# Patient Record
Sex: Female | Born: 1980 | Race: White | Hispanic: Yes | Marital: Single | Smoking: Former smoker
Health system: Southern US, Community
[De-identification: ages and names within clinical notes are randomized; demographics above are authoritative.]

## PROBLEM LIST (undated history)

## (undated) DIAGNOSIS — E785 Hyperlipidemia, unspecified: Secondary | ICD-10-CM

## (undated) DIAGNOSIS — D649 Anemia, unspecified: Secondary | ICD-10-CM

## (undated) DIAGNOSIS — D1771 Benign lipomatous neoplasm of kidney: Secondary | ICD-10-CM

## (undated) DIAGNOSIS — J302 Other seasonal allergic rhinitis: Secondary | ICD-10-CM

## (undated) HISTORY — PX: INDUCED ABORTION: SHX677

## (undated) HISTORY — DX: Hyperlipidemia, unspecified: E78.5

## (undated) HISTORY — DX: Benign lipomatous neoplasm of kidney: D17.71

## (undated) HISTORY — DX: Other seasonal allergic rhinitis: J30.2

## (undated) HISTORY — PX: DILATION AND CURETTAGE OF UTERUS: SHX78

---

## 2002-03-20 ENCOUNTER — Emergency Department: Admit: 2002-03-20 | Payer: Self-pay | Source: Emergency Department | Admitting: Emergency Medicine

## 2003-03-01 ENCOUNTER — Emergency Department: Admit: 2003-03-01 | Payer: Self-pay | Source: Emergency Department | Admitting: Emergency Medicine

## 2007-06-11 ENCOUNTER — Emergency Department: Admit: 2007-06-11 | Payer: Self-pay | Source: Emergency Department | Admitting: Emergency Medicine

## 2007-11-19 ENCOUNTER — Emergency Department: Admit: 2007-11-19 | Payer: Self-pay | Source: Emergency Department | Admitting: Emergency Medicine

## 2008-04-15 ENCOUNTER — Emergency Department: Admit: 2008-04-15 | Payer: Self-pay | Source: Emergency Department | Admitting: Emergency Medicine

## 2008-04-15 LAB — CBC AND DIFFERENTIAL
Basophils Absolute: 0 /mm3 (ref 0.0–0.2)
Basophils: 0 % (ref 0–2)
Eosinophils Absolute: 0.2 /mm3 (ref 0.0–0.7)
Eosinophils: 2 % (ref 0–5)
Granulocytes Absolute: 4.5 /mm3 (ref 1.8–8.1)
Hematocrit: 40.4 % (ref 37.0–47.0)
Hgb: 14.1 G/DL (ref 12.0–16.0)
Lymphocytes Absolute: 2.2 /mm3 (ref 0.5–4.4)
Lymphocytes: 29 % (ref 15–41)
MCH: 30.6 PG (ref 28.0–32.0)
MCHC: 34.9 G/DL (ref 32.0–36.0)
MCV: 87.6 FL (ref 80.0–100.0)
MPV: 9.2 FL — ABNORMAL LOW (ref 9.4–12.3)
Monocytes Absolute: 0.6 /mm3 (ref 0.0–1.2)
Monocytes: 8 % (ref 0–11)
Neutrophils %: 60 % (ref 52–75)
Platelets: 362 /mm3 (ref 140–400)
RBC: 4.61 /mm3 (ref 4.20–5.40)
RDW: 13 % (ref 11.5–15.0)
WBC: 7.49 /mm3 (ref 3.50–10.80)

## 2008-04-15 LAB — URINALYSIS WITH MICROSCOPIC
Blood, UA: NEGATIVE
Glucose, UA: NEGATIVE
Leukocyte Esterase, UA: NEGATIVE
Nitrite, UA: NEGATIVE
Protein, UR: NEGATIVE
Specific Gravity UA POCT: >=1.03 (ref <=1.030)
Urine pH: 5.5 (ref 5.0–8.0)
Urobilinogen, UA: 0.2

## 2008-04-15 LAB — URINE ICTOTEST: Urine Ictotest: NEGATIVE

## 2008-04-15 LAB — BASIC METABOLIC PANEL
BUN: 8 MG/DL (ref 7–21)
CO2: 27 MEQ/L (ref 22–31)
Calcium: 9.3 MG/DL (ref 8.6–10.2)
Chloride: 103 MEQ/L (ref 98–107)
Creatinine: 0.8 mg/dL (ref 0.6–1.5)
Glucose: 86 MG/DL (ref 65–110)
Potassium: 3.9 MEQ/L (ref 3.6–5.0)
Sodium: 135 MEQ/L — ABNORMAL LOW (ref 136–143)

## 2008-04-15 LAB — URINE HCG QUALITATIVE: Urine HCG Qualitative: POSITIVE

## 2008-04-15 LAB — HCG QUANTITATIVE: hCG, Quant.: 391 m[IU]/mL — ABNORMAL HIGH (ref 0–10)

## 2009-11-18 ENCOUNTER — Emergency Department: Admit: 2009-11-18 | Payer: Self-pay | Source: Emergency Department | Admitting: Emergency Medicine

## 2010-08-06 ENCOUNTER — Emergency Department
Admit: 2010-08-06 | Disposition: A | Discharge status: Home / Self Care | Payer: Self-pay | Source: Emergency Department | Admitting: Emergency Medicine

## 2011-06-08 ENCOUNTER — Emergency Department
Admit: 2011-06-08 | Discharge: 2011-06-08 | Disposition: A | Discharge status: Home / Self Care | Payer: Self-pay | Source: Emergency Department

## 2011-06-08 LAB — CBC AND DIFFERENTIAL
Basophils Absolute Automated: 0.01 10*3/uL (ref 0.00–0.20)
Basophils Automated: 0 % (ref 0–2)
Eosinophils Absolute Automated: 0.03 10*3/uL (ref 0.00–0.70)
Eosinophils Automated: 1 % (ref 0–5)
Hematocrit: 42.3 % (ref 37.0–47.0)
Hgb: 14.3 g/dL (ref 12.0–16.0)
Lymphocytes Absolute Automated: 0.86 10*3/uL (ref 0.50–4.40)
Lymphocytes Automated: 35 % (ref 15–41)
MCH: 29.3 pg (ref 28.0–32.0)
MCHC: 33.8 g/dL (ref 32.0–36.0)
MCV: 86.7 fL (ref 80.0–100.0)
MPV: 9 fL — ABNORMAL LOW (ref 9.4–12.3)
Monocytes Absolute Automated: 0.36 10*3/uL (ref 0.00–1.20)
Monocytes: 15 % — ABNORMAL HIGH (ref 0–11)
Neutrophils Absolute: 1.2 10*3/uL — ABNORMAL LOW (ref 1.80–8.10)
Neutrophils: 49 % — ABNORMAL LOW (ref 52–75)
Platelets: 242 10*3/uL (ref 140–400)
RBC: 4.88 10*6/uL (ref 4.20–5.40)
RDW: 14 % (ref 12–15)
WBC: 2.46 10*3/uL — ABNORMAL LOW (ref 3.50–10.80)

## 2011-06-08 LAB — BASIC METABOLIC PANEL
BUN: 7 mg/dL (ref 7–21)
CO2: 26 mEq/L (ref 22–31)
Calcium: 8.8 mg/dL (ref 8.6–10.2)
Chloride: 102 mEq/L (ref 98–107)
Creatinine: 0.7 mg/dL (ref 0.5–1.4)
Glucose: 101 mg/dL — ABNORMAL HIGH (ref 70–100)
Potassium: 3.9 mEq/L (ref 3.6–5.0)
Sodium: 141 mEq/L (ref 136–143)

## 2011-06-08 LAB — GFR: EGFR: >60

## 2011-06-08 LAB — URINE HCG QUALITATIVE: Urine HCG Qualitative: NEGATIVE

## 2013-05-21 ENCOUNTER — Encounter
Admission: RE | Disposition: A | Discharge status: Home / Self Care | Payer: Self-pay | Source: Ambulatory Visit | Attending: Obstetrics & Gynecology

## 2013-05-21 ENCOUNTER — Encounter: Payer: Self-pay | Admitting: Certified Registered"

## 2013-05-21 ENCOUNTER — Ambulatory Visit: Payer: PRIVATE HEALTH INSURANCE | Admitting: Obstetrics & Gynecology

## 2013-05-21 ENCOUNTER — Ambulatory Visit: Payer: PRIVATE HEALTH INSURANCE | Admitting: Certified Registered"

## 2013-05-21 ENCOUNTER — Ambulatory Visit: Payer: Self-pay

## 2013-05-21 ENCOUNTER — Ambulatory Visit
Admission: RE | Admit: 2013-05-21 | Discharge: 2013-05-21 | Disposition: A | Discharge status: Home / Self Care | Payer: PRIVATE HEALTH INSURANCE | Source: Ambulatory Visit | Attending: Obstetrics & Gynecology | Admitting: Obstetrics & Gynecology

## 2013-05-21 DIAGNOSIS — F17200 Nicotine dependence, unspecified, uncomplicated: Secondary | ICD-10-CM | POA: Insufficient documentation

## 2013-05-21 DIAGNOSIS — Z880 Allergy status to penicillin: Secondary | ICD-10-CM | POA: Insufficient documentation

## 2013-05-21 DIAGNOSIS — N871 Moderate cervical dysplasia: Secondary | ICD-10-CM

## 2013-05-21 DIAGNOSIS — N72 Inflammatory disease of cervix uteri: Secondary | ICD-10-CM

## 2013-05-21 DIAGNOSIS — R87613 High grade squamous intraepithelial lesion on cytologic smear of cervix (HGSIL): Secondary | ICD-10-CM | POA: Diagnosis present

## 2013-05-21 HISTORY — PX: LEEP LLETZ: SHX4695

## 2013-05-21 LAB — POCT PREGNANCY TEST, URINE HCG: POCT Pregnancy HCG Test, UR: NEGATIVE

## 2013-05-21 SURGERY — CONE BIOPSY, CERVIX, LEEP LLETZ
Anesthesia: Anesthesia General | Site: Pelvis | Wound class: Clean Contaminated

## 2013-05-21 MED ORDER — LIDOCAINE HCL (PF) 2 % IJ SOLN
INTRAMUSCULAR | Status: AC
Start: 2013-05-21 — End: ?
  Filled 2013-05-21: qty 5

## 2013-05-21 MED ORDER — LACTATED RINGERS IV SOLN
INTRAVENOUS | Status: DC | PRN
Start: 2013-05-21 — End: 2013-05-21

## 2013-05-21 MED ORDER — MIDAZOLAM HCL 2 MG/2ML IJ SOLN
INTRAMUSCULAR | Status: DC | PRN
Start: 2013-05-21 — End: 2013-05-21
  Administered 2013-05-21: 2 mg via INTRAVENOUS

## 2013-05-21 MED ORDER — KETOROLAC TROMETHAMINE 60 MG/2ML IM SOLN
INTRAMUSCULAR | Status: AC
Start: 2013-05-21 — End: ?
  Filled 2013-05-21: qty 2

## 2013-05-21 MED ORDER — PROPOFOL 10 MG/ML IV EMUL
INTRAVENOUS | Status: AC
Start: 2013-05-21 — End: ?
  Filled 2013-05-21: qty 20

## 2013-05-21 MED ORDER — LIDOCAINE HCL 2 % IJ SOLN
INTRAMUSCULAR | Status: DC | PRN
Start: 2013-05-21 — End: 2013-05-21
  Administered 2013-05-21: 100 mg

## 2013-05-21 MED ORDER — FENTANYL CITRATE 0.05 MG/ML IJ SOLN
INTRAMUSCULAR | Status: DC | PRN
Start: 2013-05-21 — End: 2013-05-21
  Administered 2013-05-21 (×2): 25 ug via INTRAVENOUS
  Administered 2013-05-21: 50 ug via INTRAVENOUS

## 2013-05-21 MED ORDER — PROPOFOL INFUSION 10 MG/ML
INTRAVENOUS | Status: DC | PRN
Start: 2013-05-21 — End: 2013-05-21
  Administered 2013-05-21: 40 mg via INTRAVENOUS
  Administered 2013-05-21: 200 mg via INTRAVENOUS

## 2013-05-21 MED ORDER — DEXAMETHASONE SOD PHOSPHATE PF 10 MG/ML IJ SOLN
INTRAMUSCULAR | Status: AC
Start: 2013-05-21 — End: ?
  Filled 2013-05-21: qty 1

## 2013-05-21 MED ORDER — KETOROLAC TROMETHAMINE 30 MG/ML IJ SOLN
INTRAMUSCULAR | Status: DC | PRN
Start: 2013-05-21 — End: 2013-05-21
  Administered 2013-05-21: 30 mg via INTRAVENOUS

## 2013-05-21 MED ORDER — MIDAZOLAM HCL 2 MG/2ML IJ SOLN
INTRAMUSCULAR | Status: AC
Start: 2013-05-21 — End: ?
  Filled 2013-05-21: qty 2

## 2013-05-21 MED ORDER — ONDANSETRON HCL 4 MG/2ML IJ SOLN
INTRAMUSCULAR | Status: AC
Start: 2013-05-21 — End: ?
  Filled 2013-05-21: qty 2

## 2013-05-21 MED ORDER — IODINE STRONG 5 % PO SOLN
ORAL | Status: DC | PRN
Start: 2013-05-21 — End: 2013-05-21
  Administered 2013-05-21: 5 mL via ORAL

## 2013-05-21 MED ORDER — FENTANYL CITRATE 0.05 MG/ML IJ SOLN
25.0000 ug | INTRAMUSCULAR | Status: DC | PRN
Start: 2013-05-21 — End: 2013-05-21

## 2013-05-21 MED ORDER — LIDOCAINE-EPINEPHRINE 1 %-1:100000 IJ SOLN
INTRAMUSCULAR | Status: AC
Start: 2013-05-21 — End: ?
  Filled 2013-05-21: qty 20

## 2013-05-21 MED ORDER — ACETIC ACID 5% IRRIGATION
Status: DC | PRN
Start: 2013-05-21 — End: 2013-05-21
  Administered 2013-05-21: 5 mL

## 2013-05-21 MED ORDER — ONDANSETRON HCL 4 MG/2ML IJ SOLN
INTRAMUSCULAR | Status: DC | PRN
Start: 2013-05-21 — End: 2013-05-21
  Administered 2013-05-21: 4 mg via INTRAVENOUS

## 2013-05-21 MED ORDER — GLYCOPYRROLATE 0.2 MG/ML IJ SOLN
INTRAMUSCULAR | Status: AC
Start: 2013-05-21 — End: ?
  Filled 2013-05-21: qty 1

## 2013-05-21 MED ORDER — ONDANSETRON HCL 4 MG/2ML IJ SOLN
4.0000 mg | Freq: Once | INTRAMUSCULAR | Status: DC | PRN
Start: 2013-05-21 — End: 2013-05-21

## 2013-05-21 MED ORDER — FENTANYL CITRATE 0.05 MG/ML IJ SOLN
INTRAMUSCULAR | Status: AC
Start: 2013-05-21 — End: ?
  Filled 2013-05-21: qty 2

## 2013-05-21 MED ORDER — OXYCODONE-ACETAMINOPHEN 5-325 MG PO TABS
1.0000 | ORAL_TABLET | Freq: Once | ORAL | Status: DC | PRN
Start: 2013-05-21 — End: 2013-05-21

## 2013-05-21 MED ORDER — IBUPROFEN 800 MG PO TABS
800.0000 mg | ORAL_TABLET | Freq: Four times a day (QID) | ORAL | Status: DC | PRN
Start: 2013-05-21 — End: 2013-09-16

## 2013-05-21 MED ORDER — EPHEDRINE SULFATE 50 MG/ML IJ SOLN
INTRAMUSCULAR | Status: DC | PRN
Start: 2013-05-21 — End: 2013-05-21
  Administered 2013-05-21: 5 mg via INTRAVENOUS
  Administered 2013-05-21: 10 mg via INTRAVENOUS

## 2013-05-21 MED ORDER — FERRIC SUBSULFATE 259 MG/GM EX SOLN
CUTANEOUS | Status: DC | PRN
Start: 2013-05-21 — End: 2013-05-21
  Administered 2013-05-21: 6 mL via TOPICAL

## 2013-05-21 MED ORDER — LACTATED RINGERS IV SOLN
INTRAVENOUS | Status: DC
Start: 2013-05-21 — End: 2013-05-21

## 2013-05-21 MED ORDER — DEXAMETHASONE SODIUM PHOSPHATE 4 MG/ML IJ SOLN (WRAP)
INTRAMUSCULAR | Status: DC | PRN
Start: 2013-05-21 — End: 2013-05-21
  Administered 2013-05-21: 8 mg via INTRAVENOUS

## 2013-05-21 MED ORDER — HYDROMORPHONE HCL PF 1 MG/ML IJ SOLN
0.5000 mg | INTRAMUSCULAR | Status: DC | PRN
Start: 2013-05-21 — End: 2013-05-21

## 2013-05-21 MED ORDER — LIDOCAINE-EPINEPHRINE 1 %-1:100000 IJ SOLN
INTRAMUSCULAR | Status: DC | PRN
Start: 2013-05-21 — End: 2013-05-21
  Administered 2013-05-21: 5 mL

## 2013-05-21 MED ORDER — FERRIC SUBSULFATE 259 MG/GM EX SOLN
CUTANEOUS | Status: AC
Start: 2013-05-21 — End: ?
  Filled 2013-05-21: qty 8000

## 2013-05-21 MED ORDER — PROMETHAZINE HCL 25 MG/ML IJ SOLN
6.2500 mg | Freq: Once | INTRAMUSCULAR | Status: DC | PRN
Start: 2013-05-21 — End: 2013-05-21

## 2013-05-21 SURGICAL SUPPLY — 55 items
CATH URETHRAL RED RUBBER 16F (Catheter Urine) IMPLANT
CONTAINER SPECIMEN 4 OZ STRL (Procedure Accessories) ×2 IMPLANT
DCNTR FLD TRNSF DEV STRL LF DISP (IV Supply) ×1
DECANTER FLD LF STRL TRNSF DEV DISP (IV Supply) ×1
DECANTER FLUID TRANSFER DEVICE DISPOSABLE STERILE LATEX FREE (IV Supply) ×1 IMPLANT
DRESSING TELFA 3X8IN STERILE (Dressing) ×2 IMPLANT
ELECTRODE ELECTROSURGICAL BALL L11 CM (Cautery) ×1
ELECTRODE ELECTROSURGICAL BALL L11 CM OD5 MM ODSEC3/32 IN UTAHBALL (Cautery) ×1 IMPLANT
ELECTRODE ELECTROSURGICAL BLADE PENCIL (Cautery) ×1
ELECTRODE ELECTROSURGICAL BLADE PENCIL L10 FT VALLEYLAB E2515H (Cautery) ×1 IMPLANT
ELECTRODE ELECTROSURGICAL D1 CM (Cautery)
ELECTRODE ELECTROSURGICAL D1 CM UNIVERSAL SQUARE L12 CM X W1 CM OD3/32 (Cautery) IMPLANT
ELECTRODE ELECTROSURGICAL ROUND LOOP L10 (Instrument)
ELECTRODE ELECTROSURGICAL ROUND LOOP L10 MM X W10 MM OD3/32 IN (Instrument) IMPLANT
ELECTRODE ELECTROSURGICAL ROUND LOOP L11 (Cautery) ×1
ELECTRODE ELECTROSURGICAL ROUND LOOP L11 CM OD8 MM UTAHLOOP (Cautery) IMPLANT
ELECTRODE ELECTROSURGICAL ROUND LOOP L12 (Cautery)
ELECTRODE ELECTROSURGICAL ROUND LOOP L12 MM X W15 MM OD3/32 IN (Cautery) IMPLANT
ELECTRODE ELECTROSURGICAL ROUND LOOP L12 MM X W20 MM OD3/32 IN (Cautery) IMPLANT
ELECTRODE ESURG RND LOOP UTAHLOOP SFTGG (Cautery) ×1
ELECTRODE ESURG SS BALL UTAHBALL 5MM (Cautery) ×1
ELECTRODE ESURG SS BLDE PNCL VLAB 10FT (Cautery) ×1
ELECTRODE ESURG SS TUNG PLS RND LOOP (Cautery)
ELECTRODE ESURG SS TUNG PLS RND LOOP (Instrument)
ELECTRODE ESURG SS TUNG RND LOOP (Cautery)
ELECTRODE ESURG TUNG 1CM UNV SQ 3/32IN (Cautery)
GLOVE SURG BIOGEL INDIC SZ 6.5 (Glove) ×2 IMPLANT
GLOVE SURG BIOGEL PF LTX SZ6.0 (Glove) ×2 IMPLANT
GOWN OPTIMA STRL BACK OR (Gown) ×2 IMPLANT
KIT SURG INCL NEEDLE CN (Kits) ×2 IMPLANT
NEEDLE SPINAL BD OD22 GA L3 1/2 IN (Needles) ×1
NEEDLE SPINAL L3 1/2 IN REGULAR WALL QUINCKE TIP OD22 GA BD (Needles) ×1 IMPLANT
NEEDLE SPNL PP RW BD QNCK 22GA 3.5IN LF (Needles) ×1
PACK LITHOTOMY (Pack) ×2 IMPLANT
PAD ELECTROSRG GRND REM W CRD (Procedure Accessories) ×2 IMPLANT
PAD PREP CUFF 24X41IN W 9IN (Prep) ×2 IMPLANT
PAD SANITARY L12.25 IN X W4.25 IN HEAVY ABSORBENT MOISTURE BARRIER (Dressing) ×1 IMPLANT
PAD SNTR SLK FLF CRTY 12.25X4.25IN LF NS (Dressing) ×2
SPONGE SRG VISTEC 8X4IN LF STRL 12 PLY (Sponge) ×1
SPONGE SURGICAL L8 IN X W4 IN 12 PLY (Sponge) ×1
SPONGE SURGICAL L8 IN X W4 IN 12 PLY RADIOPAQUE BAND VISTEC BLUE WHITE (Sponge) ×1 IMPLANT
SWAB CULT PLS RYN 8IN LF STRL CSTM (Applicator) ×2
SWAB CULTURE L8 IN CUSTOM PLASTIC RAYON (Applicator) ×2
SWAB CULTURE L8 IN CUSTOM PROCTOSWAB PLASTIC RAYON PROCTOSCOPE (Applicator) ×2 IMPLANT
SYRINGE 10 ML CONTROL CONCENTRIC TIP (Syringes, Needles) ×1
SYRINGE 10 ML CONTROL CONCENTRIC TIP PYROGEN FREE DEHP FREE LOK (Syringes, Needles) ×1 IMPLANT
SYRINGE MED 10ML LL LF STRL CNTRL CONC (Syringes, Needles) ×1
TOWEL L26 IN X W17 IN COTTON PREWASH (Procedure Accessories) ×2
TOWEL L26 IN X W17 IN COTTON PREWASH DELINT BLUE ACTISORB SURGICAL (Procedure Accessories) ×1 IMPLANT
TOWEL SRG CTTN 26X17IN LF STRL PREWASH (Procedure Accessories) ×2
TRAY SKIN BETANDINE PREP (Tray) ×2 IMPLANT
TUBING LASER NONSTERILE 6FT (Ortho Supply) ×1 IMPLANT
TUBING SCT MDVC MXGR 9/32IN 12FT LF STRL (Suction) ×1
TUBING SUCTION ID9/32 IN L12 FT (Suction) ×1
TUBING SUCTION ID9/32 IN L12 FT NONCONDUCTIVE MALE TO MALE CONNECTOR (Suction) ×1 IMPLANT

## 2013-05-21 NOTE — Op Note (Signed)
Operative Report    Date of Procedure:  05/21/2013    Patient Name:  Judy Aguirre    Pre-Op Dx:    1. ASCUS papsmear with + HR HPV  2. 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 ASCUS papsmear with + HR HPV  2. 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         Procedure:  After informed consent was obtained, the patient was taken to the operating room where anesthesia was obtained without difficulty.  She was draped in the usual fashion in the dorsal lithotomy position in Manele stirrups.  A bivalve speculum was placed in the vagina and Lugol's solution was painted on the cervix with findings as stated above.  1%lidocaine with epinerphrine was injected submucosally into the surface of the cervix (ectocervix) at the 2, 4, 8, and 10 o'clock positions.   Loop electro-surgical excision procedure was then performed, followed by ECC sampling. Hemostasis was obtained with the Ball cautery and Monsel's solution.  All instruments were removed from the vagina.  The patient tolerated the procedure well.  All instrument, sponge, and needle counts were correct times two.  She was taken to the recovery room in stable [place] [id number]

## 2013-05-21 NOTE — H&P (Signed)
PRE-OP HISTORY AND PHYSICAL EXAM    Date Time: 05/21/2013 2:53 PM  Patient Name: Judy Aguirre  Attending Physician: Jeanie Cooks, MD    CC: CIN II colpo    History of Presenting Illness:   Judy Aguirre is a 33 y.o. female who presents to the hospital for scheduled LEEP. Patient     Past Medical History:     Past Medical History   Diagnosis Date   . Abnormal vision      wears glasses       Past Surgical History:     Past Surgical History   Procedure Date   . Vaginal delivery 2000   . Induced abortion      x2       OB History:     LMP: Nov 2014  G:3 P:1021  Sab: 0 Tab:1 BC/HRT: condoms for contraception (notes using condoms consistently)      Medications / Herbals / OTC:     No prescriptions prior to admission       Allergies:     Allergies   Allergen Reactions   . Penicillins Other (See Comments)     Was told by parent she was allergic as child         Psychosocial / Family History:     History     Social History   . Marital Status: Single     Spouse Name: N/A     Number of Children: N/A   . Years of Education: N/A     Social History Main Topics   . Smoking status: Current Some Day Smoker -- 3 years   . Smokeless tobacco: Not on file   . Alcohol Use: Yes      Comment: 2-3 times a week-3-4 drinks   . Drug Use: Yes      Comment: marijuana once a month   . Sexually Active:      Other Topics Concern   . Not on file     Social History Narrative   . No narrative on file       History reviewed. No pertinent family history.      Physical Exam:     Filed Vitals:    05/21/13 1213   BP: 101/57   Pulse: 71   Temp: 97.6 F (36.4 C)   Resp: 16   SpO2: 100%       Gen: NAD  Lungs: CTAB  Cardiac: normal rate, regular rhythm  Abdomen: soft, nontender      Assessment:   32yo [id number] with ASCUS papsmear/+ HPV with CIN II colposcopy.    Plan:   - discussed LEEP procedure including indication, anticipated course, risks and alternatives to the proposed surgery, including the consequences of not having the surgery.   -Good  understanding of these considerations has been demonstrated and she wishes to proceed.    - Consent signed and in chart.      Signed by: Jeanie Cooks, MD

## 2013-05-21 NOTE — Discharge Summary (Signed)
Patient underwent LEEP procedure without any complications.

## 2013-05-21 NOTE — Progress Notes (Signed)
Report given to Danielle to transport pt to phase 2.

## 2013-05-21 NOTE — Anesthesia Preprocedure Evaluation (Signed)
Anesthesia Evaluation    AIRWAY    Mallampati: II    TM distance: >3 FB  Neck ROM: full  Mouth Opening:full   CARDIOVASCULAR    cardiovascular exam normal, regular and normal       DENTAL    No notable dental hx     PULMONARY    pulmonary exam normal and clear to auscultation     OTHER FINDINGS    Patient seen and evaluated.  No significant past medical history.  No previous problems with anesthesia.          PSS Anesthesia Comments:          Anesthesia Plan    ASA 1     general               (Discussed with patient GA either IV or with LMA/ETT as indicated with risk to include but not limited to N/V, H/A, sore throat.  Questions answered. )      intravenous induction   Detailed anesthesia plan: general LMA and general endotracheal  Monitors/Adjuncts: other (BP cuff, pulse oximeter, EKG, ETCO2)    Post Op: other (PACU)  Post op pain management: per surgeon    informed consent obtained    Plan discussed with CRNA.      pertinent labs reviewed

## 2013-05-21 NOTE — Discharge Instructions (Signed)
Discharge Instructions for LEEP  Your doctor performed a loop electrosurgical excision procedure (LEEP). The reason for having this procedure is to remove abnormal cells from the cervix.   Home Care   Take it easy.    Return to your normal activities after 24-48 hours. You may also return to work at that time.   Eat a normal diet.   Take prescribed pain reliever for pain, if needed.   Remember, it's okay to have vaginal discharge (watery or brown) and light bleeding for about 2-3 weeks after the procedure.    Don't lift anything heavier than 10 pounds for 1 week after the procedure.   Don't drive for 24 hours after the procedure.   Don't have sexual intercourse or use tampons or douches until your doctor says it's safe to do so. This usually takes 2 weeks.  Follow-Up   Make a follow-up appointment in 2 weeks.    When to Call Your Doctor  Call your doctor immediately if you have any of the following:   Bleeding that soaks more than one sanitary pad in one hour   Severe abdominal pain   Severe cramps   Fever above 100.44F   Chills   Smelly discharge from your vagina    [address], [address] [place], [place] [address]. All rights reserved. This information is not intended as a substitute for professional medical care. Always follow your healthcare professional's instructions.      Post Anesthesia Discharge Instructions    Although you may be awake and alert in the recovery room, small amounts of anesthetic remain in your system for about 24 hours.  You may feel tired and sleepy during this time.      You are advised to go directly home from the hospital.    Plan to stay at home and rest for the remainder of the day.    It is advisable to have someone with you at home for 24 hours after surgery.    Do not operate a motor vehicle, or any mechanical or electrical equipment for the next 24 hours.      Be careful when you are walking around, you may become dizzy.  The effects of  anesthesia and/or medications are still present and drowsiness may occur    Do not consume alcohol, tranquilizers, sleeping medications, or any other non prescribed medication for the remainder of the day.    Diet:  begin with liquids, progress your diet as tolerated or as directed by your surgeon.  Nausea and vomiting may occur in the next 24 hours.

## 2013-05-21 NOTE — Transfer of Care (Signed)
Anesthesia Transfer of Care Note    Patient: Judy Aguirre    Procedures performed: Procedure(s) with comments:  LEEP LLETZ    Anesthesia type: General LMA    Patient location:Phase I PACU    Last vitals:   Filed Vitals:    05/21/13 1619   BP: 107/58   Pulse: 95   Temp: 97.8 F (36.6 C)   Resp: 12   SpO2: 100%       Post pain: Patient not complaining of pain, continue current therapy      Mental Status:lethargic    Respiratory Function: tolerating nasal cannula    Cardiovascular: stable    Nausea/Vomiting: patient not complaining of nausea or vomiting    Hydration Status: adequate    Post assessment: no apparent anesthetic complications, no reportable events and no evidence of recall    Report to rn, vss

## 2013-05-21 NOTE — Anesthesia Postprocedure Evaluation (Signed)
Anesthesia Post Evaluation    Patient: Judy Aguirre    Procedures performed: Procedure(s) with comments:  LEEP LLETZ    Anesthesia type: General LMA    Patient location:PACU    Last vitals:   Filed Vitals:    05/21/13 1635   BP: 116/64   Pulse: 97   Temp:    Resp: 17   SpO2: 100%       Post pain: Patient not complaining of pain, continue current therapy      Mental Status:awake    Respiratory Function: tolerating room air    Cardiovascular: stable    Nausea/Vomiting: patient not complaining of nausea or vomiting    Hydration Status: adequate    Post assessment: no apparent anesthetic complications

## 2013-05-22 ENCOUNTER — Encounter: Payer: Self-pay | Admitting: Obstetrics & Gynecology

## 2013-05-22 MED FILL — Ferric Subsulfate Soln 259 MG/GM: CUTANEOUS | Qty: 8000 | Status: AC

## 2013-05-23 LAB — LAB USE ONLY - HISTORICAL SURGICAL PATHOLOGY

## 2013-09-16 ENCOUNTER — Emergency Department: Payer: Charity

## 2013-09-16 ENCOUNTER — Emergency Department
Admission: EM | Admit: 2013-09-16 | Discharge: 2013-09-16 | Disposition: A | Discharge status: Home / Self Care | Payer: Charity | Attending: Emergency Medicine | Admitting: Emergency Medicine

## 2013-09-16 DIAGNOSIS — J40 Bronchitis, not specified as acute or chronic: Secondary | ICD-10-CM | POA: Insufficient documentation

## 2013-09-16 LAB — GROUP A STREP, RAPID ANTIGEN: Group A Strep, Rapid Antigen: NEGATIVE

## 2013-09-16 MED ORDER — BENZONATATE 100 MG PO CAPS
200.0000 mg | ORAL_CAPSULE | Freq: Three times a day (TID) | ORAL | Status: DC | PRN
Start: 2013-09-16 — End: 2015-12-09

## 2013-09-16 MED ORDER — GUAIFENESIN-CODEINE 100-10 MG/5ML PO SYRP
5.0000 mL | ORAL_SOLUTION | Freq: Three times a day (TID) | ORAL | Status: DC | PRN
Start: 2013-09-16 — End: 2015-12-09

## 2013-09-16 MED ORDER — IBUPROFEN 600 MG PO TABS
600.0000 mg | ORAL_TABLET | Freq: Four times a day (QID) | ORAL | Status: DC | PRN
Start: 2013-09-16 — End: 2015-12-09

## 2013-09-16 MED ORDER — IBUPROFEN 600 MG PO TABS
600.0000 mg | ORAL_TABLET | Freq: Once | ORAL | Status: AC
Start: 2013-09-16 — End: 2013-09-16
  Administered 2013-09-16: 600 mg via ORAL
  Filled 2013-09-16: qty 1

## 2013-09-16 MED ORDER — LIDOCAINE VISCOUS 2 % MT SOLN
10.0000 mL | Freq: Once | OROMUCOSAL | Status: AC
Start: 2013-09-16 — End: 2013-09-16
  Administered 2013-09-16: 10 mL via OROMUCOSAL
  Filled 2013-09-16: qty 15

## 2013-09-16 NOTE — Discharge Instructions (Signed)
Bronchitis    You have been diagnosed with bronchitis.    Bronchitis is an irritation of the large breathing tubes. It can be caused by tobacco smoke, air pollution, or an infection. Patients with bronchitis are short of breath and may cough up green or yellow mucous. These symptoms are usually worse at night, when lying flat and in wet weather. Most people with bronchitis do not need antibiotics. If your doctor prescribes antibiotics, fill the prescription and take all the medicine according to the instructions.    Bronchitis is usually treated with medicine to help stop coughing. An inhaler with albuterol (Ventolin/Proventil) is sometimes used to help with cough. It is best to use the inhaler with a spacer to help the medicine reach your lungs. The doctor can prescribe a spacer.    Bronchitis is usually caused by a virus. Antibiotics do not kill viruses. In fact, antibiotics do not affect viruses in any way. In the past, some doctors prescribed antibiotics for people with bronchitis. We now know that antibiotics are not helpful for most bronchitis patients. Patients who might need antibiotics are those with lung problems that don't go away, like emphysema or COPD.    Your coughing and wheezing might last for 2 or 3 weeks! The symptoms should get better over this time period and not worse.    Do not smoke. Research shows that smoking causes heart disease, cancer, and birth defects. Avoiding smoking will help your asthma. If you smoke, ask your doctor for ideas about how to stop.   If you do not smoke, avoid others who do.    YOU SHOULD SEEK MEDICAL ATTENTION IMMEDIATELY, EITHER HERE OR AT THE NEAREST EMERGENCY DEPARTMENT, IF ANY OF THE FOLLOWING OCCURS:   You wheeze or have trouble breathing.   You have a fever (temperature higher than 100.4F / 38C), that won't go away.   You have chest pain.   You vomit or cannot keep liquids down or you feel weak or dizzy.   Your symptoms get worse over the next  2 or 3 days.

## 2013-09-16 NOTE — ED Provider Notes (Signed)
EMERGENCY DEPARTMENT HISTORY AND PHYSICAL EXAM     Physician/Midlevel provider first contact with patient: 09/16/13 2032         Date: 09/16/2013  Patient Name: Judy Aguirre    History of Presenting Illness     Chief Complaint   Patient presents with   . Cough   . Sore Throat       History Provided By: Patient    Chief Complaint: Cough  Onset: 1 week  Timing: Gradually Worsening  Quality: Productive cough with yellow sputum  Severity: Moderate  Modifying Factors: Urticaria after OTC PM cough suppressant   Associated Symptoms: Rhinorrhea, HA, Sore Throat, Myalgias, Decreased Appetite, Urticaria (earlier, resolved since)    Additional History: Judy Aguirre is a 33 y.o. female patient presenting with productive cough x 1 week.  Patient reports taking OTC PM cough suppressant with allergic reaction of urticaria to it.  She doesn't have urticaria symptoms presently.  Other associated symptoms include rhinorrhea, HA, sore throat, myalgias, and some decreased appetite.  Denies Ear Pain, Fever    PCP: Pcp, Noneorunknown, MD      No current facility-administered medications for this encounter.     Current Outpatient Prescriptions   Medication Sig Dispense Refill   . benzonatate (TESSALON PERLES) 100 MG capsule Take 2 capsules (200 mg total) by mouth 3 (three) times daily as needed for Cough.  20 capsule  0   . guaiFENesin-codeine (ROBITUSSIN AC) 100-10 MG/5ML syrup Take 5 mLs by mouth 3 (three) times daily as needed for Cough.  120 mL  0   . ibuprofen (ADVIL,MOTRIN) 600 MG tablet Take 1 tablet (600 mg total) by mouth every 6 (six) hours as needed for Pain or Fever.  30 tablet  0       Past History     Past Medical History:  Past Medical History   Diagnosis Date   . Abnormal vision      wears glasses       Past Surgical History:  Past Surgical History   Procedure Laterality Date   . Vaginal delivery  2000   . Induced abortion       x2   . Leep lletz  05/21/2013     Procedure: LEEP LLETZ;  Surgeon: Jeanie Cooks, MD;   Location: [facility];  Service: Gynecology;  Laterality: N/A;       Family History:  History reviewed. No pertinent family history.    Social History:  History   Substance Use Topics   . Smoking status: Current Some Day Smoker -- 3 years   . Smokeless tobacco: Not on file   . Alcohol Use: Yes      Comment: 2-3 times a week-3-4 drinks       Allergies:  Allergies   Allergen Reactions   . Penicillins Other (See Comments)     Was told by parent she was allergic as child   . Tylenol Pm Extra Strength [Diphenhydramine-Acetaminophen]        Review of Systems       Constitutional: Negative for fever or chills. + Decreased Appetite  Neurological: Negative for speech changes, weakness, or numbness.  Eyes: Negative for visual changes or eye pain.  HENT: Negative for neck pain. + Rhinorrhea, HA, Sore Throat  Cardiovascular: Negative for chest pain.   Respiratory: Negative for shortness of breath. + Productive cough with yellow sputum  Gastrointestinal: Negative for abdominal pain, nausea, vomiting, diarrhea, or blood in stool.   Genitourinary: Negative  for dysuria or hematuria.  Musculoskeletal: Negative for gait changes or joint pain. + Myalgias  Skin: Negative for itching. + Urticaria (Earlier, resolved since)  Hematological: Negative for easy bruising    Physical Exam   BP 113/85   Pulse 78   Temp(Src) 98 F (36.7 C) (Oral)   Resp 18   Wt 73.483 kg   SpO2 98%       Physical Exam   Constitutional: Oriented to person, place, and time and well-developed, well-nourished, and in no distress.   Head: Normocephalic and atraumatic.   Mouth/Throat: Mildly erythematous posterior oropharynx   Eyes: Conjunctivae normal and EOM are normal. Pupils are equal, round, and reactive to light.    Neck: Normal range of motion. Neck supple. No thyromegaly present.   Cardiovascular: Normal rate, regular rhythm, normal heart sounds and intact distal pulses.  No murmur heard.  Pulmonary/Chest: Effort normal and breath sounds normal.    Abdominal: Soft. Non distended. Non tender. No rebound or guarding  Musculoskeletal: No peripheral edema. No calf swelling or tenderness.    Neurological: Patient is alert and oriented to person, place, and time. No cranial nerve deficit. Gait normal. GCS score is 15.   Skin: Skin is warm and dry. No rash  Psychiatric: Affect normal.       Diagnostic Study Results     Labs -     Results    Procedure Component Value Units Date/Time    Rapid Strep [[id number]] Collected:  09/16/13 2001    Specimen Information:  Throat Updated:  09/16/13 2014     Group A Strep, Rapid Antigen Negative           Radiologic Studies -   Radiology Results (24 Hour)    Procedure Component Value Units Date/Time    Chest 2 Views [[id number]] Collected:  09/16/13 2113    Order Status:  Completed  Updated:  09/16/13 2117    Narrative:      History: cough    Technique: PA and Lateral    Comparison: 08/06/2010    Findings:  The lungs appear clear.  There is no pneumothorax.  The heart is normal in size.    The mediastinum is within normal limits.             Impression:       No active disease is seen in the chest.    Judy Slimmer, MD   09/16/2013 9:13 PM        .      Medical Decision Making   I am the first provider for this patient.    I reviewed the vital signs, available nursing notes, past medical history, past surgical history, family history and social history.    Vital Signs-Reviewed the patient's vital signs.     Patient Vitals for the past 12 hrs:   BP Temp Pulse Resp   09/16/13 1940 113/85 mmHg 98 F (36.7 C) 78 18       Pulse Oximetry Analysis - Normal 98% on RA    Old Medical Records: Nursing notes.     ED Course:   9:24 PM - The patient feels better. Patient is aware of results. Patient is amenable to discharge.  Discussed with patient about discharge instructions including follow up with [facility] if no improvement.  Discussed return precautions. Patient expresses understanding and agrees with plan.  All questions  and concerns were addressed.     Provider Notes: likely bronchitis. Non toxic appearing.  Normal vitals. Lungs are clear. Will d/c home with motrin, guaifenesin and tessalon pearls for comfort.       Diagnosis     Clinical Impression:   1. Bronchitis        Treatment Plan:   ED Disposition    Discharge [facility]OCK discharge to home/self care.    Condition at disposition: Stable            _______________________________    Attestations:  This note is prepared by Davina Poke and Latrelle Dodrill, acting as scribes for Dr. Georgeanna Harrison A. Maisie Fus, MD.    Dr. Georgeanna Harrison A. Maisie Fus, MD - The scribe's documentation has been prepared under my direction and personally reviewed by me in its entirety.  I confirm that the note above accurately reflects all work, treatment, procedures, and medical decision making performed by me.    _______________________________                Cherlyn Roberts, MD  09/22/13 [phone]

## 2013-09-16 NOTE — ED Notes (Signed)
Productive cough ( yellow sputum) and sore throat since 1 week.Started taking OTCTylenol PM 2 days ago,ahad allergic reaction to it,rashes and hives all over the body.No rashes or hives noted on triage.

## 2015-07-28 ENCOUNTER — Emergency Department: Payer: Self-pay

## 2015-07-28 ENCOUNTER — Emergency Department
Admission: EM | Admit: 2015-07-28 | Discharge: 2015-07-28 | Disposition: A | Discharge status: Home / Self Care | Payer: Charity | Attending: Emergency Medicine | Admitting: Emergency Medicine

## 2015-07-28 DIAGNOSIS — N941 Unspecified dyspareunia: Secondary | ICD-10-CM | POA: Insufficient documentation

## 2015-07-28 DIAGNOSIS — R1030 Lower abdominal pain, unspecified: Secondary | ICD-10-CM | POA: Insufficient documentation

## 2015-07-28 DIAGNOSIS — K5900 Constipation, unspecified: Secondary | ICD-10-CM

## 2015-07-28 LAB — URINALYSIS, REFLEX TO MICROSCOPIC EXAM IF INDICATED
Bilirubin, UA: NEGATIVE
Blood, UA: NEGATIVE
Glucose, UA: NEGATIVE
Ketones UA: NEGATIVE
Leukocyte Esterase, UA: NEGATIVE
Nitrite, UA: NEGATIVE
Protein, UR: 30 — AB
Specific Gravity UA: 1.025 (ref 1.001–1.035)
Urine pH: 6 (ref 5.0–8.0)
Urobilinogen, UA: NORMAL mg/dL

## 2015-07-28 LAB — URINE HCG QUALITATIVE: Urine HCG Qualitative: NEGATIVE

## 2015-07-28 MED ORDER — FLUCONAZOLE 100 MG PO TABS
150.0000 mg | ORAL_TABLET | Freq: Once | ORAL | Status: AC
Start: 2015-07-28 — End: 2015-07-28
  Administered 2015-07-28: 150 mg via ORAL
  Filled 2015-07-28: qty 2

## 2015-07-28 MED ORDER — SENNA 8.6 MG PO TABS
8.6000 mg | ORAL_TABLET | Freq: Every evening | ORAL | Status: DC
Start: 2015-07-28 — End: 2015-12-09

## 2015-07-28 NOTE — ED Provider Notes (Addendum)
EMERGENCY DEPARTMENT HISTORY AND PHYSICAL EXAM     Physician/Midlevel provider first contact with patient: 07/28/15 1504         Date: 07/28/2015  Patient Name: Judy Aguirre    History of Presenting Illness     Chief Complaint   Patient presents with   . Abdominal Pain     History Provided By: Patient    Chief Complaint: Pelvic pain   Onset: 2.5 weeks  Timing: Intermittent  Location: pelvis  Quality: ache/discomfort  Severity: 8/10 in severity  Exacerbating factors: sexual intercourse  Alleviating factors: none  Associated Symptoms: constipation, pain with sexual intercourse  Pertinent Negatives: dysuria, vomiting     Additional History: Judy Aguirre is a 35 y.o. female w/ no significant pmhx presenting to the ED with a 2.5 week hx of intermittent pelvic pain. She also c/o constipation for the past 5 days. She describes the pelvic pain as sharp and 8/10 in pain severity. The pelvic pain radiates to her perineum and is exacerbated with sexual intercourse painful. She denies dysuria, vomiting or a pmhx of abd surgeries. About 3 weeks ago she experienced unusually heavy menstruation that has since ceased.    Pt also reports that for the past month her boyfriend had "scratch-like" irritations to his penis skin and he was dx w/ a penile yeast infection. He has taken a rx pill to kill the yeast but continues to have the same irritation. She went to her GYN and had a full STD panel done w/ a pelvic exam that were all normal.    Patient's last menstrual period was 06/18/2015.    PCP: Pcp, Noneorunknown, MD    No current facility-administered medications for this encounter.     Current Outpatient Prescriptions   Medication Sig Dispense Refill   . benzonatate (TESSALON PERLES) 100 MG capsule Take 2 capsules (200 mg total) by mouth 3 (three) times daily as needed for Cough. 20 capsule 0   . guaiFENesin-codeine (ROBITUSSIN AC) 100-10 MG/5ML syrup Take 5 mLs by mouth 3 (three) times daily as needed for  Cough. 120 mL 0   . ibuprofen (ADVIL,MOTRIN) 600 MG tablet Take 1 tablet (600 mg total) by mouth every 6 (six) hours as needed for Pain or Fever. 30 tablet 0   . senna (SENOKOT) 8.6 MG Tab Take 1 tablet (8.6 mg total) by mouth nightly. 4 tablet 0       Past History     Past Medical History:  Past Medical History   Diagnosis Date   . Abnormal vision      wears glasses       Past Surgical History:  Past Surgical History   Procedure Laterality Date   . Vaginal delivery  2000   . Induced abortion       x2   . Leep lletz  05/21/2013     Procedure: LEEP LLETZ;  Surgeon: Jeanie Cooks, MD;  Location: [facility];  Service: Gynecology;  Laterality: N/A;       Family History:  No family history on file.    Social History:  Social History   Substance Use Topics   . Smoking status: Current Some Day Smoker -- 3 years   . Smokeless tobacco: None   . Alcohol Use: Yes      Comment: 2-3 times a week-3-4 drinks       Allergies:  Allergies   Allergen Reactions   . Penicillins Other (See Comments)     Was told  by parent she was allergic as child   . Tylenol Pm Extra Strength [Diphenhydramine-Acetaminophen]        Review of Systems     Review of Systems   Constitutional: Negative for fever and fatigue.   HENT: Negative for rhinorrhea and sore throat.    Eyes: Negative for discharge, redness and visual disturbance.   Respiratory: Negative for cough and shortness of breath.    Cardiovascular: Negative for chest pain and leg swelling.   Gastrointestinal: Positive for constipation. Negative for nausea, vomiting and abdominal pain.   Endocrine: Negative for polyuria.   Genitourinary: Positive for pelvic pain. Negative for dysuria, urgency, frequency and flank pain.   Musculoskeletal: Negative for back pain, neck pain and neck stiffness.   Skin: Negative for rash.   Allergic/Immunologic: Negative for immunocompromised state.        Allergies: pcn and tylenol   Neurological: Negative for light-headedness and headaches.   Hematological:  Does not bruise/bleed easily.   Psychiatric/Behavioral: Negative for suicidal ideas.     Physical Exam   BP 129/74 mmHg  Pulse 68  Temp(Src) 98.1 F (36.7 C) (Oral)  Resp 14  Ht 5\' 7"  (1.702 m)  Wt 68.04 kg  BMI 23.49 kg/m2  SpO2 100%  LMP 06/18/2015    Constitutional: Vital signs reviewed. Well appearing. No distress.  Head: Normocephalic, atraumatic  Eyes: Conjunctiva and sclera are normal.  No injection or discharge.  Ears, Nose, Throat:  Normal external examination of the nose and ears.  Mucous membranes moist.  Neck: Normal range of motion. Supple, no meningeal signs. Trachea midline.  Respiratory/Chest: Clear to auscultation. No respiratory distress.   Cardiovascular: Regular rate and rhythm. No murmurs.  Abdomen:  Bowel sounds intact. No rebound or guarding. Soft.  Non-tender.  GU: Normal external genitalia. No rash or traumatic lesions. No external tenderness or perineal tenderness. No cervical erythema. (+) cheesy white discharge in the vagina. No CMT or adnexal mass or tenderness.   Back: No cva tenderness to percussion.  Upper Extremity:  No edema. No cyanosis. Bilateral radial pulses intact and equal.   Lower Extremity:  No edema. No cyanosis. Bilateral calves symmetrical and non-tender.   Skin: Warm and dry. No rash.  Neuro: A&Ox3. Moves all extremities spontaneously. Normal gait.   Psychiatric: Normal affect.  Normal insight.      Diagnostic Study Results     Labs -     Results     Procedure Component Value Units Date/Time    Wet prep trichomonas [[id number]] Collected:  07/28/15 1614    Specimen Information:  Cervical Swab Updated:  07/28/15 1643    Narrative:      ORDER#: [id number]                                    ORDERED BY: Lucianne Muss, Alsie Younes  SOURCE: Cervical Swab                                COLLECTED:  07/28/15 16:14  ANTIBIOTICS AT COLL.:                                RECEIVED :  07/28/15 16:20  Wet Prep Trichomonas  FINAL       07/28/15 16:43  07/28/15   No  Trichomonas or Yeast Seen             Reference Range: No Trichomonas or Yeast Seen      Chlamydia/GC by PCR [[id number]] Collected:  07/28/15 1614    Specimen Information:  Vaginal Swab - Clinician Collected Updated:  07/28/15 1614    Narrative:      Call Lab first    UA, Reflex to Microscopic [[id number]]  (Abnormal) Collected:  07/28/15 1510    Specimen Information:  Urine Updated:  07/28/15 1526     Urine Type Clean Catch      Color, UA Yellow      Clarity, UA Slightly Cloudy      Specific Gravity UA 1.025      Urine pH 6.0      Leukocyte Esterase, UA Negative      Nitrite, UA Negative      Protein, UR 30 (A)      Glucose, UA Negative      Ketones UA Negative      Urobilinogen, UA Normal mg/dL      Bilirubin, UA Negative      Blood, UA Negative      RBC, UA 0-5 /hpf      WBC, UA 0-5 /hpf      Squamous Epithelial Cells, Urine 6-10 /hpf     Beta HCG, Qual, Urine [[id number]] Collected:  07/28/15 1510    Specimen Information:  Urine Updated:  07/28/15 1522     Urine HCG Qualitative Negative           Radiologic Studies -   Radiology Results (24 Hour)     ** No results found for the last 24 hours. **      .    Medical Decision Making   I am the first provider for this patient.    I reviewed the vital signs, available nursing notes, past medical history, past surgical history, family history and social history.    Vital Signs-Reviewed the patient's vital signs.     Patient Vitals for the past 12 hrs:   BP Temp Pulse Resp   07/28/15 1647 129/74 mmHg - 68 14   07/28/15 1451 129/73 mmHg 98.1 F (36.7 C) 83 18       Pulse Oximetry Analysis - Normal 98% on RA    Old Medical Records: Old medical records.  Nursing notes.     ED Course:   3:09 PM - Discussed plan for pelvic exam w/ UA, pregnancy test and STD screening, pt agreeable.    4:10 PM - Pelvic exam done w/ [employer] RN supervising. Pt in NAD with no pain on exam. Abdomen remains soft and non-tender and tolerating PO.  Discussed results with pt. Counseled on f/u  plans w/ GYN and medication use. Return precautions reviewed, pt expresses understanding.    Provider Notes: No abdominal tenderness on exam. Pt appears comfortable. (+) thick whitish discharge in vagina consistent with yeast infection, given dose of diflucan. STD studies sent. Doubt bowel obstruction, acute appendicitis, ovarian torsion, or other surgical emergency. Will f/u closely with GYN.     Diagnosis     Clinical Impression:   1. Dyspareunia in female    2. Lower abdominal pain    3. Constipation, unspecified constipation type        Treatment Plan:   ED Disposition     Discharge [name] discharge to  home/self care.    Condition at disposition: Stable          _______________________________    Attestations: This note is prepared by Andre Lefort acting as scribe for Lynnea Ferrier, MD.  Lynnea Ferrier, MD - The scribe's documentation has been prepared under my direction and personally reviewed by me in its entirety. I confirm that the note above accurately reflects all work, treatment, procedures, and medical decision making performed by me.  ______________________________      Maryella Shivers, MD  08/01/15 1100    Maryella Shivers, MD  08/01/15 1101

## 2015-07-28 NOTE — Discharge Instructions (Signed)
Abdominal Pain     You have been diagnosed with abdominal (belly) pain. The cause of your pain is not yet known.     Many things can cause abdominal pain. Examples include viral infections and bowel (intestine) spasms. You might need another examination or more tests to find out why you have pain.     At this time, your pain does not seem to be caused by anything dangerous. You do not need surgery. You do not need to stay in the hospital.      Though we don’t believe your condition is dangerous right now, it is important to be careful. Sometimes a problem that seems mild can become serious later. This is why it is very important that you return here or go to the nearest Emergency Department unless you are 100% improved.     Return here or go to the nearest Emergency Department, or follow up with your physician in:  · 24 hours.     Drink only clear liquids such as water, clear broth, sports drinks, or clear caffeine-free soft drinks, like 7-Up or Sprite, for the next:  · 24 hours.     YOU SHOULD SEEK MEDICAL ATTENTION IMMEDIATELY, EITHER HERE OR AT THE NEAREST EMERGENCY DEPARTMENT, IF ANY OF THE FOLLOWING OCCURS:  · Your pain does not go away or gets worse.  · You cannot keep fluids down or your vomit is dark green.   · You vomit blood or see blood in your stool. Blood might be bright red or dark red. It can also be black and look like tar.  · You have a fever (temperature higher than 100.4ºF / 38ºC) or shaking chills.  · Your skin or eyes look yellow or your urine looks brown.  · You have severe diarrhea.

## 2015-07-29 NOTE — ED Notes (Signed)
Confirmed with lab and Sarah correct swab to use to chlamydia. Was told by lab that yellow top is no longer used. Was given "cervical swab packet"

## 2015-07-30 NOTE — Progress Notes (Signed)
Quick Note:    Negative result, no follow up required.  ______

## 2015-12-09 ENCOUNTER — Emergency Department
Admission: EM | Admit: 2015-12-09 | Discharge: 2015-12-09 | Disposition: A | Discharge status: Home / Self Care | Payer: Medicaid Other | Attending: Emergency Medicine | Admitting: Emergency Medicine

## 2015-12-09 ENCOUNTER — Emergency Department: Payer: Medicaid Other

## 2015-12-09 ENCOUNTER — Emergency Department: Payer: Charity

## 2015-12-09 DIAGNOSIS — O99331 Smoking (tobacco) complicating pregnancy, first trimester: Secondary | ICD-10-CM | POA: Insufficient documentation

## 2015-12-09 DIAGNOSIS — Z3A01 Less than 8 weeks gestation of pregnancy: Secondary | ICD-10-CM | POA: Insufficient documentation

## 2015-12-09 DIAGNOSIS — O200 Threatened abortion: Secondary | ICD-10-CM | POA: Insufficient documentation

## 2015-12-09 LAB — URINALYSIS, REFLEX TO MICROSCOPIC EXAM IF INDICATED
Bilirubin, UA: NEGATIVE
Glucose, UA: NEGATIVE
Ketones UA: NEGATIVE
Leukocyte Esterase, UA: NEGATIVE
Nitrite, UA: NEGATIVE
Protein, UR: NEGATIVE
Specific Gravity UA: 1.01 (ref 1.001–1.035)
Urine pH: 7 (ref 5.0–8.0)
Urobilinogen, UA: NORMAL mg/dL

## 2015-12-09 LAB — CBC AND DIFFERENTIAL
Absolute NRBC: 0 10*3/uL
Basophils Absolute Automated: 0.05 10*3/uL (ref 0.00–0.20)
Basophils Automated: 0.6 %
Eosinophils Absolute Automated: 0.2 10*3/uL (ref 0.00–0.70)
Eosinophils Automated: 2.5 %
Hematocrit: 38.8 % (ref 37.0–47.0)
Hgb: 13 g/dL (ref 12.0–16.0)
Immature Granulocytes Absolute: 0.03 10*3/uL
Immature Granulocytes: 0.4 %
Lymphocytes Absolute Automated: 2.77 10*3/uL (ref 0.50–4.40)
Lymphocytes Automated: 34.3 %
MCH: 29.3 pg (ref 28.0–32.0)
MCHC: 33.5 g/dL (ref 32.0–36.0)
MCV: 87.4 fL (ref 80.0–100.0)
MPV: 9.1 fL — ABNORMAL LOW (ref 9.4–12.3)
Monocytes Absolute Automated: 0.59 10*3/uL (ref 0.00–1.20)
Monocytes: 7.3 %
Neutrophils Absolute: 4.43 10*3/uL (ref 1.80–8.10)
Neutrophils: 54.9 %
Nucleated RBC: 0 /100 WBC (ref 0.0–1.0)
Platelets: 312 10*3/uL (ref 140–400)
RBC: 4.44 10*6/uL (ref 4.20–5.40)
RDW: 13 % (ref 12–15)
WBC: 8.07 10*3/uL (ref 3.50–10.80)

## 2015-12-09 LAB — COMPREHENSIVE METABOLIC PANEL
ALT: 10 U/L (ref 0–55)
AST (SGOT): 11 U/L (ref 5–34)
Albumin/Globulin Ratio: 1.3 (ref 0.9–2.2)
Albumin: 3.8 g/dL (ref 3.5–5.0)
Alkaline Phosphatase: 54 U/L (ref 37–106)
Anion Gap: 11 (ref 5.0–15.0)
BUN: 6 mg/dL — ABNORMAL LOW (ref 7.0–19.0)
Bilirubin, Total: 0.4 mg/dL (ref 0.2–1.2)
CO2: 21 mEq/L — ABNORMAL LOW (ref 22–29)
Calcium: 8.7 mg/dL (ref 8.5–10.5)
Chloride: 107 mEq/L (ref 100–111)
Creatinine: 0.8 mg/dL (ref 0.6–1.0)
Globulin: 2.9 g/dL (ref 2.0–3.6)
Glucose: 101 mg/dL — ABNORMAL HIGH (ref 70–100)
Potassium: 3.7 mEq/L (ref 3.5–5.1)
Protein, Total: 6.7 g/dL (ref 6.0–8.3)
Sodium: 139 mEq/L (ref 136–145)

## 2015-12-09 LAB — HCG QUANTITATIVE: hCG, Quant.: 2701.3

## 2015-12-09 LAB — ABO/RH: ABO Rh: B POS

## 2015-12-09 LAB — GFR: EGFR: >60

## 2015-12-09 MED ORDER — ACETAMINOPHEN 500 MG PO TABS
1000.0000 mg | ORAL_TABLET | Freq: Once | ORAL | Status: AC
Start: 2015-12-09 — End: 2015-12-09
  Administered 2015-12-09: 1000 mg via ORAL
  Filled 2015-12-09: qty 2

## 2015-12-09 NOTE — ED Provider Notes (Signed)
EMERGENCY DEPARTMENT HISTORY AND PHYSICAL EXAM     Physician/Midlevel provider first contact with patient: 12/09/15 1610         Date: 12/09/2015  Patient Name: Judy Aguirre    History of Presenting Illness     Chief Complaint   Patient presents with   . Vaginal Bleeding-pregnant   . Abdominal Pain       History Provided By: Patient    Chief Complaint: Vaginal bleeding during pregnancy   Onset: Yesterday   Timing: Persistent and gradually worsening   Location: GU and suprapubic area   Quality: Bright red   Severity: Moderate  Exacerbating factors: None reported   Alleviating factors: No pain medications taken   Associated Symptoms: Suprapubic cramps (similar to menstrual cramps), or back pain   Pertinent Negatives: Denies fever, nausea, vomiting, diarrhea, or dysuria    Additional History: Judy Aguirre is a 35 y.o. female who says she is [redacted] weeks pregnant presenting to the ED with vaginal bleeding and associated suprapubic cramping since yesterday. She says she first noticed some brown discharge yesterday, which progressed into pinkish then bright red bleeding. Pt notes she has not soaked a whole pad with the bleeding but sees clots when she uses the bathroom. She has not taken any pain medications to alleviate her cramping. Denies fever, nausea, vomiting, diarrhea, or dysuria. Pt states her LNMP was June 21st. Pregnancy history: G3: P1: A1. She says she is supposed to see a clinic in [place] for confirmation of her pregnancy.     PCP: Pcp, Noneorunknown, MD  SPECIALISTS:    No current facility-administered medications for this encounter.     Current Outpatient Prescriptions   Medication Sig Dispense Refill   . Prenatal Multivit-Min-Fe-FA (PRENATAL 1 + IRON PO) Take 1 tablet by mouth daily.         Past History     Past Medical History:  Past Medical History   Diagnosis Date   . Abnormal vision      wears glasses       Past Surgical History:  Past Surgical History   Procedure Laterality Date    . Vaginal delivery  2000   . Induced abortion       x2   . Leep lletz  05/21/2013     Procedure: LEEP LLETZ;  Surgeon: Jeanie Cooks, MD;  Location: [facility];  Service: Gynecology;  Laterality: N/A;       Family History:  History reviewed. No pertinent family history.    Social History:  Social History   Substance Use Topics   . Smoking status: Current Some Day Smoker -- 3 years   . Smokeless tobacco: None   . Alcohol Use: No      Comment: Pt stated she stopped drinking while pregnant        Allergies:  Allergies   Allergen Reactions   . Penicillins Other (See Comments)     Was told by parent she was allergic as child   . Tylenol Pm Extra Strength [Diphenhydramine-Acetaminophen]        Review of Systems   Review of Systems   Constitutional: Negative for fever.   Respiratory: Negative for cough.    Gastrointestinal: Negative for nausea, vomiting and diarrhea.        (+) Suprapubic cramping    Genitourinary: Positive for vaginal bleeding and vaginal discharge. Negative for dysuria.   Musculoskeletal: Positive for back pain.       Physical Exam  BP 113/75 mmHg  Pulse 79  Temp(Src) 98.7 F (37.1 C) (Oral)  Resp 16  Ht 5\' 7"  (1.702 m)  Wt 77.111 kg  BMI 26.62 kg/m2  SpO2 98%  LMP 10/08/2015    Physical Exam   Constitutional: Patient is oriented to person, place, and time and well-developed, well-nourished, and in no distress.   Head: Normocephalic and atraumatic.   Eyes: EOM are normal. Pupils are equal, round, and reactive to light.   Neck: Normal range of motion. Neck supple.   Cardiovascular: Normal rate and regular rhythm.   Pulmonary/Chest: Effort normal and breath sounds normal. No respiratory distress.   Abdominal: Soft. There is mild suprapubic tenderness. Bowel sounds present and normal.  Musculoskeletal: Normal range of motion.   Neurological: Patient is alert and oriented to person, place, and time. GCS score is 15.   Skin: Skin is warm and dry.       Diagnostic Study Results     Labs -      Results     Procedure Component Value Units Date/Time    UA with reflex to micro (pts  3 + yrs) [[id number]]  (Abnormal) Collected:  12/09/15 0650    Specimen Information:  Urine Updated:  12/09/15 0719     Urine Type Clean Catch      Color, UA Straw      Clarity, UA Clear      Specific Gravity UA 1.010      Urine pH 7.0      Leukocyte Esterase, UA Negative      Nitrite, UA Negative      Protein, UR Negative      Glucose, UA Negative      Ketones UA Negative      Urobilinogen, UA Normal mg/dL      Bilirubin, UA Negative      Blood, UA Large (A)      RBC, UA TNTC (A) /hpf      WBC, UA 0-5 /hpf      Squamous Epithelial Cells, Urine 0-5 /hpf     hCG, Quantitative [[id number]] Collected:  12/09/15 0641     hCG, Quant. 2701.3 Updated:  12/09/15 0718    Comprehensive metabolic panel [[id number]]  (Abnormal) Collected:  12/09/15 0641    Specimen Information:  Blood Updated:  12/09/15 0712     Glucose 101 (H) mg/dL      BUN 6.0 (L) mg/dL      Creatinine 0.8 mg/dL      Sodium 962 mEq/L      Potassium 3.7 mEq/L      Chloride 107 mEq/L      CO2 21 (L) mEq/L      Calcium 8.7 mg/dL      Protein, Total 6.7 g/dL      Albumin 3.8 g/dL      AST (SGOT) 11 U/L      ALT 10 U/L      Alkaline Phosphatase 54 U/L      Bilirubin, Total 0.4 mg/dL      Globulin 2.9 g/dL      Albumin/Globulin Ratio 1.3      Anion Gap 11.0     GFR [[id number]] Collected:  12/09/15 0641     EGFR >60.0 Updated:  12/09/15 0712    CBC with differential [[id number]]  (Abnormal) Collected:  12/09/15 0641    Specimen Information:  Blood from Blood Updated:  12/09/15 0652     WBC 8.07 x10 3/uL      Hgb  13.0 g/dL      Hematocrit 24.4 %      Platelets 312 x10 3/uL      RBC 4.44 x10 6/uL      MCV 87.4 fL      MCH 29.3 pg      MCHC 33.5 g/dL      RDW 13 %      MPV 9.1 (L) fL      Neutrophils 54.9 %      Lymphocytes Automated 34.3 %      Monocytes 7.3 %      Eosinophils Automated 2.5 %      Basophils Automated 0.6 %      Immature Granulocyte 0.4 %      Nucleated RBC 0.0 /100  WBC      Neutrophils Absolute 4.43 x10 3/uL      Abs Lymph Automated 2.77 x10 3/uL      Abs Mono Automated 0.59 x10 3/uL      Abs Eos Automated 0.20 x10 3/uL      Absolute Baso Automated 0.05 x10 3/uL      Absolute Immature Granulocyte 0.03 x10 3/uL      Absolute NRBC 0.00 x10 3/uL     Urine culture [[id number]] Collected:  12/09/15 0650    Specimen Information:  Urine from Urine, Clean Catch Updated:  12/09/15 0650          Radiologic Studies -   Radiology Results (24 Hour)     Procedure Component Value Units Date/Time    US OB Transvaginal Only [[id number]] Collected:  12/09/15 0747    Order Status:  Completed Updated:  12/09/15 0755    Narrative:      CLINICAL HISTORY:  VAGINAL BLEEDING, PREGNANT    Endovaginal pelvic ultrasound was performed.     COMPARISON: None     FINDINGS:      The slightly prominent uterus measures 10.6 x 5.4 x 6.5 cm. No  intrauterine gestational sac is identified. The endometrial stripe  appears unremarkable measuring 0.9 cm. Small avascular heterogeneous  material within the endocervical canal.    The ovaries appear unremarkable. The right ovary measures 3.6 x 1.6 x  1.7 cm. The left ovary measures 4.2 x 2.9 x 2.5 cm.  No abnormal adnexal  mass. Left ovary contains a normal-appearing corpus luteal cyst.    There is no free fluid in the cul-de-sac.      Impression:             No intrauterine gestational sac identified. Small avascular  heterogeneous material within the endocervical canal is worrisome for  pregnancy failure. Other differential considerations include too early  in the gestational age to see a gestational sac,  or ectopic pregnancy.  Clinical correlation, followup with serial beta-hCG and/or followup  ultrasound is recommended.    Max Fickle, MD   12/09/2015 7:51 AM        .    Medical Decision Making   I am the first provider for this patient.    I reviewed the vital signs, available nursing notes, past medical history, past surgical history, family history and  social history.    Vital Signs-Reviewed the patient's vital signs.     Patient Vitals for the past 12 hrs:   BP Temp Pulse Resp   12/09/15 0810 113/75 mmHg - 79 16   12/09/15 0619 117/75 mmHg 98.7 F (37.1 C) 77 18       Pulse Oximetry Analysis - Normal 97%  on RA    Old Medical Records: Nursing notes.     ED Course:      6:34 AM - Updated patient on plan for bloodwork and [place]. Counseled on safe pain medications to take during pregnancy. Patient says she wants Tylenol while waiting for [place].     7:59 AM - Discussed results with patient. Counseled on possibility of miscarriage and explained interpretation of the results.     8:07 AM - Discussed results with pt and counseled on diagnosis, f/u plans, medication use, and signs and symptoms when to return to ED.  Pt is stable and ready for discharge.       Provider Notes: 60 F, G3P1, presents today with vaginal bleeding/cramping in first trimester of pregnancy.  By dates, she's about 6-[redacted] weeks pregnant, but HCG showing only 3-[redacted] weeks pregnant with possible IUP.  Rh +. Patient will need repeat HCG and ultrasound in a few days to see if pregnancy is viable or not.  She was counseled x 15 minutes with regards to all of the possibilities.   Patient agrees to f/u with OB and will return for new or worsening symptoms.       Diagnosis     Clinical Impression:   1. Threatened miscarriage in early pregnancy        Treatment Plan:   ED Disposition     Discharge [name] discharge to home/self care.    Condition at disposition: Stable              _______________________________    Attestations: This note is prepared by Janyth Pupa, acting as scribe for Brooke Bonito, DO.    Brooke Bonito, DO - The scribe's documentation has been prepared under my direction and personally reviewed by me in its entirety.  I confirm that the note above accurately reflects all work, treatment, procedures, and medical decision making performed by  me.    _______________________________    Brooke Bonito, DO  12/09/15 1008

## 2015-12-09 NOTE — Discharge Instructions (Signed)
Please be aware that an emergency department diagnosis is often preliminary, and that often a definitive diagnosis cannot be made.  EVERY disease has a progression and may take time before it is recognizable.  It is EXTREMELY important that you return to the Emergency Department or see your own doctor if you do not improve or especially if your symptoms worsen or change. This is particularly true for those conditions which not uncommonly cannot be diagnosed with certainty in the ER and are potentially dangerous, such as chest pain, abdominal pain, headache and pediatric fever.    In short, DO NOT HESITATE TO RETURN TO THE EMERGENCY DEPARTMENT IF YOU SENSE THAT SOMETHING IS NOT RIGHT!    Miscarriage, Threatened    You have been diagnosed with a threatened miscarriage.    Your bleeding may be an early sign of miscarriage. You may have heard the term "threatened abortion." This is the medical term for a miscarriage. It is a fairly common condition in the first trimester (12 weeks) of pregnancy. Half of all pregnant women have bleeding during pregnancy. Half of these women go on to have a normal pregnancy and the other half go on to have a miscarriage. You probably did nothing to cause to this. Nothing the doctor does will prevent a miscarriage once it starts.    It is OK to go home.    For the next few days you should:   Get plenty of rest. Avoid high-energy activities like heavy lifting or standing for a long time. If you must go back to work, ask your doctor about work restrictions. You don t have to stay in bed unless the doctor says to.   Stay well-hydrated at all times. Eat a balanced, healthy, nutritious diet. Also take your prenatal vitamins.   Avoid sexual intercourse and douching. Avoid putting anything into the vagina.   If you smoke, you need to quit. Smoking increases the risk of miscarriage and also causes birth defects.    Most medicines are not recommended during pregnancy. However, take any  medicines your doctor prescribes. If you take any prescription medicines, be sure your doctor knows you are pregnant so any changes can be made. Acetaminophen (Tylenol) is considered safe for pregnant women and their babies.    It is VERY IMPORTANT to follow up with your obstetrician (OB doctor) or gynecologist in the next few days. Tell your OB doctor about this evaluation.    You may need to return here or go to the nearest Emergency Department if more symptoms that might signal miscarriage complications develop.    YOU SHOULD SEEK MEDICAL ATTENTION IMMEDIATELY, EITHER HERE OR AT THE NEAREST EMERGENCY DEPARTMENT, IF ANY OF THE FOLLOWING OCCURS:   More pain in the abdomen (belly), pelvis or back.   More vaginal bleeding, soaking pads/tampons through (more than one pad per hour). Passing large clots or fetal tissue.   You are dizzy, lightheaded, or you pass out.

## 2015-12-09 NOTE — ED Notes (Signed)
Pt arrived ambulatory with c/o vaginal bleeding- [redacted] weeks pregnant.  Pt c/o abdominal cramping.  Pt stated I was using panty liners last night, I put on a regular pad this morning.  Pt stated blood is bright red.

## 2015-12-12 ENCOUNTER — Emergency Department: Payer: Charity

## 2015-12-12 ENCOUNTER — Emergency Department: Payer: Medicaid Other

## 2015-12-12 ENCOUNTER — Emergency Department
Admission: EM | Admit: 2015-12-12 | Discharge: 2015-12-12 | Disposition: A | Discharge status: Home / Self Care | Payer: Medicaid Other | Attending: Emergency Medicine | Admitting: Emergency Medicine

## 2015-12-12 DIAGNOSIS — O039 Complete or unspecified spontaneous abortion without complication: Secondary | ICD-10-CM

## 2015-12-12 DIAGNOSIS — F17200 Nicotine dependence, unspecified, uncomplicated: Secondary | ICD-10-CM | POA: Insufficient documentation

## 2015-12-12 LAB — CBC AND DIFFERENTIAL
Absolute NRBC: 0 10*3/uL
Basophils Absolute Automated: 0.01 10*3/uL (ref 0.00–0.20)
Basophils Automated: 0.2 %
Eosinophils Absolute Automated: 0.09 10*3/uL (ref 0.00–0.70)
Eosinophils Automated: 1.8 %
Hematocrit: 40.6 % (ref 37.0–47.0)
Hgb: 13.6 g/dL (ref 12.0–16.0)
Immature Granulocytes Absolute: 0.02 10*3/uL
Immature Granulocytes: 0.4 %
Lymphocytes Absolute Automated: 1.65 10*3/uL (ref 0.50–4.40)
Lymphocytes Automated: 33.6 %
MCH: 29.1 pg (ref 28.0–32.0)
MCHC: 33.5 g/dL (ref 32.0–36.0)
MCV: 86.9 fL (ref 80.0–100.0)
MPV: 8.8 fL — ABNORMAL LOW (ref 9.4–12.3)
Monocytes Absolute Automated: 0.31 10*3/uL (ref 0.00–1.20)
Monocytes: 6.3 %
Neutrophils Absolute: 2.83 10*3/uL (ref 1.80–8.10)
Neutrophils: 57.7 %
Nucleated RBC: 0 /100 WBC (ref 0.0–1.0)
Platelets: 302 10*3/uL (ref 140–400)
RBC: 4.67 10*6/uL (ref 4.20–5.40)
RDW: 13 % (ref 12–15)
WBC: 4.91 10*3/uL (ref 3.50–10.80)

## 2015-12-12 LAB — HCG QUANTITATIVE: hCG, Quant.: 262.4

## 2015-12-12 NOTE — ED Notes (Addendum)
Pt reports was seen here on 8/22 for threatened miscarriage. Told to return for hormone recheck and states still having moderate vag. Bleeding. No passage of clots. States she passage possible products of conception on 8/22.

## 2015-12-12 NOTE — Special Discharge Instructions (Signed)
Please follow-up with one of the OB/GYNs on the list provided to make sure your hormone level returns to 0.

## 2015-12-12 NOTE — ED Provider Notes (Signed)
EMERGENCY DEPARTMENT HISTORY AND PHYSICAL EXAM     Physician/Midlevel provider first contact with patient: 12/12/15 1146         Date: 12/12/2015  Patient Name: Judy Aguirre    History of Presenting Illness     Chief Complaint   Patient presents with   . Vaginal Bleeding-pregnant       History Provided By: Patient    Chief Complaint: Vaginal bleeding  Onset: 4 days  Timing: Constant  Location: Vaginal   Quality: No clots  Severity: Moderate  Exacerbating factors: None.   Alleviating factors: No medications reported.   Associated Symptoms: Pelvic pain.   Pertinent Negatives: Denies fever.     Additional History: Judy Aguirre is a 35 y.o. female [phone]) presenting to the ED with vaginal bleeding. Pt reports that she is [redacted] weeks pregnant and was seen in ED 12/09/15 for vaginal bleeding and is coming back to ED for repeat hCG. Pt reports that since her last visit her pelvic pain has decreased, her breasts have become less sore, increased appetite, and she has passed a "yellowish, reddish flat thing." The vaginal bleeding has decreased and now she uses 1-2 pads daily. She denies fever.     PCP: Pcp, Noneorunknown, MD  SPECIALISTS:    No current facility-administered medications for this encounter.     Current Outpatient Prescriptions   Medication Sig Dispense Refill   . Prenatal Multivit-Min-Fe-FA (PRENATAL 1 + IRON PO) Take 1 tablet by mouth daily.         Past History     Past Medical History:  Past Medical History   Diagnosis Date   . Abnormal vision      wears glasses       Past Surgical History:  Past Surgical History   Procedure Laterality Date   . Vaginal delivery  2000   . Induced abortion       x2   . Leep lletz  05/21/2013     Procedure: LEEP LLETZ;  Surgeon: Jeanie Cooks, MD;  Location: [facility];  Service: Gynecology;  Laterality: N/A;       Family History:  History reviewed. No pertinent family history.    Social History:  Social History   Substance Use Topics   . Smoking status:  Current Some Day Smoker -- 3 years   . Smokeless tobacco: None   . Alcohol Use: No      Comment: Pt stated she stopped drinking while pregnant        Allergies:  Allergies   Allergen Reactions   . Penicillins Other (See Comments)     Was told by parent she was allergic as child   . Tylenol Pm Extra Strength [Diphenhydramine-Acetaminophen]        Review of Systems     Review of Systems   Constitutional: Negative for fever and activity change.   HENT: Negative for trouble swallowing.    Eyes: Negative for discharge.   Respiratory: Negative for cough and shortness of breath.    Cardiovascular: Negative for chest pain.   Gastrointestinal: Negative for nausea, vomiting and abdominal pain.   Genitourinary: Positive for vaginal bleeding and pelvic pain. Negative for dysuria.   Musculoskeletal: Negative for neck pain.   Skin: Negative for rash.   Neurological: Negative for headaches.   Psychiatric/Behavioral: Negative for confusion.       Physical Exam   BP 122/66 mmHg  Pulse 85  Temp(Src) 99 F (37.2 C) (Oral)  Resp 16  Ht 5\' 7"  (1.702 m)  Wt 74.844 kg  BMI 25.84 kg/m2  SpO2 99%  LMP 10/08/2015    Physical Exam   Constitutional: She is oriented to person, place, and time. She appears well-developed and well-nourished.   HENT:   Head: Normocephalic and atraumatic.   Eyes: Conjunctivae are normal. No scleral icterus.   Cardiovascular: Normal rate, regular rhythm and normal heart sounds.  Exam reveals no gallop and no friction rub.    No murmur heard.  Pulmonary/Chest: Effort normal and breath sounds normal. No respiratory distress. She has no wheezes. She has no rales. She exhibits no tenderness.   Abdominal: Soft. She exhibits no distension and no mass. There is tenderness (Mild left sided pelvic). There is no rebound and no guarding.   Musculoskeletal: Normal range of motion. She exhibits no edema.   Neurological: She is alert and oriented to person, place, and time.   Skin: Skin is warm and dry. No rash noted.    Psychiatric: She has a normal mood and affect. Her behavior is normal. Judgment and thought content normal.   Nursing note and vitals reviewed.      Diagnostic Study Results     Labs -     Results     Procedure Component Value Units Date/Time    Beta HCG Quant Serum [[id number]] Collected:  12/12/15 1221     hCG, Quant. 262.4 Updated:  12/12/15 1250    CBC and differential [[id number]]  (Abnormal) Collected:  12/12/15 1221    Specimen Information:  Blood from Blood Updated:  12/12/15 1228     WBC 4.91 x10 3/uL      Hgb 13.6 g/dL      Hematocrit 78.2 %      Platelets 302 x10 3/uL      RBC 4.67 x10 6/uL      MCV 86.9 fL      MCH 29.1 pg      MCHC 33.5 g/dL      RDW 13 %      MPV 8.8 (L) fL      Neutrophils 57.7 %      Lymphocytes Automated 33.6 %      Monocytes 6.3 %      Eosinophils Automated 1.8 %      Basophils Automated 0.2 %      Immature Granulocyte 0.4 %      Nucleated RBC 0.0 /100 WBC      Neutrophils Absolute 2.83 x10 3/uL      Abs Lymph Automated 1.65 x10 3/uL      Abs Mono Automated 0.31 x10 3/uL      Abs Eos Automated 0.09 x10 3/uL      Absolute Baso Automated 0.01 x10 3/uL      Absolute Immature Granulocyte 0.02 x10 3/uL      Absolute NRBC 0.00 x10 3/uL           Radiologic Studies -   Radiology Results (24 Hour)     Procedure Component Value Units Date/Time    US OB Transvag only  (preg < 14 weeks) [[id number]] Collected:  12/12/15 1403    Order Status:  Completed Updated:  12/12/15 1412    Narrative:      INDICATION: Vaginal bleeding.    TECHNIQUE: Multiplanar transvaginal pelvic ultrasound images were  obtained.    FINDINGS:     LMP: 10/08/2015  EGA: 9 weeks 2 days  EDC: 07/14/2016    No intrauterine pregnancy is identified. No intrauterine  fluid  collection noted. Differential diagnosis includes early pregnancy. The  possibility of an ectopic pregnancy or spontaneous abortion however  cannot be excluded.    The endometrial echoes measure 7.7 mm in thickness.    The cervix measures 2.6 cm in length and  is closed.    The right ovary measures 2.4 x 1.8 x 1.3 cm. The left ovary measures 2.3  x 2.9 x 2.7 cm. There is a complex 1.4 x 1.4 x 1.5 cm left ovarian cyst.    No free pelvic fluid is noted.      Impression:       No intrauterine pregnancy is demonstrated. Differential  diagnosis includes an early pregnancy. The possibility of an ectopic  pregnancy or recent spontaneous abortion however cannot be excluded.  Follow up ultrasonography correlation with serial beta hCG would be  helpful.    Merri Ray, MD   12/12/2015 2:08 PM        .    Medical Decision Making   I am the first provider for this patient.    I reviewed the vital signs, available nursing notes, past medical history, past surgical history, family history and social history.    Vital Signs-Reviewed the patient's vital signs.     Patient Vitals for the past 12 hrs:   BP Temp Pulse Resp   12/12/15 1158 122/66 mmHg 99 F (37.2 C) 85 16       Pulse Oximetry Analysis - Normal 99% on RA    Old Medical Records: Old medical records: Pt is B+. She had labs done 12/09/15 and was told to f/u with OB/GYN for repeat quant and sono. The sono report from 12/09/15 read "No intrauterine gestational sac identified. Small avascular heterogeneous material within the endocervical canal is worrisome for pregnancy failure. Other differential considerations include too early in the gestational age to see a gestational sac, or ectopic pregnancy. Clinical correlation, followup with serial beta-hCG and/or followup ultrasound is recommended." Her hCG at that time was 2701. Nursing notes.     ED Course:     12:08 PM - Discussed plan for blood work and [place] in ED. Pt is agreeable.     2:18 PM - Updated pt on blood work and [place] results. Discussed f/u with OB/GYN, home self care, discharge instructions, and return precautions with patient. Possibility of evolving illness reviewed. All questions solicited and addressed. Patient states understanding and amenable to discharge.      Provider Notes: Seen on 8/22 for pregnancy with VB. Told to follow-up for repeat quant and sono. Has no OB so came here. HCG significantly lower. Sono with no IUP. Will discharge with list of GYN referrals.    Critical Care Time:      Diagnosis     Clinical Impression:   1. Spontaneous abortion        Treatment Plan:   ED Disposition     Discharge [name] discharge to home/self care.    Condition at disposition: Stable              _______________________________      Attestations: This note is prepared by Dillard Essex, acting as scribe for Kathryne Hitch, MD.    Kathryne Hitch, MD - The scribe's documentation has been prepared under my direction and personally reviewed by me in its entirety.  I confirm that the note above accurately reflects all work, treatment, procedures, and medical decision making performed by me.    _______________________________  Kathryne Hitch, MD  12/12/15 [phone]

## 2015-12-17 ENCOUNTER — Ambulatory Visit: Payer: Medicaid Other | Admitting: Obstetrics & Gynecology

## 2015-12-17 ENCOUNTER — Encounter: Payer: Self-pay | Admitting: Obstetrics & Gynecology

## 2015-12-17 VITALS — BP 94/67 | HR 65 | Ht 67.0 in | Wt 158.0 lb

## 2015-12-17 DIAGNOSIS — O039 Complete or unspecified spontaneous abortion without complication: Secondary | ICD-10-CM

## 2015-12-17 NOTE — Progress Notes (Signed)
Subjective:     Judy Aguirre is a 35 y.o. [id number] female here for Follow up exam after recent SAB.  Patient was seen in ED on August 22 ned HCG was approximately 2700 then she had heavy bleeding and passed some ?tissue.  Follow up exam showed no IUP and HCG level of 260.  Bleeding has since stopped.  No pain at present.  Desires pregnancy.  Has a 40 year old son and has had 2 ETABs in the past.   The patient is sexually active.        Gynecologic History  Patient's last menstrual period was 10/08/2015.  Contraception: none         The following portions of the patient's history were reviewed and updated as appropriate: allergies, current medications, past family history, past medical history, past social history, past surgical history and problem list.    Obstetric History  OB History   Gravida Para Term Preterm AB SAB TAB Ectopic Multiple Living   5 1   3 3    0 1      # Outcome Date GA Lbr Len/2nd Weight Sex Delivery Anes PTL Lv   5 SAB            4 Gravida            3 SAB            2 SAB            1 Para                   OB History     Gravida Para Term Preterm AB TAB SAB Ectopic Multiple Living    5 1   3  3   0 1          Past Medical History:     Past Medical History   Diagnosis Date   . Abnormal vision      wears glasses     Past Surgical History:     Past Surgical History   Procedure Laterality Date   . Vaginal delivery  2000   . Induced abortion       x2   . Leep lletz  05/21/2013     Procedure: LEEP LLETZ;  Surgeon: Jeanie Cooks, MD;  Location: [facility];  Service: Gynecology;  Laterality: N/A;     Family History:     Family History   Problem Relation Age of Onset   . No known problems Mother    . No known problems Father      Social History:     Social History   Substance Use Topics   . Smoking status: Current Some Day Smoker -- 3 years   . Smokeless tobacco: None   . Alcohol Use: Yes      Comment: Pt stated she stopped drinking while pregnant      Allergies:     Allergies    Allergen Reactions   . Penicillins Other (See Comments)     Was told by parent she was allergic as child   . Tylenol Pm Extra Strength [Diphenhydramine-Acetaminophen]        Medications:     No outpatient prescriptions have been marked as taking for the 12/17/15 encounter (Office Visit) with Gar Ponto, MD.       Review of Systems  General:Not Present- Fatigue, Weight Gain and Weight Loss.  Respiratory:Not Present- Bloody sputum, Cough, Dyspnea and Wheezing.  Cardiovascular:Not Present- Calf,  thigh or buttock pain with walking, Chest Pain, Difficulty Breathing Lying Down, Difficulty Breathing On Exertion, Edema, Awakening Short of Breath and Palpitations.  Gastrointestinal:Not Present- Bloody Stool, Constipation, Diarrhea, Hematemesis, Indigestion, Nausea and Vomiting.  Neurological:Not Present- Dizziness, Syncope and Weakness.  Musculoskeletal: No arthritic symptoms  Genitourinary: Negative for dysuria    Otherwise 10 point review of systems is negative     Objective:   BP 94/67 mmHg  Pulse 65  Ht 1.702 m (5\' 7" )  Wt 71.668 kg (158 lb)  BMI 24.74 kg/m2  LMP 10/08/2015  Breastfeeding? Unknown  General appearance: alert, appears stated age and cooperative  Neck: no adenopathy, supple, symmetrical, trachea midline and thyroid not enlarged, symmetric, no tenderness/mass/nodules  Abdomen: soft, non-tender normal; no masses,  no organomegaly  Extremities: extremities normal, atraumatic, no cyanosis or edema  Pelvic Exam:   Vulva:  normal   Vagina: normal vagina, no discharge, exudate, lesion, or erythema   Cervix:  no cervical motion tenderness and no lesions   Corpus: normal size, contour, position, consistency, mobility, non-tender   Adnexa:  no mass, fullness, tenderness     Assessment:     Healthy female exam.  Problem List Items Addressed This Visit     None        Patient Active Problem List   Diagnosis   . High grade squamous intraepithelial cervical dysplasia     Plan:   Recent SAB.   Discussed Checking HCG till less than 5.    Discussed SAB in general and risks.  Will Check TSH and Hemoglobin A1c  Follow up as needed.

## 2015-12-17 NOTE — Addendum Note (Signed)
Addended by: Melton Alar on: 12/17/2015 03:38 PM     Modules accepted: Orders

## 2015-12-18 LAB — HEMOGLOBIN A1C: Hemoglobin A1C: 5.5 % (ref 4.8–5.6)

## 2015-12-18 LAB — HCG QUANTITATIVE: human chorionic gonadotropin (hCG), Beta Chain, Quant., S: 84 m[IU]/mL

## 2015-12-18 LAB — TSH: TSH: 1.25 u[IU]/mL (ref 0.450–4.500)

## 2016-01-05 ENCOUNTER — Ambulatory Visit: Payer: Medicaid Other | Admitting: Obstetrics & Gynecology

## 2016-05-05 ENCOUNTER — Other Ambulatory Visit: Payer: Self-pay | Admitting: Family Medicine

## 2016-05-05 DIAGNOSIS — R110 Nausea: Secondary | ICD-10-CM

## 2016-05-06 ENCOUNTER — Ambulatory Visit
Admission: RE | Admit: 2016-05-06 | Discharge: 2016-05-06 | Disposition: A | Discharge status: Home / Self Care | Payer: Medicaid Other | Source: Ambulatory Visit | Attending: Family Medicine | Admitting: Family Medicine

## 2016-05-06 ENCOUNTER — Other Ambulatory Visit: Payer: Self-pay | Admitting: Physician Assistant

## 2016-05-06 ENCOUNTER — Ambulatory Visit
Admission: RE | Admit: 2016-05-06 | Discharge: 2016-05-06 | Disposition: A | Discharge status: Home / Self Care | Payer: Medicaid Other | Source: Ambulatory Visit | Attending: Physician Assistant | Admitting: Physician Assistant

## 2016-05-06 DIAGNOSIS — M25532 Pain in left wrist: Secondary | ICD-10-CM | POA: Insufficient documentation

## 2016-05-06 DIAGNOSIS — R110 Nausea: Secondary | ICD-10-CM | POA: Insufficient documentation

## 2016-05-10 NOTE — ED Notes (Signed)
Received call from primary care office Sarah, RN asking for results of wrist xray and abdominal ultrasound, for Dr. Bennett Scrape. Called patient and received consent to send results. Faxed results to [phone]

## 2016-07-09 ENCOUNTER — Ambulatory Visit: Payer: Medicaid Other | Admitting: Obstetrics & Gynecology

## 2016-07-09 ENCOUNTER — Encounter: Payer: Self-pay | Admitting: Obstetrics & Gynecology

## 2016-07-09 VITALS — BP 98/66 | HR 80 | Wt 161.0 lb

## 2016-07-09 DIAGNOSIS — O200 Threatened abortion: Secondary | ICD-10-CM

## 2016-07-09 DIAGNOSIS — R102 Pelvic and perineal pain: Secondary | ICD-10-CM

## 2016-07-09 NOTE — Progress Notes (Signed)
Chief Complaints:    pelvic pain for  3 days.    History of Presenting Illness:         Patient 36 y.o. [id number] at [redacted]w[redacted]d by unknown LMP.  Patient reports pelvic pain.  She is not in acute distress.  Ectopic risks include none. Cycle length is irregular. Pregnancy testing: at home. Pregnancy imaging: not done.  Patient had early pregnancy loss earlier this year.  Not sure of LMP but has 2 pregnancy tests at home.      Blood type: B positive.  Other lab results: none.     Gynecologic History  Patient's last menstrual period was 05/17/2015.  Menses flow is moderate  Contraception: none  STDs: no past history    Obstetric History  OB History   Gravida Para Term Preterm AB Living   6 1     3 1    SAB TAB Ectopic Multiple Live Births   3     0        # Outcome Date GA Lbr Len/2nd Weight Sex Delivery Anes PTL Lv   6 Current            5 SAB            4 Gravida            3 SAB            2 SAB            1 Para                 OB History     Gravida Para Term Preterm AB Living    6 1     3 1     SAB TAB Ectopic Multiple Live Births    3     0            Past Medical History:     Past Medical History:   Diagnosis Date   . Abnormal vision     wears glasses     Past Medical History:   Diagnosis Date   . Abnormal vision     wears glasses     Past Surgical History:     Past Surgical History:   Procedure Laterality Date   . INDUCED ABORTION      x2   . LEEP LLETZ  05/21/2013    Procedure: LEEP LLETZ;  Surgeon: Jeanie Cooks, MD;  Location: [facility];  Service: Gynecology;  Laterality: N/A;   . VAGINAL DELIVERY  2000       Family History:     Family History   Problem Relation Age of Onset   . No known problems Mother    . No known problems Father        Social History:     Social History   Substance Use Topics   . Smoking status: Current Some Day Smoker     Years: 3.00   . Smokeless tobacco: Former Neurosurgeon   . Alcohol use Yes      Comment: Pt stated she stopped drinking while pregnant        Allergies:     Allergies   Allergen  Reactions   . Penicillins Other (See Comments)     Was told by parent she was allergic as child   . Tylenol Pm Extra Strength [Diphenhydramine-Acetaminophen]        Medications:     No outpatient prescriptions have been marked as taking for  the 07/09/16 encounter (Routine Prenatal) with Gar Ponto, MD.     @IPPTAMEDS @      Review of Systems  General:Not Present- Fatigue, Weight Gain and Weight Loss.  Respiratory:Not Present- Bloody sputum, Cough, Dyspnea and Wheezing.  Cardiovascular:Not Present- Calf, thigh or buttock pain with walking, Chest Pain, Difficulty Breathing Lying Down, Difficulty Breathing On Exertion, Edema, Awakening Short of Breath and Palpitations.  Gastrointestinal:Not Present- Bloody Stool, Constipation, Diarrhea, Hematemesis, Indigestion, Nausea and Vomiting.  Neurological:Not Present- Dizziness, Syncope and Weakness.  Musculoskeletal: No arthritic symptoms  Genitourinary: Negative for dysuria    Otherwise 10 point review of systems is negative.     Objective:        BP 98/66   Pulse 80   Wt 73 kg (161 lb)   LMP 05/17/2015   Breastfeeding? No   BMI 25.22 kg/m    General appearance: alert, appears stated age and cooperative  Neck: no adenopathy, supple, symmetrical, trachea midline and thyroid not enlarged, symmetric, no tenderness/mass/nodules  Lungs: clear to auscultation bilaterally  Breasts: normal appearance, no masses or tenderness, No nipple retraction or dimpling  Heart: regular rate and rhythm  Abdomen: soft, non-tender; bowel sounds normal; no masses,  no organomegaly  Extremities: extremities normal, atraumatic, no cyanosis or edema  Pelvic Exam:   Vulva:  normal   Vagina: normal vagina, no discharge, exudate, lesion, or erythema   Cervix:   closed, vaginal bleeding: no   Corpus: normal size, contour, position, consistency, mobility, non-tender   Adnexa:  no mass, fullness, tenderness     ULTRASOUND REPORT    Date Time: 07/09/2016 12:11 PM    INDICATION FOR STUDY:    Problem List Items Addressed This Visit     None      Visit Diagnoses     Threatened abortion in early pregnancy    -  Primary          STUDIES PERFORMED: Tranabdominal and transvaginal ultrasound, Color and power Doppler studies    ULTRASOUND FINDINGS:    UTERUS:   The uterus is in anteverted in position, normal in size and contour. Myometrium is normal in appearance. There is no uterine fibroid, no adenomyosis noted.  8.5 x 5.7 x 6.6 cm    CERVIX:   Normal; No Nabothian cyst noted    ENDOMETRIUM:  Singleton gestation with yolk sac and fetal pole    RIGHT OVARY:  Normal in size and appearance.  Follicles are noted.  1.9 x 1.8 x 2 cm  RIGHT ADNEXA: Normal; no masses seen;    LEFT OVARY:  Normal in size and appearance.  Follicles are noted.  2.5 x 2 x 2.5 cm  LEFT ADNEXA:  Normal; no masses seen;     Anterior and Posterior Cul de Sac: Negative free fluid collection    BLADDER:   Normal    DOPPLER STUDIES:        Uterus and endometrium: Normal arterial flow and Resistivity    Index (RI); normal venous flow.       Bilateral adnexa and ovaries: Normal arterial flow and Resistivity    Index (RI); normal venous flow.    IMPRESSION:       Normal uterus     Normal adnexa and ovaries     Normal blood flow to the uterus and ovaries on Doppler studies    Signed by: Gar Ponto, MD       Early OB Ultrasound Procedure Note    Pelvic ultrasound (Transabdominal, Transvaginal  and Doppler)    Indications:  Early gestation, cramping   Problem List Items Addressed This Visit     None      Visit Diagnoses     Threatened abortion in early pregnancy    -  Primary          Procedure Details     Transabdominal and transvaginal ultrasound were performed.     The uterus was noted to be anteverted in position. Normal bilateral ovaries and adnexa. The cul-de-sac is free and contains no fluid.  Measurements accompanying this report are noted. A single intrauterine pregnancy was noted.  A gestational sac is seen containing a yolk sac and  a singleton embryo. No subchorionic lucency is noted.   The embryonic crown-rump length calculates to an estimated gestational age of  [redacted]w[redacted]d. Embryonic cardiac activity is seen at a rate of Visible Fetal heart beat.    Findings:  Viable, singleton intrauterine pregnancy at  [redacted]w[redacted]d, with a calculated Estimated Date of Delivery: 03/02/17.    Assessment:     Threatened Abortion at [redacted]w[redacted]d    Problem List Items Addressed This Visit     None      Visit Diagnoses     Threatened abortion in early pregnancy    -  Primary        Patient Active Problem List   Diagnosis   . High grade squamous intraepithelial cervical dysplasia        Plan:   SAB precautions given.  Pelvic rest recommended  Initial labs drawn.  Prenatal vitamins.  Problem list reviewed and updated.  AFP3 discussed: requested.  Role of ultrasound in pregnancy discussed; fetal survey: requested.  Amniocentesis discussed: requested.  Follow up in 4 weeks.

## 2016-07-16 ENCOUNTER — Other Ambulatory Visit: Payer: Self-pay | Admitting: Obstetrics & Gynecology

## 2016-07-16 MED ORDER — TERCONAZOLE 80 MG VA SUPP
80.0000 mg | Freq: Every evening | VAGINAL | 0 refills | Status: DC
Start: 2016-07-16 — End: 2016-07-23

## 2016-07-23 ENCOUNTER — Other Ambulatory Visit: Payer: Self-pay | Admitting: Obstetrics & Gynecology

## 2016-07-23 MED ORDER — TERCONAZOLE 80 MG VA SUPP
80.0000 mg | Freq: Every evening | VAGINAL | 0 refills | Status: DC
Start: 2016-07-23 — End: 2017-11-07

## 2016-07-30 ENCOUNTER — Ambulatory Visit: Payer: Medicaid Other | Admitting: Obstetrics & Gynecology

## 2016-07-30 VITALS — BP 93/69 | HR 80 | Wt 158.0 lb

## 2016-07-30 DIAGNOSIS — R768 Other specified abnormal immunological findings in serum: Secondary | ICD-10-CM

## 2016-07-30 DIAGNOSIS — Z3A09 9 weeks gestation of pregnancy: Secondary | ICD-10-CM

## 2016-07-30 DIAGNOSIS — Z8751 Personal history of pre-term labor: Secondary | ICD-10-CM

## 2016-07-30 NOTE — Progress Notes (Signed)
35 y.o. [id number] [redacted]w[redacted]d.    No complaint. Positive fetal movement. No vaginal bleeding. No leaking fluid. No abdominal pain.    BP 93/69   Pulse 80   Wt 71.7 kg (158 lb)   LMP 05/17/2015   BMI 24.75 kg/m       Size = Dates  No complications.     Problem List Items Addressed This Visit        Other    History of preterm delivery    HSV-2 seropositive      Hx Preterm delivery at 36 weeks discussed Makena starting at 16 weeks weekly.  Lab results discussed, Needs Valtrex will start at 32 weeks due to hx of preterm delivery  Discussed Amniocentesis, NIPT and 1st trimester screening   Referral given for screening    F/U in 4 week(s).

## 2016-08-03 ENCOUNTER — Other Ambulatory Visit: Payer: Self-pay | Admitting: Obstetrics & Gynecology

## 2016-08-03 DIAGNOSIS — Z3682 Encounter for antenatal screening for nuchal translucency: Secondary | ICD-10-CM

## 2016-08-06 ENCOUNTER — Encounter: Payer: Medicaid Other | Admitting: Obstetrics & Gynecology

## 2016-08-25 ENCOUNTER — Telehealth (HOSPITAL_BASED_OUTPATIENT_CLINIC_OR_DEPARTMENT_OTHER): Payer: Self-pay | Admitting: Family

## 2016-08-25 ENCOUNTER — Other Ambulatory Visit: Payer: Self-pay | Admitting: Obstetrics & Gynecology

## 2016-08-25 ENCOUNTER — Ambulatory Visit (HOSPITAL_BASED_OUTPATIENT_CLINIC_OR_DEPARTMENT_OTHER)
Admission: RE | Admit: 2016-08-25 | Discharge: 2016-08-25 | Disposition: A | Discharge status: Home / Self Care | Payer: Medicaid Other | Source: Ambulatory Visit | Attending: Obstetrics & Gynecology | Admitting: Obstetrics & Gynecology

## 2016-08-25 ENCOUNTER — Ambulatory Visit
Admission: RE | Admit: 2016-08-25 | Discharge: 2016-08-25 | Disposition: A | Discharge status: Home / Self Care | Payer: Medicaid Other | Source: Ambulatory Visit | Attending: Obstetrics & Gynecology | Admitting: Obstetrics & Gynecology

## 2016-08-25 DIAGNOSIS — O09521 Supervision of elderly multigravida, first trimester: Secondary | ICD-10-CM

## 2016-08-25 DIAGNOSIS — O09529 Supervision of elderly multigravida, unspecified trimester: Secondary | ICD-10-CM

## 2016-08-25 DIAGNOSIS — O09891 Supervision of other high risk pregnancies, first trimester: Secondary | ICD-10-CM

## 2016-08-25 DIAGNOSIS — O358XX0 Maternal care for other (suspected) fetal abnormality and damage, not applicable or unspecified: Secondary | ICD-10-CM

## 2016-08-25 DIAGNOSIS — D181 Lymphangioma, any site: Secondary | ICD-10-CM

## 2016-08-25 DIAGNOSIS — Z3682 Encounter for antenatal screening for nuchal translucency: Secondary | ICD-10-CM | POA: Insufficient documentation

## 2016-08-25 DIAGNOSIS — Z3A13 13 weeks gestation of pregnancy: Secondary | ICD-10-CM | POA: Insufficient documentation

## 2016-08-25 NOTE — Telephone Encounter (Signed)
Set up fetal echo for Ms. Judy Aguirre at the request of Dr. Marinus Maw.  Patient will see Dr. Caryl Never on 09/08/2016 at 830.  Patient has confirmed appointment and verbalized understanding of the appointment time and location.  I provided my contact information if she is to have any further questions.      Davene Jobin T. Driscilla Grammes, FNP-BC  [facility]

## 2016-08-27 ENCOUNTER — Encounter: Payer: Medicaid Other | Admitting: Obstetrics & Gynecology

## 2016-08-27 ENCOUNTER — Ambulatory Visit: Payer: Medicaid Other | Admitting: Obstetrics & Gynecology

## 2016-08-27 VITALS — BP 98/72 | HR 89 | Wt 159.0 lb

## 2016-08-27 DIAGNOSIS — O09523 Supervision of elderly multigravida, third trimester: Secondary | ICD-10-CM | POA: Insufficient documentation

## 2016-08-27 DIAGNOSIS — O09522 Supervision of elderly multigravida, second trimester: Secondary | ICD-10-CM

## 2016-08-27 DIAGNOSIS — D181 Lymphangioma, any site: Secondary | ICD-10-CM

## 2016-08-27 DIAGNOSIS — Z3A13 13 weeks gestation of pregnancy: Secondary | ICD-10-CM

## 2016-08-27 NOTE — Progress Notes (Signed)
35 y.o. [id number] [redacted]w[redacted]d.    No complaint. Positive fetal movement. No vaginal bleeding. No leaking fluid. No abdominal pain.    BP 98/72   Pulse 89   Wt 72.1 kg (159 lb)   LMP 05/17/2015   BMI 24.90 kg/m       Size = Dates  No complications.     Problem List Items Addressed This Visit        Other    Elderly multigravida in second trimester      Cystic Hygroma and Cardiac anomaly noted  Findings discussed with patient.  Affirms that she does not want amniocentesis or [facility]  Has follow up appointments for level II  Hx Preterm delivery - Makena to start at 16 weeks   F/U in 4 week(s).

## 2016-09-06 ENCOUNTER — Ambulatory Visit: Payer: Medicaid Other | Admitting: Obstetrics & Gynecology

## 2016-09-06 VITALS — BP 95/60 | HR 88 | Wt 160.0 lb

## 2016-09-06 DIAGNOSIS — R768 Other specified abnormal immunological findings in serum: Secondary | ICD-10-CM

## 2016-09-06 DIAGNOSIS — O021 Missed abortion: Secondary | ICD-10-CM

## 2016-09-06 DIAGNOSIS — Z3A14 14 weeks gestation of pregnancy: Secondary | ICD-10-CM

## 2016-09-06 DIAGNOSIS — D181 Lymphangioma, any site: Secondary | ICD-10-CM

## 2016-09-06 DIAGNOSIS — Z8751 Personal history of pre-term labor: Secondary | ICD-10-CM

## 2016-09-06 DIAGNOSIS — R87613 High grade squamous intraepithelial lesion on cytologic smear of cervix (HGSIL): Secondary | ICD-10-CM

## 2016-09-06 DIAGNOSIS — O09522 Supervision of elderly multigravida, second trimester: Secondary | ICD-10-CM

## 2016-09-06 NOTE — Progress Notes (Signed)
35 y.o. [id number] [redacted]w[redacted]d.  C/o  Thin white discharge    BP 95/60   Pulse 88   Wt 72.6 kg (160 lb)   LMP 05/17/2015   BMI 25.06 kg/m     Size = Dates       Early OB Ultrasound Procedure Note    Pelvic ultrasound (Transabdominal, Transvaginal and Doppler)    Indications:  Early gestation, discharge    Problem List Items Addressed This Visit     Cystic hygroma    Elderly multigravida in second trimester    High grade squamous intraepithelial cervical dysplasia - Primary    History of preterm delivery    HSV-2 seropositive          Procedure Details     Transabdominal and transvaginal ultrasound were performed.     The uterus was noted to be anteverted in position. Normal bilateral ovaries and adnexa. The cul-de-sac is free and contains no fluid.  Measurements accompanying this report are noted. A single intrauterine pregnancy was noted.  A gestational sac is seen containing a yolk sac and a singleton embryo. No subchorionic lucency is noted.   The embryonic crown-rump length calculates to an estimated gestational age of  [redacted]w[redacted]d. NO CARDIAC ACTIVITY SEEN.     Findings:  Viable, singleton intrauterine pregnancy at  [redacted]w[redacted]d, with a calculated Estimated Date of Delivery: 03/02/17.  NO CARDIAC ACTIVITY SEEN - MISSED AB.    Problem List Items Addressed This Visit     Cystic hygroma    Elderly multigravida in second trimester    High grade squamous intraepithelial cervical dysplasia - Primary    History of preterm delivery    HSV-2 seropositive        Discussed D&E missed ab surgical management - pt will call to schedule date/time as she is unsure when she can have this done this week.

## 2016-09-08 ENCOUNTER — Emergency Department
Admission: EM | Admit: 2016-09-08 | Discharge: 2016-09-08 | Disposition: A | Discharge status: Home / Self Care | Payer: Medicaid Other | Attending: Emergency Medicine | Admitting: Emergency Medicine

## 2016-09-08 ENCOUNTER — Encounter: Payer: Medicaid Other | Admitting: Obstetrics & Gynecology

## 2016-09-08 ENCOUNTER — Emergency Department: Payer: Medicaid Other

## 2016-09-08 ENCOUNTER — Inpatient Hospital Stay (HOSPITAL_BASED_OUTPATIENT_CLINIC_OR_DEPARTMENT_OTHER): Admission: AD | Admit: 2016-09-08 | Payer: Self-pay | Source: Ambulatory Visit

## 2016-09-08 DIAGNOSIS — O039 Complete or unspecified spontaneous abortion without complication: Secondary | ICD-10-CM

## 2016-09-08 DIAGNOSIS — N912 Amenorrhea, unspecified: Secondary | ICD-10-CM

## 2016-09-08 DIAGNOSIS — D649 Anemia, unspecified: Secondary | ICD-10-CM | POA: Insufficient documentation

## 2016-09-08 DIAGNOSIS — F17200 Nicotine dependence, unspecified, uncomplicated: Secondary | ICD-10-CM | POA: Insufficient documentation

## 2016-09-08 DIAGNOSIS — E876 Hypokalemia: Secondary | ICD-10-CM | POA: Insufficient documentation

## 2016-09-08 LAB — COMPREHENSIVE METABOLIC PANEL
ALT: 12 U/L (ref 0–55)
AST (SGOT): 10 U/L (ref 5–34)
Albumin/Globulin Ratio: 1 (ref 0.9–2.2)
Albumin: 3.2 g/dL — ABNORMAL LOW (ref 3.5–5.0)
Alkaline Phosphatase: 47 U/L (ref 37–106)
Anion Gap: 8 (ref 5.0–15.0)
BUN: 6 mg/dL — ABNORMAL LOW (ref 7.0–19.0)
Bilirubin, Total: 0.4 mg/dL (ref 0.2–1.2)
CO2: 22 mEq/L (ref 22–29)
Calcium: 8.8 mg/dL (ref 8.5–10.5)
Chloride: 108 mEq/L (ref 100–111)
Creatinine: 0.6 mg/dL (ref 0.6–1.0)
Globulin: 3.1 g/dL (ref 2.0–3.6)
Glucose: 87 mg/dL (ref 70–100)
Potassium: 3.4 mEq/L — ABNORMAL LOW (ref 3.5–5.1)
Protein, Total: 6.3 g/dL (ref 6.0–8.3)
Sodium: 138 mEq/L (ref 136–145)

## 2016-09-08 LAB — CBC AND DIFFERENTIAL
Absolute NRBC: 0 10*3/uL
Basophils Absolute Automated: 0.03 10*3/uL (ref 0.00–0.20)
Basophils Automated: 0.3 %
Eosinophils Absolute Automated: 0.06 10*3/uL (ref 0.00–0.70)
Eosinophils Automated: 0.7 %
Hematocrit: 34 % — ABNORMAL LOW (ref 37.0–47.0)
Hgb: 11.8 g/dL — ABNORMAL LOW (ref 12.0–16.0)
Immature Granulocytes Absolute: 0.02 10*3/uL
Immature Granulocytes: 0.2 %
Lymphocytes Absolute Automated: 1.62 10*3/uL (ref 0.50–4.40)
Lymphocytes Automated: 18.2 %
MCH: 30.3 pg (ref 28.0–32.0)
MCHC: 34.7 g/dL (ref 32.0–36.0)
MCV: 87.2 fL (ref 80.0–100.0)
MPV: 8.6 fL — ABNORMAL LOW (ref 9.4–12.3)
Monocytes Absolute Automated: 0.51 10*3/uL (ref 0.00–1.20)
Monocytes: 5.7 %
Neutrophils Absolute: 6.65 10*3/uL (ref 1.80–8.10)
Neutrophils: 74.9 %
Nucleated RBC: 0 /100 WBC (ref 0.0–1.0)
Platelets: 296 10*3/uL (ref 140–400)
RBC: 3.9 10*6/uL — ABNORMAL LOW (ref 4.20–5.40)
RDW: 13 % (ref 12–15)
WBC: 8.89 10*3/uL (ref 3.50–10.80)

## 2016-09-08 LAB — URINALYSIS, REFLEX TO MICROSCOPIC EXAM IF INDICATED
Bilirubin, UA: NEGATIVE
Blood, UA: NEGATIVE
Glucose, UA: NEGATIVE
Ketones UA: NEGATIVE
Nitrite, UA: NEGATIVE
Protein, UR: 30 — AB
Specific Gravity UA: 1.03 (ref 1.001–1.035)
Urine pH: 6 (ref 5.0–8.0)
Urobilinogen, UA: 2 mg/dL

## 2016-09-08 LAB — GFR: EGFR: >60

## 2016-09-08 NOTE — ED Provider Notes (Signed)
EMERGENCY DEPARTMENT HISTORY AND PHYSICAL EXAM     Physician/Midlevel provider first contact with patient: 09/08/16 1429         Date: 09/08/2016  Patient Name: Judy Aguirre    History of Presenting Illness     History Provided By: pt     Chief Complaint   Patient presents with   . Abdominal Cramping     Onset:   Timing:   Location:   Quality:   Severity:   Modifying Factors:   Associated Symptoms:     Additional History: Judy Aguirre is a 36 y.o. G73P1A3 female at [redacted]w[redacted]d gestation presents with reports of intermittent lower abdominal cramping x 5 days. Pain is felt b/l, occasionally radiates to L lower back. Nothing appears to make pain better or worse. Admits increased vaginal discharge, though pt is unsure if this is abnormal. Denies possibility of sexually transmitted disease. Denies vaginal pruritis or bleeding, urinary sxs, diarrhea, constipation, N/V, fever. Current pregnancy significant for fetal "neck swelling" seen on NT U/S, subsequent cell free DNA blood work testing was reportedly neg for trisomies. Pt was told neck swelling may be the result of a heart condition. She declined an amnio. OB is Dr. Effie Shy. EDC 03/02/17. Blood type B positive.     PCP: Mickie Hillier, PA      No current facility-administered medications for this encounter.      Current Outpatient Prescriptions   Medication Sig Dispense Refill   . Prenatal Multivit-Min-Fe-FA (PRENATAL 1 + IRON PO) Take 1 tablet by mouth daily.     Marland Kitchen terconazole (TERAZOL 3) 80 MG vaginal suppository Place 1 suppository (80 mg total) vaginally nightly. 1 suppository 0       Past History     Past Medical History:  Past Medical History:   Diagnosis Date   . Abnormal vision     wears glasses       Past Surgical History:  Past Surgical History:   Procedure Laterality Date   . INDUCED ABORTION      x2   . LEEP LLETZ  05/21/2013    Procedure: LEEP LLETZ;  Surgeon: Jeanie Cooks, MD;  Location: [facility];  Service: Gynecology;  Laterality:  N/A;   . VAGINAL DELIVERY  2000       Family History:  Family History   Problem Relation Age of Onset   . No known problems Mother    . No known problems Father        Social History:  Social History   Substance Use Topics   . Smoking status: Current Some Day Smoker     Years: 3.00   . Smokeless tobacco: Former Neurosurgeon   . Alcohol use No      Comment: Pt stated she stopped drinking while pregnant        Allergies:  Allergies   Allergen Reactions   . Penicillins Other (See Comments)     Was told by parent she was allergic as child       Review of Systems     Review of Systems   Constitutional: Negative for chills and fever.   Eyes: Negative for redness.   Respiratory: Negative for shortness of breath and stridor.    Cardiovascular: Negative for chest pain.   Gastrointestinal: Positive for abdominal pain. Negative for blood in stool, constipation, diarrhea, nausea and vomiting.   Genitourinary: Negative for dysuria, flank pain, frequency, hematuria and urgency.        +pregnant, cramping  Musculoskeletal: Negative for falls.   Skin: Negative for rash.   Neurological: Negative for loss of consciousness.   Endo/Heme/Allergies: Does not bruise/bleed easily.   Psychiatric/Behavioral: Negative for memory loss.       Physical Exam     Vitals:    09/08/16 1429 09/08/16 1543   BP: 105/70 120/70   Pulse: 98 80   Resp: 18 16   Temp: 99.3 F (37.4 C)    TempSrc: Oral    SpO2: 98% 98%   Weight: 72.6 kg    Height: 5\' 7"  (1.702 m)        Physical Exam   Constitutional: She is oriented to person, place, and time. She appears well-developed and well-nourished.  Non-toxic appearance. No distress.   HENT:   Head: Normocephalic and atraumatic.   Right Ear: Hearing normal.   Left Ear: Hearing normal.   Eyes: Conjunctivae, EOM and lids are normal.   Neck: Normal range of motion and phonation normal. Neck supple.   Cardiovascular: Normal rate and regular rhythm.    Pulmonary/Chest: Effort normal and breath sounds normal.   Abdominal: Soft.  Normal appearance. There is no tenderness. There is no rebound, no guarding, no CVA tenderness, no tenderness at McBurney's point and negative Murphy's sign.   Neurological: She is alert and oriented to person, place, and time. Gait normal.   Skin: Skin is warm and dry.   Psychiatric: She has a normal mood and affect. Her behavior is normal.   Nursing note and vitals reviewed.        Diagnostic Study Results     Labs -     Results     Procedure Component Value Units Date/Time    Comprehensive metabolic panel [[id number]]  (Abnormal) Collected:  09/08/16 1505    Specimen:  Blood Updated:  09/08/16 1531     Glucose 87 mg/dL      BUN 6.0 (L) mg/dL      Creatinine 0.6 mg/dL      Sodium 409 mEq/L      Potassium 3.4 (L) mEq/L      Chloride 108 mEq/L      CO2 22 mEq/L      Calcium 8.8 mg/dL      Protein, Total 6.3 g/dL      Albumin 3.2 (L) g/dL      AST (SGOT) 10 U/L      ALT 12 U/L      Alkaline Phosphatase 47 U/L      Bilirubin, Total 0.4 mg/dL      Globulin 3.1 g/dL      Albumin/Globulin Ratio 1.0     Anion Gap 8.0    GFR [[id number]] Collected:  09/08/16 1505     Updated:  09/08/16 1531     EGFR >60.0    UA with reflex to micro (pts  3 + yrs) [[id number]]  (Abnormal) Collected:  09/08/16 1505    Specimen:  Urine Updated:  09/08/16 1522     Urine Type Clean Catch     Color, UA Yellow     Clarity, UA Slightly Cloudy     Specific Gravity UA 1.030     Urine pH 6.0     Leukocyte Esterase, UA Large (A)     Nitrite, UA Negative     Protein, UR 30 (A)     Glucose, UA Negative     Ketones UA Negative     Urobilinogen, UA 2.0 mg/dL      Bilirubin, UA Negative  Blood, UA Negative     RBC, UA 0-2 /hpf      WBC, UA 0-5 /hpf      Squamous Epithelial Cells, Urine 6-10 /hpf      Hyaline Casts, UA 0-2 /lpf      Urine Mucus Present    CBC with differential [[id number]]  (Abnormal) Collected:  09/08/16 1505    Specimen:  Blood from Blood Updated:  09/08/16 1512     WBC 8.89 x10 3/uL      Hgb 11.8 (L) g/dL      Hematocrit 40.9 (L) %       Platelets 296 x10 3/uL      RBC 3.90 (L) x10 6/uL      MCV 87.2 fL      MCH 30.3 pg      MCHC 34.7 g/dL      RDW 13 %      MPV 8.6 (L) fL      Neutrophils 74.9 %      Lymphocytes Automated 18.2 %      Monocytes 5.7 %      Eosinophils Automated 0.7 %      Basophils Automated 0.3 %      Immature Granulocyte 0.2 %      Nucleated RBC 0.0 /100 WBC      Neutrophils Absolute 6.65 x10 3/uL      Abs Lymph Automated 1.62 x10 3/uL      Abs Mono Automated 0.51 x10 3/uL      Abs Eos Automated 0.06 x10 3/uL      Absolute Baso Automated 0.03 x10 3/uL      Absolute Immature Granulocyte 0.02 x10 3/uL      Absolute NRBC 0.00 x10 3/uL     Urine culture [[id number]] Collected:  09/08/16 1505    Specimen:  Urine from Urine, Clean Catch Updated:  09/08/16 1505    Narrative:       Replace urinary catheter prior to obtaining the urine culture  if it has been in place for greater than or equal to 14  days:->N/A No Foley  Indications for Urine Culture:->Other (please specify in  Comments)  Indications for Urine Culture:->Neutropenia, Pregnancy, or  Undergoing Urologic Procedure          Radiologic Studies -   Radiology Results (24 Hour)     Procedure Component Value Units Date/Time    US OB Limited > 14 weeks [[id number]] Collected:  09/08/16 1542    Order Status:  Completed Updated:  09/08/16 1550    Narrative:       Indication: [redacted] weeks pregnant. Intermittent cramping for 4 days.    TECHNIQUE: Transabdominal imaging.    CLINICAL DATA: Age by ultrasound 14.4 weeks. [facility] 03/02/2017.    FINDINGS: There is an intrauterine gestation. No cardiac activity is  demonstrated with Doppler. Breech presentation. Anterior placenta.  Low-lying placenta located 1.6 cm from the internal os. No subchorionic  bleed.  Cervix measures 3.8 cm and is closed.  BPD is 2.4 cm corresponding to 14.1 weeks. Head circumference 9.5 cm  corresponding to 14.3 weeks.  Abdominal circumference 8.7 cm corresponding to 15 weeks.  Femur length 1.5 cm corresponding to 14.3  weeks.  The nuchal fold appears somewhat thickened.      Impression:        Intrauterine demise an of 14.[redacted] weeks gestation. Thickened  nuchal fold. Question fluid around the heart. Low-lying placenta without  previa.    Kinnie Feil, MD  09/08/2016 3:46 PM      .      Medical Decision Making   I am the first provider for this patient.    I reviewed the vital signs, available nursing notes, past medical history, past surgical history, family history and social history.    Vital Signs-Reviewed the patient's vital signs.     Vitals:    09/08/16 1429 09/08/16 1543   BP: 105/70 120/70   Pulse: 98 80   Resp: 18 16   Temp: 99.3 F (37.4 C)    TempSrc: Oral    SpO2: 98% 98%   Weight: 72.6 kg    Height: 5\' 7"  (1.702 m)        Pulse Oximetry Analysis - Normal 98% on RA    Old Medical Records: Old medical records.  Nursing notes.  Previous radiology studies.     Clinical Course/Medical Decision Making:     U/S tech reports no fetal heart beat.     3:40 PM - Spoke with Dr. Effie Shy, accepts for transfer to [facility]. Pt in agreement with plan.     4:42 PM - While waiting for bed placement at [facility], pt states she would like to leave. She has already spoken with her Dr and they gave her the option to schedule her D&E outpatient for Friday. Will cancel transfer to [facility].       Case d/w Maryella Shivers, MD    Diagnosis     Clinical Impression:   1. Spontaneous abortion in second trimester    2. Amenia    3. Hypokalemia        _______________________________    Attestations:  Thea Gist, PA-C, am the primary clinician of record.    Signed by: Sondra Barges, PA-C    _______________________________       Orlene Erm, PA  09/08/16 [address] [place]  09/08/16 1607       Sondra Barges [place], [place]  09/08/16 1643       Maryella Shivers, MD  09/08/16 2227

## 2016-09-08 NOTE — Discharge Instructions (Signed)
Miscarriage, Inevitable    After evaluation, it appears you are having a miscarriage.    It is a fairly common condition in the first trimester (12 weeks) of pregnancy. Up to 1 in 4 of all pregnancies can end in miscarriage. There is nothing that you did that caused the miscarriage and nothing you could have done to prevent it. Doctors can't do anything to stop a miscarriage from happening.    Once a miscarriage starts, nothing can be done to stop it. It is certain you will lose this pregnancy.    It is OK to go home.    A few things to help avoid miscarriage complications:   Get plenty of rest. Avoid high-energy activities like heavy lifting or standing for a long time. Avoid sexual intercourse and douching. If you must go back to work, ask your doctor about work restrictions.   Drink plenty of water to keep you well-hydrated at all times.   If you smoke, you need to quit. Smoking increases the risk of miscarriage. Stopping smoking now may lower the risk of future miscarriage.    Follow up with your obstetrician [facility] doctor) or gynecologist in the next few days. Tell the doctor about your evaluation today. If you don't have an obstetrician/gynecologist or family doctor, tell the medical staff before you leave the emergency department. We will help you to find a doctor.    Expect to have more cramping pain and to pass some blood or small clots. You can also expect to pass fetal tissue.    You may need to return here or go to the nearest Emergency Department if worsening symptoms develop and you can't see the [facility] doctor quickly.    YOU SHOULD SEEK MEDICAL ATTENTION IMMEDIATELY, EITHER HERE OR AT THE NEAREST EMERGENCY DEPARTMENT, IF ANY OF THE FOLLOWING OCCURS:   You have worse pain in the abdomen (belly), pelvis or back.   You pass large clots or have heavy vaginal bleeding that soaks up pads or tampons all the way through (more than one pad per hour).   Fever (temperature higher than 100.44F / 38C),  chills, nausea (sick to your stomach) or vomiting (throwing up).   You feel dizzy, lightheaded or pass out.     Anemia    You have been diagnosed with anemia.    Anemia means "a low red blood cell count." Red blood cells are a part of your blood. These carry oxygen. Blood also has white blood cells, which fight infection and platelets, which help blood to clot.    Symptoms of anemia include fatigue (feeling tired) and weakness. Symptoms also include shortness of breath or chest pain with exercise or even normal activity. Another sign is pale color of the skin, lips and fingernail beds.    Anemia can have many causes. These include:   Ongoing (continual) blood loss. Sometimes there can be a slow "leak" of blood into the bowels. It can also happen with menstruation (menstrual period). Over time, the blood loss adds up. Then your blood count can get too low.   Iron deficiency: Iron is needed to make new red blood cells. Sometimes iron intake is too low for the body's needs.   Vitamin deficiency: The body needs vitamin B12 and folic acid to make new red blood cells. If intake of these vitamins is too low from poor nutrition or too much alcohol, you can become anemic.   Chronic diseases: Some medical illnesses cause low blood count. This is  especially the case for those with generalized inflammation.   Kidney disease: Patients with long-term kidney problems can get anemia.   Blood breakdown: Some diseases cause the blood cells in the blood stream to be destroyed or broken down. This can cause a low blood count.    The exact cause of your anemia is not known at this time. You may have had tests to see why you are anemic. You can get the results soon. See your primary care doctor or the referral doctor for more evaluation.    After an evaluation, the doctor thinks your blood count IS NOT so low that you need a blood transfusion. Follow-up with your regular doctor for more rechecks on your blood count.    YOU  SHOULD SEEK MEDICAL ATTENTION IMMEDIATELY, EITHER HERE OR AT THE NEAREST EMERGENCY DEPARTMENT, IF ANY OF THE FOLLOWING OCCURS:   You get light-headed and dizzy as if about to faint.   You get worsening shortness of breath during regular activities like walking or climbing stairs.   You get chest pain during regular activities like walking or climbing stairs.   IF YOU WERE BLEEDING.Marland KitchenMarland KitchenIf bleeding gets worse.    Potassium-Rich Diet (Edu)    Your doctor wanted you to have the following information about a potassium-rich diet...     You are getting this information because the level of potassium in your blood is low.    Low potassium can be caused by:    Not eating enough potassium. This is a nutrition problem.   Taking water pills (diuretics) for high blood pressure or heart failure.   Your kidneys removing too much potassium from your blood.    If you have high blood pressure, having enough potassium in your blood can help keep it under control. A high potassium diet might not be healthy if you have kidney problems. This is because your kidneys remove extra potassium from your blood, and too much potassium might give them problems.    The normal level of potassium in your blood is 3.5 to 4.5 mEq/L. Anything higher or lower is not normal and can cause problems with your nerves, muscles, and heart.     Some of the symptoms of low potassium are weakness, muscle aches, and cramps. A very high or very low potassium level can kill you. This is because without the right amount of potassium in your blood, your heart can stop beating. It is therefore very important to keep your potassium level in the right range. You can do this by adding potassium to your diet.    Here are some tips to help you eat a diet high in potassium:      Look at food labels and see how much potassium is in the food. If you have healthy kidneys but high blood pressure, you should try to eat 4.7 grams of potassium every day.     Some  fruits DO contain high potassium. Some of these fruits are: prunes, mangoes, kiwis, oranges, bananas, and pomegranates. Grapefruit juice also has a lot of potassium. Some of the vegetables that are high in potassium are: artichokes, squash, Brussels sprouts, spinach, and tomatoes. Check the serving size of the fruits and vegetables you eat.     Red meat (steak, lamb, and pork) often has more potassium than lean meat (chicken and [place]). Most seafood also has high levels of potassium. Read the labels of your meat before you cook it.     Yogurt is a Horticulturist, commercial  product with a lot of potassium. It also fits well into a balanced diet.    Make sure you are also taking all your medications.    If you are on dialysis, make sure you are keeping all of your chair time. This is very important in keeping your potassium level normal.    If you have trouble eating a high-potassium diet, tell your doctor. Your doctor will know a dietician or a nutrition specialist who can help you. In some cases, your doctor might give you potassium pills.

## 2016-09-08 NOTE — ED Triage Notes (Signed)
Began to have cramping 6 days ago, at times milder. Denies spotting. Says is [redacted] weeks pregnant.

## 2016-09-09 ENCOUNTER — Encounter (HOSPITAL_BASED_OUTPATIENT_CLINIC_OR_DEPARTMENT_OTHER): Payer: Self-pay

## 2016-09-09 ENCOUNTER — Other Ambulatory Visit: Payer: Self-pay | Admitting: Obstetrics & Gynecology

## 2016-09-09 MED ORDER — METRONIDAZOLE 500 MG PO TABS
500.0000 mg | ORAL_TABLET | Freq: Two times a day (BID) | ORAL | 0 refills | Status: AC
Start: 2016-09-09 — End: 2016-09-16

## 2016-09-09 MED ORDER — IBUPROFEN 600 MG PO TABS
600.0000 mg | ORAL_TABLET | Freq: Four times a day (QID) | ORAL | 0 refills | Status: DC | PRN
Start: 2016-09-09 — End: 2018-08-15

## 2016-09-09 NOTE — Pre-Procedure Instructions (Addendum)
   Per pt , no tests ordered by surgeon, H/H required  By anesthesia .    Cbc,cmp WNL in chart, from ED visit    + U 09/08/16 from ed visit faxed to surgeon   CHG instruction and contact info emailed to [email or web address]   Pt stated not sure if she will have ride. Explained to pt ride is required . Pt said she will arrange

## 2016-09-09 NOTE — H&P (Addendum)
GYNECOLOGY ADMISSION H&P    Date Time: 09/10/1809:22 AM  Room#WHPRE/WHPRE      Entry for admission on 09/10/2016    Fetal demise  Subjective:  Judy Aguirre is a 36 y.o. [phone] female with fetal demise at 14wk3 days.  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 Pt is Rh positive.     Past Obstetric History:  OB History     Gravida Para Term Preterm AB Living    6 1     3 1     SAB TAB Ectopic Multiple Live Births    3     0            Past Medical History:     Past Medical History:   Diagnosis Date   . Anemia     no meds       Past Surgical History:     Past Surgical History:   Procedure Laterality Date   . INDUCED ABORTION      x2   . LEEP LLETZ  05/21/2013    Procedure: LEEP LLETZ;  Surgeon: Jeanie Cooks, MD;  Location: [facility];  Service: Gynecology;  Laterality: N/A;   . VAGINAL DELIVERY  2000       Family History:     Family History   Problem Relation Age of Onset   . No known problems Mother    . No known problems Father        Social History:     Social History     Social History   . Marital status: Single     Spouse name: N/A   . Number of children: N/A   . Years of education: N/A     Social History Main Topics   . Smoking status: Former Smoker     Years: 3.00   . Smokeless tobacco: Former Neurosurgeon      Comment: social    . Alcohol use No      Comment: Pt stated she stopped drinking while pregnant    . Drug use: No   . Sexual activity: Not Currently     Partners: Male     Other Topics Concern   . Not on file     Social History Narrative   . No narrative on file       Allergies:     Allergies   Allergen Reactions   . Penicillins Other (See Comments)     Was told by parent she was allergic as child       Medications:     Prescriptions Prior to Admission   Medication Sig   . ibuprofen (ADVIL,MOTRIN) 600 MG tablet Take 1 tablet (600 mg total) by mouth every 6 (six) hours as needed for Pain.   . metroNIDAZOLE (FLAGYL) 500 MG tablet Take 1 tablet (500 mg total) by mouth 2  (two) times daily.for 7 days   . Prenatal Multivit-Min-Fe-FA (PRENATAL 1 + IRON PO) Take 1 tablet by mouth daily.   Marland Kitchen terconazole (TERAZOL 3) 80 MG vaginal suppository Place 1 suppository (80 mg total) vaginally nightly.       Review of Systems:     General appearance - alert, well appearing, and in no distress and oriented to person, place, and time  Mental status - alert, oriented to person, place, and time, normal mood, behavior, speech, dress, motor activity, and thought processes  The rest of the system reviews are all normal except otherwise indicated in the Physical  Exams below.     Objective:    Vitals:    09/10/16 1019   BP: 103/60   Pulse: 77   Resp: 16   Temp: 98.1 F (36.7 C)   SpO2: 99%     Physical Examination:    General:   alert, appears stated age and cooperative   Skin:  No lesions   HEENT: Eyes - pupils equal and reactive, extraocular eye movements intact  Mouth - mucous membranes moist, pharynx normal without lesions  Neck - supple, no significant adenopathy  Thyroid: thyroid is normal in size without nodules or tenderness   Lungs:   clear to auscultation bilaterally   Heart:   regular rate and rhythm, S1, S2 normal, no murmur, click, rub or gallop   Breasts:   normal without suspicious masses, skin or nipple changes or axillary nodes and self-exam is taught and encouraged   Abdomen:  soft, non-tender; bowel sounds normal; no masses,  no organomegaly       Labs:     Recent CBC    Recent Labs  Lab 09/08/16  1505   WBC 8.89   RBC 3.90*   Hgb 11.8*   Hematocrit 34.0*       B POS    Recent CMP    Recent Labs  Lab 09/08/16  1505   Glucose 87   BUN 6.0*   Creatinine 0.6   Sodium 138   CO2 22   Calcium 8.8   Albumin 3.2*   AST (SGOT) 10   ALT 12   Globulin 3.1       Assessment:     Patient is a 36 y.o., [id number] fetal demise [redacted]w[redacted]d     Plan:    suction d&C ultrasound guided    Risks, benefits, alternatives and possible complications have been discussed in detail with the patient.  Pre-admission,  admission, and post admission procedures and expectations were discussed in detail.  All questions answered, all appropriate consents will be signed at the Hospital.     Procedure by Dr. Marta Antu MD   Pager [phone] or text page using [facility] Net.   If I do not respond pager within 5 minutes, the pager may not be functioning.  Please contact the operator and have them transfer you to me directly.

## 2016-09-10 ENCOUNTER — Ambulatory Visit (HOSPITAL_BASED_OUTPATIENT_CLINIC_OR_DEPARTMENT_OTHER): Payer: Medicaid Other | Admitting: Anesthesiology

## 2016-09-10 ENCOUNTER — Encounter (HOSPITAL_BASED_OUTPATIENT_CLINIC_OR_DEPARTMENT_OTHER)
Admission: RE | Disposition: A | Discharge status: Home / Self Care | Payer: Self-pay | Source: Ambulatory Visit | Attending: Obstetrics & Gynecology

## 2016-09-10 ENCOUNTER — Encounter: Payer: Medicaid Other | Admitting: Obstetrics & Gynecology

## 2016-09-10 ENCOUNTER — Ambulatory Visit
Admission: RE | Admit: 2016-09-10 | Discharge: 2016-09-10 | Disposition: A | Discharge status: Home / Self Care | Payer: Medicaid Other | Source: Ambulatory Visit | Attending: Obstetrics & Gynecology | Admitting: Obstetrics & Gynecology

## 2016-09-10 ENCOUNTER — Encounter (HOSPITAL_BASED_OUTPATIENT_CLINIC_OR_DEPARTMENT_OTHER): Payer: Self-pay

## 2016-09-10 ENCOUNTER — Ambulatory Visit: Payer: Self-pay

## 2016-09-10 DIAGNOSIS — O09299 Supervision of pregnancy with other poor reproductive or obstetric history, unspecified trimester: Secondary | ICD-10-CM

## 2016-09-10 DIAGNOSIS — O021 Missed abortion: Secondary | ICD-10-CM | POA: Insufficient documentation

## 2016-09-10 DIAGNOSIS — O039 Complete or unspecified spontaneous abortion without complication: Secondary | ICD-10-CM | POA: Diagnosis present

## 2016-09-10 DIAGNOSIS — Z3A14 14 weeks gestation of pregnancy: Secondary | ICD-10-CM

## 2016-09-10 DIAGNOSIS — Z87891 Personal history of nicotine dependence: Secondary | ICD-10-CM | POA: Insufficient documentation

## 2016-09-10 HISTORY — PX: D & E, SUCTION: SHX3669

## 2016-09-10 HISTORY — DX: Anemia, unspecified: D64.9

## 2016-09-10 LAB — TYPE AND SCREEN
AB Screen Gel: NEGATIVE
ABO Rh: B POS

## 2016-09-10 SURGERY — DILATION AND EVACUATION (D&E), SUCTION
Anesthesia: Anesthesia General | Site: Pelvis | Wound class: Clean Contaminated

## 2016-09-10 MED ORDER — KETOROLAC TROMETHAMINE 30 MG/ML IJ SOLN
INTRAMUSCULAR | Status: DC | PRN
Start: 2016-09-10 — End: 2016-09-10
  Administered 2016-09-10: 30 mg via INTRAVENOUS

## 2016-09-10 MED ORDER — FAMOTIDINE 10 MG/ML IV SOLN (WRAP)
INTRAVENOUS | Status: DC | PRN
Start: 2016-09-10 — End: 2016-09-10
  Administered 2016-09-10: 20 mg via INTRAVENOUS

## 2016-09-10 MED ORDER — FENTANYL CITRATE (PF) 50 MCG/ML IJ SOLN (WRAP)
INTRAMUSCULAR | Status: DC | PRN
Start: 2016-09-10 — End: 2016-09-10
  Administered 2016-09-10 (×3): 25 ug via INTRAVENOUS
  Administered 2016-09-10: 75 ug via INTRAVENOUS
  Administered 2016-09-10 (×2): 25 ug via INTRAVENOUS

## 2016-09-10 MED ORDER — LACTATED RINGERS IV SOLN
100.0000 mL/h | INTRAVENOUS | Status: DC
Start: 2016-09-10 — End: 2016-09-10

## 2016-09-10 MED ORDER — PROPOFOL INFUSION 10 MG/ML
INTRAVENOUS | Status: DC | PRN
Start: 2016-09-10 — End: 2016-09-10
  Administered 2016-09-10: 200 mg via INTRAVENOUS

## 2016-09-10 MED ORDER — HYDROMORPHONE HCL 0.5 MG/0.5 ML IJ SOLN
0.5000 mg | INTRAMUSCULAR | Status: DC | PRN
Start: 2016-09-10 — End: 2016-09-10

## 2016-09-10 MED ORDER — STERILE WATER FOR IRRIGATION IR SOLN
Status: DC | PRN
Start: 2016-09-10 — End: 2016-09-10
  Administered 2016-09-10: 2000 mL

## 2016-09-10 MED ORDER — MIDAZOLAM HCL 2 MG/2ML IJ SOLN
INTRAMUSCULAR | Status: DC | PRN
Start: 2016-09-10 — End: 2016-09-10
  Administered 2016-09-10: 2 mg via INTRAVENOUS

## 2016-09-10 MED ORDER — MISOPROSTOL 200 MCG PO TABS
ORAL_TABLET | ORAL | Status: AC
Start: 2016-09-10 — End: ?
  Filled 2016-09-10: qty 2

## 2016-09-10 MED ORDER — EPHEDRINE SULFATE 50 MG/ML IJ/IV SOLN (WRAP)
Status: AC
Start: 2016-09-10 — End: ?
  Filled 2016-09-10: qty 1

## 2016-09-10 MED ORDER — FENTANYL CITRATE (PF) 50 MCG/ML IJ SOLN (WRAP)
25.0000 ug | INTRAMUSCULAR | Status: DC | PRN
Start: 2016-09-10 — End: 2016-09-10

## 2016-09-10 MED ORDER — PHENYLEPHRINE HCL 10 MG/ML IV SOLN (WRAP)
Status: DC | PRN
Start: 2016-09-10 — End: 2016-09-10
  Administered 2016-09-10 (×3): 50 ug via INTRAVENOUS

## 2016-09-10 MED ORDER — FAMOTIDINE 20 MG/2ML IV SOLN
INTRAVENOUS | Status: AC
Start: 2016-09-10 — End: ?
  Filled 2016-09-10: qty 2

## 2016-09-10 MED ORDER — GLYCOPYRROLATE 0.2 MG/ML IJ SOLN
INTRAMUSCULAR | Status: DC | PRN
Start: 2016-09-10 — End: 2016-09-10
  Administered 2016-09-10: 0.2 mg via INTRAVENOUS

## 2016-09-10 MED ORDER — LIDOCAINE HCL (PF) 2 % IJ SOLN
INTRAMUSCULAR | Status: AC
Start: 2016-09-10 — End: ?
  Filled 2016-09-10: qty 5

## 2016-09-10 MED ORDER — DEXAMETHASONE SODIUM PHOSPHATE 4 MG/ML IJ SOLN (WRAP)
INTRAMUSCULAR | Status: DC | PRN
Start: 2016-09-10 — End: 2016-09-10
  Administered 2016-09-10: 4 mg via INTRAVENOUS

## 2016-09-10 MED ORDER — ONDANSETRON HCL 4 MG/2ML IJ SOLN
INTRAMUSCULAR | Status: AC
Start: 2016-09-10 — End: ?
  Filled 2016-09-10: qty 2

## 2016-09-10 MED ORDER — PROPOFOL 10 MG/ML IV EMUL (WRAP)
INTRAVENOUS | Status: AC
Start: 2016-09-10 — End: ?
  Filled 2016-09-10: qty 20

## 2016-09-10 MED ORDER — METOCLOPRAMIDE HCL 5 MG/ML IJ SOLN
10.0000 mg | Freq: Once | INTRAMUSCULAR | Status: DC | PRN
Start: 2016-09-10 — End: 2016-09-10

## 2016-09-10 MED ORDER — OXYCODONE HCL 5 MG PO TABS
5.0000 mg | ORAL_TABLET | Freq: Once | ORAL | Status: DC | PRN
Start: 2016-09-10 — End: 2016-09-10

## 2016-09-10 MED ORDER — GLYCOPYRROLATE 0.2 MG/ML IJ SOLN
INTRAMUSCULAR | Status: AC
Start: 2016-09-10 — End: ?
  Filled 2016-09-10: qty 1

## 2016-09-10 MED ORDER — MIDAZOLAM HCL 2 MG/2ML IJ SOLN
INTRAMUSCULAR | Status: AC
Start: 2016-09-10 — End: ?
  Filled 2016-09-10: qty 2

## 2016-09-10 MED ORDER — ONDANSETRON HCL 4 MG/2ML IJ SOLN
4.0000 mg | Freq: Once | INTRAMUSCULAR | Status: DC | PRN
Start: 2016-09-10 — End: 2016-09-10

## 2016-09-10 MED ORDER — DEXAMETHASONE SODIUM PHOSPHATE 20 MG/5ML IJ SOLN
INTRAMUSCULAR | Status: AC
Start: 2016-09-10 — End: ?
  Filled 2016-09-10: qty 5

## 2016-09-10 MED ORDER — LIDOCAINE HCL 2 % IJ SOLN
INTRAMUSCULAR | Status: DC | PRN
Start: 2016-09-10 — End: 2016-09-10
  Administered 2016-09-10: 100 mg

## 2016-09-10 MED ORDER — LACTATED RINGERS IV SOLN
INTRAVENOUS | Status: DC
Start: 2016-09-10 — End: 2016-09-10

## 2016-09-10 MED ORDER — PROPOFOL 10 MG/ML IV EMUL (WRAP)
INTRAVENOUS | Status: AC
Start: 2016-09-10 — End: ?
  Filled 2016-09-10: qty 50

## 2016-09-10 MED ORDER — SILVER NITRATE-POT NITRATE 75-25 % EX MISC
CUTANEOUS | Status: AC
Start: 2016-09-10 — End: ?
  Filled 2016-09-10: qty 1

## 2016-09-10 MED ORDER — KETOROLAC TROMETHAMINE 60 MG/2ML IM SOLN
INTRAMUSCULAR | Status: AC
Start: 2016-09-10 — End: ?
  Filled 2016-09-10: qty 2

## 2016-09-10 MED ORDER — ONDANSETRON HCL 4 MG/2ML IJ SOLN
INTRAMUSCULAR | Status: DC | PRN
Start: 2016-09-10 — End: 2016-09-10
  Administered 2016-09-10: 4 mg via INTRAVENOUS

## 2016-09-10 MED ORDER — MISOPROSTOL 100 MCG PO TABS
ORAL_TABLET | ORAL | Status: DC | PRN
Start: 2016-09-10 — End: 2016-09-10
  Administered 2016-09-10: 800 ug via RECTAL

## 2016-09-10 MED ORDER — MISOPROSTOL 200 MCG PO TABS
400.0000 ug | ORAL_TABLET | Freq: Once | ORAL | Status: AC
Start: 2016-09-10 — End: 2016-09-10
  Administered 2016-09-10: 10:00:00 400 ug via ORAL

## 2016-09-10 MED ORDER — MISOPROSTOL 200 MCG PO TABS
ORAL_TABLET | ORAL | Status: AC
Start: 2016-09-10 — End: ?
  Filled 2016-09-10: qty 4

## 2016-09-10 MED ORDER — PROPOFOL INFUSION 10 MG/ML
INTRAVENOUS | Status: DC | PRN
Start: 2016-09-10 — End: 2016-09-10
  Administered 2016-09-10: 200 ug/kg/min via INTRAVENOUS

## 2016-09-10 MED ORDER — EPHEDRINE SULFATE 50 MG/ML IJ/IV SOLN (WRAP)
Status: DC | PRN
Start: 2016-09-10 — End: 2016-09-10
  Administered 2016-09-10: 10 mg via INTRAVENOUS

## 2016-09-10 MED ORDER — PHENYLEPHRINE 100 MCG/ML IN NACL 0.9% IV SOSY
PREFILLED_SYRINGE | INTRAVENOUS | Status: AC
Start: 2016-09-10 — End: ?
  Filled 2016-09-10: qty 5

## 2016-09-10 SURGICAL SUPPLY — 17 items
CANNULA BERKELEY CRVD 12MM (Cannula) ×1
CANNULA VAC ASP CRV VCRT 12MM (Cannula) ×2
CANNULA VACUUM ASPIRATION OD12 MM CURVE (Cannula) ×1
CANNULA VACUUM ASPIRATION OD12 MM CURVE VACURETTE (Cannula) IMPLANT
COVER NEOGUARD SURGIBOOT (Drape) ×1
COVER TRANSDUCER L244 CM X W15.2 CM (Drape) ×1
COVER TRANSDUCER L244 CM X W15.2 CM TELESCOPIC FOLD COLOR ELASTIC BAND (Drape) IMPLANT
COVER TRNDCR POLY SRGBT 244X15.2CM LF (Drape) ×2
PAD SANITARY L12.25 IN X W4.25 IN HEAVY ABSORBENT MOISTURE BARRIER (Dressing) IMPLANT
PAD SNTR SLK FLF CRTY 12.25X4.25IN LF NS (Dressing) ×2
PAD-PERI SANITARY REGULAR (Dressing) ×1
TOWEL L27 IN X W17 IN COTTON PREWASH (Other) ×1
TOWEL L27 IN X W17 IN COTTON PREWASH DELINT HIGH ABSORBENT BLUE (Other) IMPLANT
TOWEL OR DISP 10PK (Other) ×1
TOWEL SRG CTTN 27X17IN LF STRL PREWASH (Other) ×2
TUBING SCT PLS 3/8IN 6FT LF STRL SWVL (Tubing) ×1 IMPLANT
TUBING W HDL 3/8X6 STRL (Tubing) ×1

## 2016-09-10 NOTE — Discharge Instr - AVS First Page (Signed)
Suction Curettage(Therapeutic Dilation & Curettage, D&C)  Suction curettage is a procedure to remove the lining and contents of the womb (uterus). It may be done to stop bleeding, control pain, and prevent infection after a miscarriage, abortion, or childbirth.  After the procedure, you should be able to return to your normal routine in 1 or 2 days. You may have some cramping and light bleeding, however. This is normal. These problems should go away within 5 to 7 days. You can expect to have your next period within 4 to 6 weeks.  Home care   If you have pain or cramping, use pain medicine as directed. (Ibuprofen)    If you have light bleeding, use pads instead of tampons. Change these as often as needed.   Avoid douching, using tampons, or having sex until your healthcare provider says it's OK.   Take showers instead of baths for 1 to 2 weeks.  Follow-up care  Follow-up with your healthcare provider as directed in 1-2 weeks.   When to seek medical advice  Call your healthcare provider right away if any of these occur:   Feverof 100.17F (38C) or higher, or as directed by your provider   Heavy bleeding   Bleedingthat lasts longer than 1 week   Pain or cramping worsens instead of getting better   Foul-smelling discharge from the vagina   Passage of anything that resembles tissue from the vagina (if possible, save the tissue and bring it to the healthcare provider)   Weakness, dizziness or fainting  Date Last Reviewed: 12/06/2013   2000-2016 [employer] [address] [place], [place] [address]. All rights reserved. This information is not intended as a substitute for professional medical care. Always follow your healthcare professional's instructions.

## 2016-09-10 NOTE — Transfer of Care (Signed)
Anesthesia Transfer of Care Note    Patient: Judy Aguirre    Procedures performed: Procedure(s):  D & E, SUCTION    Anesthesia type: General LMA    Patient location:Phase I PACU    Last vitals:   Vitals:    09/10/16 1247   BP: 101/59   Pulse: 89   Resp:    Temp:    SpO2: 100%       Post pain: Patient not complaining of pain, continue current therapy      Mental Status:awake    Respiratory Function: tolerating face mask    Cardiovascular: stable    Nausea/Vomiting: patient not complaining of nausea or vomiting    Hydration Status: adequate    Post assessment: no apparent anesthetic complications    Signed by: Windy Fast Volkerding  09/10/16 12:49 PM

## 2016-09-10 NOTE — Op Note (Signed)
D&C OP NOTE    Date Time: 09/10/2016 12:55 PM    Patient Name:   Judy Aguirre    Date of Operation:   09/10/2016    Providers Performing:   Quitman Livings ,MD    Operative Procedure:   Dilation and Curettage at  [redacted]w[redacted]d for fetal demise   Under direct ultrasound guidance     Preoperative Diagnosis:     Problem List Items Addressed This Visit     Fetal demise due to miscarriage    Relevant Orders    Activity as tolerated      Other Visit Diagnoses     Prior pregnancy with fetal demise    -  Primary    Missed abortion        Relevant Medications    miSOPROStol (CYTOTEC) tablet 400 mcg (Completed)        Patient Active Problem List   Diagnosis   . High grade squamous intraepithelial cervical dysplasia   . History of preterm delivery   . HSV-2 seropositive   . Elderly multigravida in second trimester   . Cystic hygroma   . Fetal demise due to miscarriage     Postoperative Diagnosis:     Problem List Items Addressed This Visit     Fetal demise due to miscarriage    Relevant Orders    Activity as tolerated      Other Visit Diagnoses     Prior pregnancy with fetal demise    -  Primary    Missed abortion        Relevant Medications    miSOPROStol (CYTOTEC) tablet 400 mcg (Completed)          Anesthesia:   Paracervical block with 1% Lidocaine    Estimated Blood Loss and Input & Output:   Less than 50 ml    Complications:   None    Condition:   Stable     Procedure in Detail:      Patient was taken to the OR and anesthesia was applied.  Time out was performed.    Patient was put on the dorsal lithotomy position. The cervix was exposed with a speculum. Betadion solution was applied on the cervix. Then a single toothed teneculum was applied on the cervix at 12 o'clock. The cervix was dilated with Shawnie Pons and Hegar dilator to accomodate a 12 suction cannula. A 12 suction curette was inserted into the uterine cavity, then vacuum was applied to clear the entire uterine cavity.  Sopher forceps were used to evacuate  retained placenta.  Suction was repeated with a 12 cannula until endometrial stripe was seen on ultrasound.  Specimen were evaluated for competed products of conception. Gentle sharp curettage was done under ultrasound guidance to confirm uterine grittiness in all four quadrants.  There was minimal bleeding noticed. Patient tolerated the procedure well.  Sponge and instrument counts were correct. Patient was extubated by anesthesia and brought to recovery.     Signed by: Quitman Livings, MD                                                                              [facility]

## 2016-09-10 NOTE — Anesthesia Preprocedure Evaluation (Addendum)
Anesthesia Evaluation    AIRWAY    Mallampati: II    TM distance: >3 FB  Neck ROM: full  Mouth Opening:full   CARDIOVASCULAR    cardiovascular exam normal       DENTAL    no notable dental hx     PULMONARY    pulmonary exam normal     OTHER FINDINGS              Relevant Problems   No relevant active problems               Anesthesia Plan    ASA 2     general                     intravenous induction   Detailed anesthesia plan: general LMA        Post op pain management: per surgeon and PO analgesics    informed consent obtained      pertinent labs reviewed             Signed by: Marland Mcalpine 09/10/16 10:24 AM

## 2016-09-10 NOTE — Discharge Instructions (Signed)

## 2016-09-10 NOTE — Anesthesia Postprocedure Evaluation (Signed)
Anesthesia Post Evaluation    Patient: Judy Aguirre    Procedure(s):  D & E, SUCTION    Anesthesia type: General LMA    Last Vitals:   Vitals:    09/10/16 1310   BP:    Pulse: 71   Resp: 12   Temp:    SpO2: 100%       Patient Location: Phase I PACU  Patient Location: Gyn PACU    Post Pain: Patient not complaining of pain, continue current therapy    Mental Status: awake    Respiratory Function: tolerating face mask    Cardiovascular: stable    Nausea/Vomiting: patient not complaining of nausea or vomiting    Hydration Status: adequate    Post Assessment: no apparent anesthetic complications, no reportable events and no evidence of recall          Anesthesia Qualified Clinical Data Registry 2018    PACU Reintubation  Did the Patient have general anesthesia with intubation: Yes  Did the Patient require reintubation in the PACU?: No  Was this a planned exubation trial (documented in the medical record)?: No    PONV Adult  Is the patient aged 36 or older: Yes  Did the patient receive recieve a general anesthestic: Yes  Does the patient have 3 or more risk factors for PONV? No        PONV Pediatric  Is the patient aged 68-17? No            PACU Transfer Checklist Protocol  Was the patient transferred to the PACU at the conclusion of surgery? Yes  Was a checklist or transfer protocol used? Yes    ICU Transfer Checklist Protocol  Was the patient transferred to the ICU at the conclusion of surgery? No      Post-op Pain Assessment Prior to Anesthesia Care End  Age >=18 and assessed for pain in PACU: Yes  Pacu pain score <7/10: Yes      Perioperative Mortality  Perioperative mortality prior to Anesthesia end time: No    Perioperative Cardiac Arrest  Did the patient have an unanticipated intraoperative cardiac arrest between anesthesia start time and anesthesia end time? No    Unplanned Admission to ICU  Did the patient have an unplanned admission to the ICU (not initially anticipated at anesthesia start time)?  No      Signed by: Marland Mcalpine, 09/10/2016 1:34 PM

## 2016-09-14 ENCOUNTER — Encounter (HOSPITAL_BASED_OUTPATIENT_CLINIC_OR_DEPARTMENT_OTHER): Payer: Self-pay | Admitting: Obstetrics & Gynecology

## 2016-09-14 LAB — LAB USE ONLY - HISTORICAL SURGICAL PATHOLOGY

## 2016-09-16 ENCOUNTER — Ambulatory Visit (HOSPITAL_BASED_OUTPATIENT_CLINIC_OR_DEPARTMENT_OTHER): Payer: Medicaid Other

## 2016-09-16 ENCOUNTER — Other Ambulatory Visit (HOSPITAL_BASED_OUTPATIENT_CLINIC_OR_DEPARTMENT_OTHER): Payer: Medicaid Other

## 2016-09-24 ENCOUNTER — Encounter: Payer: Medicaid Other | Admitting: Obstetrics & Gynecology

## 2017-04-19 DIAGNOSIS — N649 Disorder of breast, unspecified: Secondary | ICD-10-CM

## 2017-04-19 HISTORY — DX: Disorder of breast, unspecified: N64.9

## 2017-05-08 ENCOUNTER — Encounter (HOSPITAL_BASED_OUTPATIENT_CLINIC_OR_DEPARTMENT_OTHER): Payer: Self-pay

## 2017-05-09 ENCOUNTER — Emergency Department: Payer: Charity

## 2017-05-09 ENCOUNTER — Emergency Department
Admission: EM | Admit: 2017-05-09 | Discharge: 2017-05-09 | Disposition: A | Discharge status: Home / Self Care | Payer: Charity | Attending: Emergency Medicine | Admitting: Emergency Medicine

## 2017-05-09 DIAGNOSIS — S62313A Displaced fracture of base of third metacarpal bone, left hand, initial encounter for closed fracture: Secondary | ICD-10-CM | POA: Insufficient documentation

## 2017-05-09 DIAGNOSIS — W009XXA Unspecified fall due to ice and snow, initial encounter: Secondary | ICD-10-CM

## 2017-05-09 DIAGNOSIS — W000XXA Fall on same level due to ice and snow, initial encounter: Secondary | ICD-10-CM | POA: Insufficient documentation

## 2017-05-09 DIAGNOSIS — Z87891 Personal history of nicotine dependence: Secondary | ICD-10-CM | POA: Insufficient documentation

## 2017-05-09 MED ORDER — NAPROXEN 500 MG PO TABS
500.0000 mg | ORAL_TABLET | Freq: Two times a day (BID) | ORAL | 0 refills | Status: DC
Start: 2017-05-09 — End: 2018-08-15

## 2017-05-09 MED ORDER — IBUPROFEN 600 MG PO TABS
600.0000 mg | ORAL_TABLET | Freq: Once | ORAL | Status: AC
Start: 2017-05-09 — End: 2017-05-09
  Administered 2017-05-09: 12:00:00 600 mg via ORAL
  Filled 2017-05-09: qty 1

## 2017-05-09 NOTE — Discharge Instructions (Signed)
Phalanx Fracture, Finger    You have been diagnosed with a fracture of a bone in your finger.    A fracture is a break in a bone. It means the same thing as saying a "broken bone." In general, fractures heal over about 6-8 weeks. Over time, the broken area gets stronger than the area around it. At first, fractures are often treated with a splint. The splint will help keep your finger from moving. However, an orthopedic (bone) doctor may replace it with a cast. Most fractures heal with a splint or cast. Some require surgery. An orthopedic doctor will help decide if your fracture needs surgery.    Fractures are treated with medicine to lower pain and splints or casts to reduce movement. They are also treated with Resting, Icing, Compressing and Elevating the injured area. Remember this as "RICE."    REST: Limit the use of the injured body part.   ICE: By applying ice to the affected area, swelling and pain can be reduced. Place some ice cubes in a re-sealable (Ziploc) bag and add some water. Put a thin washcloth between the bag and the skin. Apply the ice bag to the area for at least 20 minutes. Do this at least 4 times per day. Using the ice for longer times and more frequently is OK. NEVER APPLY ICE DIRECTLY TO THE SKIN.   COMPRESS: Compression means to apply pressure around the injured area such as with a splint, cast or an ACE bandage. Compression decreases swelling and improves comfort. Compression should be tight enough to relieve swelling but not so tight as to decrease circulation. Increasing pain, numbness, tingling, or change in skin color, are all signs of decreased circulation.   ELEVATE: Elevate the injured part. A fractured hand can be elevated by placing the arm in a sling while awake and propped up on pillows while lying down.    You have been given a SPLINT to help with your pain. It will also help keep the injured area from moving. Use the splint until follow-up with the referral orthopedic  (bone) doctor.    Use the SPLINT CARE instructions below often throughout the day:   Check capillary refill (circulation) in the nail beds. Press on the nail bed and then release. It should turn white when you press on it. It should then get pink again in less than 2 seconds after you let go.   Watch to see if the area beyond the splint gets swollen.   The splint may be too tight if the skin of the hand or fingers is very cold, pale or numb to the touch. The wrap holding the splint in place can be loosened. You can also return here or go to the nearest Emergency Department to have it adjusted.    YOU SHOULD SEEK MEDICAL ATTENTION IMMEDIATELY, EITHER HERE OR AT THE NEAREST EMERGENCY DEPARTMENT, IF ANY OF THE FOLLOWING OCCURS:   Severe increase in pain or swelling in the injured area.   New numbness or tingling in or below the injured area.   You develop a cold, pale finger that seems to have blood supply problems.

## 2017-05-09 NOTE — ED Provider Notes (Signed)
EMERGENCY DEPARTMENT HISTORY AND PHYSICAL EXAM     Physician/Midlevel provider first contact with patient: 05/09/17 1206         Date: 05/09/2017  Patient Name: Judy Aguirre    History of Presenting Illness     Chief Complaint   Patient presents with   . Fall   . Hand Pain       History Provided By: Patient    Chief Complaint: Hand pain s/p fall  Duration: 1 day ago  Timing:  Worsening  Location: Left hand  Quality: aching  Severity: Moderate  Exacerbating factors: Pain worse with palpation  Alleviating factors: NA  Associated Symptoms: Swelling and color change.  Pertinent Negatives: Wrist pain or elbow pain.    Additional History: Judy Aguirre is a 37 y.o. female with hx of anemia, presenting to the ED with constant left hand pain s/p slip on ice 1 day ago. Pt states she fell on her extended L hand. Pt reports that this morning she noticed her hand swelling and a purplish color change of her hand. Pt denies wrist pain or elbow pain.     PCP: Pcp, Noneorunknown, MD  SPECIALISTS:    No current facility-administered medications for this encounter.      Current Outpatient Prescriptions   Medication Sig Dispense Refill   . ibuprofen (ADVIL,MOTRIN) 600 MG tablet Take 1 tablet (600 mg total) by mouth every 6 (six) hours as needed for Pain. 30 tablet 0   . naproxen (NAPROSYN) 500 MG tablet Take 1 tablet (500 mg total) by mouth 2 (two) times daily with meals. 30 tablet 0   . Prenatal Multivit-Min-Fe-FA (PRENATAL 1 + IRON PO) Take 1 tablet by mouth daily.     Marland Kitchen terconazole (TERAZOL 3) 80 MG vaginal suppository Place 1 suppository (80 mg total) vaginally nightly. 1 suppository 0       Past History     Past Medical History:  Past Medical History:   Diagnosis Date   . Anemia     no meds       Past Surgical History:  Past Surgical History:   Procedure Laterality Date   . D & E, SUCTION N/A 09/10/2016    Procedure: D & E, SUCTION;  Surgeon: Erenest Blank, MD;  Location: [facility];  Service:  Gynecology;  Laterality: N/A;   . INDUCED ABORTION      x2   . LEEP LLETZ  05/21/2013    Procedure: LEEP LLETZ;  Surgeon: Jeanie Cooks, MD;  Location: [facility];  Service: Gynecology;  Laterality: N/A;   . VAGINAL DELIVERY  2000       Family History:  Family History   Problem Relation Age of Onset   . No known problems Mother    . No known problems Father        Social History:  Social History   Substance Use Topics   . Smoking status: Former Smoker     Years: 3.00   . Smokeless tobacco: Former Neurosurgeon      Comment: social    . Alcohol use Yes       Allergies:  Allergies   Allergen Reactions   . Penicillins Other (See Comments)     Was told by parent she was allergic as child       Review of Systems     Review of Systems   Constitutional: Negative for chills and fever.   Respiratory: Negative for shortness of breath.  Cardiovascular: Negative for chest pain.   Gastrointestinal: Negative for diarrhea, nausea and vomiting.   Musculoskeletal:        Positive for left hand pain and swelling.   Skin: Positive for color change.   All other systems reviewed and are negative.      Physical Exam   BP 112/75   Pulse 76   Temp 98.2 F (36.8 C)   Resp 18   Ht 5\' 7"  (1.702 m)   Wt 74.8 kg   LMP 04/24/2017 (Exact Date)   SpO2 99%   BMI 25.84 kg/m     Physical Exam   Constitutional: She is oriented to person, place, and time. She appears well-developed and well-nourished. No distress.   HENT:   Head: Normocephalic and atraumatic.   Mouth/Throat: Oropharynx is clear and moist.   Eyes: Conjunctivae and EOM are normal.   Neck: Normal range of motion. Neck supple.   Cardiovascular: Normal rate, regular rhythm and normal heart sounds.  Exam reveals no gallop and no friction rub.    No murmur heard.  Pulmonary/Chest: Effort normal and breath sounds normal. No respiratory distress. She has no wheezes. She has no rales.   Abdominal: Soft. Bowel sounds are normal. She exhibits no distension. There is no tenderness. There is  no rebound and no guarding.   Musculoskeletal: Normal range of motion. She exhibits no edema.        Left hand: She exhibits tenderness and swelling.   Swelling to dorsal aspect of left hand. TTP to 1st and 2nd MCP joints.    Neurological: She is alert and oriented to person, place, and time.   Skin: Skin is warm and dry.   Psychiatric: She has a normal mood and affect. Her behavior is normal.   Nursing note and vitals reviewed.      Diagnostic Study Results     Labs -     Results     ** No results found for the last 24 hours. **          Radiologic Studies -   Radiology Results (24 Hour)     Procedure Component Value Units Date/Time    Hand Left PA Lateral And Oblique [[id number]] Collected:  05/09/17 1234    Order Status:  Completed Updated:  05/09/17 1241    Narrative:       INDICATION: fall, injury to digit 2 and 3 and palm of left hand    TECHNIQUE: 3 views of the left hand were obtained.     FINDINGS: There is a mildly displaced fracture of the base of the third  proximal phalanx at the ulnar aspect measuring 5-10 mm and extending to  the third MCP joint. No dislocation or subluxation demonstrated.  Soft  tissues are unremarkable.      Impression:        Mildly displaced fracture of the base of the third proximal  phalanx.    Gustavus Messing, MD   05/09/2017 12:37 PM      .    Medical Decision Making   I am the first provider for this patient.    I reviewed the vital signs, available nursing notes, past medical history, past surgical history, family history and social history.    Vital Signs-Reviewed the patient's vital signs.     Patient Vitals for the past 12 hrs:   BP Temp Pulse Resp   05/09/17 1202 112/75 98.2 F (36.8 C) 76 18  Pulse Oximetry Analysis - Normal      Procedures:    ------------------ PROCEDURE: INITIAL FRACTURE CARE  ------------------    Performed by the emergency provider  Consent:  Informed consent, after discussion of the risks, benefits, and alternatives to the procedure, was  obtained  Indication: The patient has a fracture of the base of third metacarpal bone of left hand.     Procedure: The fracture was displaced and did not require manipulation in the ED.   Assessment: The fracture was appropriately immobilized and was neurovascularly intact at discharge.    ------------------- PROCEDURE: SPLINT APPLICATION  -------------------    Applied by Tech, supervised by emergency provider.   Location: Left hand  Procedure: The area of the splint was appropriately positioned.  Volar splint was applied for definitive management.   Post-procedure: Good position.  Neurovascular status remains intact.  Patient tolerated the procedure well with no immediate complications.      Old Medical Records: Nursing notes.     ED Course:     12:07 PM- Discussed ED plan with pt including imaging and pain medication. Pt is agreeable.    12:58 PM - Updated pt on all results. Discussed f/u with hand surgery, home self care, discharge instructions, and return precautions with patient. Possibility of evolving illness reviewed. All questions solicited and addressed. Patient states understanding and amenable to discharge.     Provider Notes: Pt with fracture at the base of the third metacarpal. Splint will be applied. Pt to follow up with orthopedist.     Diagnosis     Clinical Impression:   1. Closed displaced fracture of base of third metacarpal bone of left hand, initial encounter    2. Fall due to slipping on ice or snow, initial encounter        Treatment Plan:   ED Disposition     ED Disposition Condition Date/Time Comment    Discharge  Mon May 09, 2017  1:05 PM [name] discharge to home/self care.    Condition at disposition: Stable            _______________________________      Attestations: This note is prepared by Ammie Ferrier, acting as scribe for Mariane Duval, MD. The scribe's documentation has been prepared under my direction and personally reviewed by me in its entirety.  I  confirm that the note above accurately reflects all work, treatment, procedures, and medical decision making performed by me.    _______________________________       Verlee Rossetti, MD  05/09/17 1324

## 2017-05-09 NOTE — ED Triage Notes (Signed)
Judy Aguirre is a 37 y.o. female presenting from home with reports of slip and fall last night around 10pm . Pt reports injuring left hand digit 2 and 3, and pt has bruising and swelling of proximal phalanges and bruising on the distal palm . Pt denies taking anything for pain today. Fingers are warm and dry at the tips with CRT <2 seconds. Pt reports she is able to bend fingers but it is incredibly painful.

## 2017-05-09 NOTE — ED Notes (Signed)
Pt ambulatory to room without assistance. Pt ID band verified. Ice applied in triage. RN at bedside.

## 2017-06-02 ENCOUNTER — Emergency Department (HOSPITAL_COMMUNITY): Payer: Worker's Compensation

## 2017-06-02 ENCOUNTER — Emergency Department (HOSPITAL_COMMUNITY)
Admission: EM | Admit: 2017-06-02 | Discharge: 2017-06-02 | Disposition: A | Discharge status: Home / Self Care | Payer: Worker's Compensation | Attending: Emergency Medicine | Admitting: Emergency Medicine

## 2017-06-02 ENCOUNTER — Encounter (HOSPITAL_COMMUNITY): Payer: Self-pay | Admitting: Emergency Medicine

## 2017-06-02 DIAGNOSIS — Y929 Unspecified place or not applicable: Secondary | ICD-10-CM | POA: Insufficient documentation

## 2017-06-02 DIAGNOSIS — S62613A Displaced fracture of proximal phalanx of left middle finger, initial encounter for closed fracture: Secondary | ICD-10-CM | POA: Diagnosis not present

## 2017-06-02 DIAGNOSIS — Y939 Activity, unspecified: Secondary | ICD-10-CM | POA: Diagnosis not present

## 2017-06-02 DIAGNOSIS — Z041 Encounter for examination and observation following transport accident: Secondary | ICD-10-CM | POA: Diagnosis present

## 2017-06-02 DIAGNOSIS — S62609A Fracture of unspecified phalanx of unspecified finger, initial encounter for closed fracture: Secondary | ICD-10-CM

## 2017-06-02 DIAGNOSIS — S92114A Nondisplaced fracture of neck of right talus, initial encounter for closed fracture: Secondary | ICD-10-CM | POA: Insufficient documentation

## 2017-06-02 DIAGNOSIS — Y999 Unspecified external cause status: Secondary | ICD-10-CM | POA: Insufficient documentation

## 2017-06-02 MED ORDER — OXYCODONE-ACETAMINOPHEN 5-325 MG PO TABS
1.0000 | ORAL_TABLET | Freq: Once | ORAL | Status: AC
  Administered 2017-06-02: 1 via ORAL
  Filled 2017-06-02: qty 1

## 2017-06-02 MED ORDER — HYDROCODONE-ACETAMINOPHEN 5-325 MG PO TABS: 1.0000 | ORAL_TABLET | ORAL | 0 refills | Status: AC | PRN
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
## 2017-06-02 NOTE — Discharge Instructions (Addendum)
Please read instructions below. Apply ice to your ankle and finger for 20 minutes at a time. Elevate your ankle as much as possible.  Do not bear any weight on your right foot.  Keep your splint clean and dry. It is important that you follow-up with an orthopedic surgeon as soon as you get home, as your ankle likely warrants surgical repair.  It is also important that you follow-up on your finger fracture, to ensure proper healing. You can take hydrocodone every 4-6 hours as needed for pain.  Do not take Tylenol, drink alcohol, or drive while taking this medication.  You can take Advil/ibuprofen every 6 hours as needed for additional pain relief, and swelling relief. Return to the ER for new or concerning symptoms.  [Patient discharged with her boyfriend, Hale BogusMario Henderson.]

## 2017-06-06 ENCOUNTER — Emergency Department
Admission: EM | Admit: 2017-06-06 | Discharge: 2017-06-06 | Disposition: A | Discharge status: Home / Self Care | Payer: Self-pay | Attending: Emergency Medicine | Admitting: Emergency Medicine

## 2017-06-06 DIAGNOSIS — Z538 Procedure and treatment not carried out for other reasons: Secondary | ICD-10-CM | POA: Insufficient documentation

## 2017-07-06 ENCOUNTER — Other Ambulatory Visit: Payer: Self-pay | Admitting: Physician Assistant

## 2017-10-19 ENCOUNTER — Emergency Department
Admission: EM | Admit: 2017-10-19 | Discharge: 2017-10-19 | Disposition: A | Discharge status: Home / Self Care | Payer: Medicaid Other | Attending: Emergency Medicine | Admitting: Emergency Medicine

## 2017-10-19 ENCOUNTER — Emergency Department: Payer: Medicaid Other

## 2017-10-19 DIAGNOSIS — N6311 Unspecified lump in the right breast, upper outer quadrant: Secondary | ICD-10-CM | POA: Insufficient documentation

## 2017-10-19 DIAGNOSIS — Z87891 Personal history of nicotine dependence: Secondary | ICD-10-CM | POA: Insufficient documentation

## 2017-10-19 DIAGNOSIS — Z803 Family history of malignant neoplasm of breast: Secondary | ICD-10-CM | POA: Insufficient documentation

## 2017-10-19 DIAGNOSIS — D649 Anemia, unspecified: Secondary | ICD-10-CM | POA: Insufficient documentation

## 2017-10-19 DIAGNOSIS — N63 Unspecified lump in unspecified breast: Secondary | ICD-10-CM

## 2017-10-19 MED ORDER — ACETAMINOPHEN 500 MG PO TABS
1000.0000 mg | ORAL_TABLET | Freq: Once | ORAL | Status: DC
Start: 2017-10-19 — End: 2017-10-19

## 2017-10-19 NOTE — ED Triage Notes (Signed)
Pt presents to ED with complaint of R breast pain, pt states she noticed a lump in her outer R breast this morning. PT reports she felt discomfort to her R breast yesterday but now the breast is painful to palpation and noticed the lump. Pt denies SOB.

## 2017-10-19 NOTE — ED Notes (Signed)
Spoke with Dr Fransico Michael about holding on bloodwork until she evaluates patient due to complaint. Dr Fransico Michael agreeable with plan

## 2017-10-19 NOTE — Discharge Instructions (Signed)
Thank you for allowing [place] the privilege of taking care of you. Our goal is to provide you with EXCELLENT care. I hope we were able to accomplish that goal.    If there are any questions or concerns at all, we are always a resource for you.     You can contact us at the following numbers:  [facility]: [phone]  [facility]: [phone]    Additionally, you can contact Dr. Fransico Michael with any questions as below:    - 24/7 voice message line: [phone] (calls will be returned within 24 hours)  - email: [email or web address]    Please know that the diagnosis in the emergency department is often preliminary and a specific diagnosis cannot always be made. Every disease has a progression and may take time before it is recognized. It is very important to return to the ER or see you primary care doctor if you do not improve and especially if your symptoms worsen or change. IF YOU SENSE SOMETHING IS NOT RIGHT, DO NOT HESITATE TO RETURN TO THE ER.    Please take the following as needed for your symptoms:    - tylenol 650 mg every 4 hours for pain  - motrin 600-800 mg every 6 hours for pain (take with food)      Breast Lump    You have been seen for a lump in your breast.    Breast lumps can be benign (non-cancerous). They can also be malignant (cancerous). It can be hard to tell the difference between dangerous and non-dangerous lumps. Therefore, more testing is needed. This kind of testing is done as an outpatient. This means the tests are scheduled for a later time without staying in the hospital. These include:   Ultrasonography: An ultrasound of the breast lump. This is to see if it is filled with fluid or is solid.   Mammography: A mammogram is a special x-ray of the breast. It finds lumps that are suspicious for cancer.   Biopsy: With a biopsy, a sample of the lump is taken. It is usually taken with a needle. It then goes to the lab for analysis.    Fibrocystic Breast Disease: This is a  common condition. It affects about 20% (2 out of 10) of women between the ages of puberty and 50 years. It usually goes away after menopause. It is a benign condition (not cancerous). However, it can be hard to tell the lumps apart from lumps caused by cancer. Because of this, other diagnostic tests should be done. These are to make sure the lumps are truly fibrocystic disease. Some common fibrocystic disease symptoms are:   Multiple lumps are common. Both breasts are usually affected. Solitary (single) lumps are also seen.   Lumps often change size during menstruation (periods). They get larger before periods and shrink after.   During menstruation, breasts may be generally tender.    Cancerous Breast Lumps: It is often hard to tell cancerous lumps apart from those caused by less dangerous problems. More testing will be needed. These will fully evaluate the breast lumps.    Mammogram recommendations: Recommendations about when to have the first mammogram and how often to get repeat mammograms may change over time. Ask your doctor for more information on mammography.   Current recommendations are for routine mammograms once a year starting at the age of 74.   If you are at high risk for breast cancer, get a mammogram when you are younger.  This could be around the age of 57. High risk means you have a strong family history of breast or ovarian cancer or have had chest radiation treatment.

## 2017-10-19 NOTE — ED Provider Notes (Signed)
EMERGENCY DEPARTMENT HISTORY AND PHYSICAL EXAM     Physician/Midlevel provider first contact with patient: 10/19/17 1210         Date: 10/19/2017  Patient Name: Judy Aguirre    History of Presenting Illness     Chief Complaint   Patient presents with   . Breast Pain       History Provided By: Patient and Patient's Mother     Chief Complaint: Breast lump   Onset: noticed 2 days ago  Timing: acute   Location: Outer side of right breast   Quality: hard and painful to touch  Radiation (for pain): None   Severity: Moderate  Exacerbating factors: None  Alleviating factors: None  Associated Symptoms: night sweats, decreased appetite   Pertinent Negatives: no nipple discharge or bleeding, nipple inversion, breast skin changes, unexpected weight loss, fevers, chills, abd pain, n/v/d, back pain, dysuria    Additional History: Judy Aguirre is a 37 y.o. female with PMH of anemia presents with 'lump' on side of right breast that she noticed 2 days ago. Pt  reports that the lump is 'hard and uncomfortable'.  Patient reports occasional night sweats and loss of appetite. Pt has a fhx of breast cancer on both her mother's and father's side. Her mother is a breast cancer survivor, who was diagnosed with breast cancer last year at the age of 63. Pt last saw her gynecologist 6 months ago. Pt denies nipple discharge or bleeding, nipple inversion, breast skin changes, unexpected weight loss, fevers, chills, abd pain, n/v/d, back pain, dysuria.       PCP: Pcp, None, MD    No current facility-administered medications for this encounter.      Current Outpatient Prescriptions   Medication Sig Dispense Refill   . ibuprofen (ADVIL,MOTRIN) 600 MG tablet Take 1 tablet (600 mg total) by mouth every 6 (six) hours as needed for Pain. 30 tablet 0   . naproxen (NAPROSYN) 500 MG tablet Take 1 tablet (500 mg total) by mouth 2 (two) times daily with meals. 30 tablet 0   . Prenatal Multivit-Min-Fe-FA (PRENATAL 1 + IRON PO) Take 1  tablet by mouth daily.     Marland Kitchen terconazole (TERAZOL 3) 80 MG vaginal suppository Place 1 suppository (80 mg total) vaginally nightly. 1 suppository 0       Past History     Past Medical History:  Past Medical History:   Diagnosis Date   . Anemia     no meds       Past Surgical History:  Past Surgical History:   Procedure Laterality Date   . D & E, SUCTION N/A 09/10/2016    Procedure: D & E, SUCTION;  Surgeon: Erenest Blank, MD;  Location: [facility];  Service: Gynecology;  Laterality: N/A;   . INDUCED ABORTION      x2   . LEEP LLETZ  05/21/2013    Procedure: LEEP LLETZ;  Surgeon: Jeanie Cooks, MD;  Location: [facility];  Service: Gynecology;  Laterality: N/A;   . VAGINAL DELIVERY  2000       Family History:  Family History   Problem Relation Age of Onset   . No known problems Mother    . No known problems Father        Social History:  Social History   Substance Use Topics   . Smoking status: Former Smoker     Years: 3.00   . Smokeless tobacco: Former Neurosurgeon  Comment: social    . Alcohol use Yes       Allergies:  Allergies   Allergen Reactions   . Penicillins Other (See Comments)     Was told by parent she was allergic as child       Review of Systems     Review of Systems   Constitutional: Positive for appetite change (decreased) and diaphoresis (nocturnal ). Negative for chills, fever and unexpected weight change.   HENT: Negative for congestion, sore throat and voice change.    Eyes: Negative for redness and visual disturbance.   Respiratory: Negative for cough, chest tightness and shortness of breath.    Cardiovascular: Negative for chest pain, palpitations and leg swelling.   Gastrointestinal: Negative for abdominal pain, diarrhea, nausea and vomiting.   Endocrine: Negative for polydipsia and polyphagia.   Genitourinary: Negative for difficulty urinating, dysuria, flank pain, frequency, urgency and vaginal bleeding.   Musculoskeletal: Negative for back pain, gait problem, neck pain and neck  stiffness.        No nipple discharge.  No nipple inversion.    Skin: Negative for color change and rash.        + breast mass   Allergic/Immunologic: Negative for immunocompromised state.   Neurological: Negative for dizziness, syncope, weakness, numbness and headaches.   Hematological: Negative for adenopathy.   Psychiatric/Behavioral: Negative for agitation and confusion. The patient is not nervous/anxious.    All other systems reviewed and are negative.      Physical Exam   BP 113/60   Pulse 83   Temp 98.2 F (36.8 C) (Oral)   Resp 16   Wt 75 kg   SpO2 98%   BMI 25.90 kg/m     Physical Exam   Constitutional: She is oriented to person, place, and time. She appears well-developed and well-nourished. No distress.   Nontoxic, but anxious.   HENT:   Head: Normocephalic and atraumatic.   Mouth/Throat: Oropharynx is clear and moist.   Eyes: Pupils are equal, round, and reactive to light. Conjunctivae are normal.   Neck: Normal range of motion. Neck supple. No JVD present.   Cardiovascular: Normal rate, regular rhythm, normal heart sounds and intact distal pulses.    Pulmonary/Chest: Effort normal and breath sounds normal. No respiratory distress.   Abdominal: Soft. Bowel sounds are normal. She exhibits no distension. There is no tenderness.   Musculoskeletal: Normal range of motion. She exhibits no edema or tenderness.   Neurological: She is alert and oriented to person, place, and time. She exhibits normal muscle tone.   Skin: Skin is warm and dry. No rash noted. She is not diaphoretic.   Left breast with no tenderness, masses or skin changes.  Right breast with palpable firm, well-circumscribed mass in the upper outer quadrant. No overlying skin erythema, induration or fluctuance. Nipple without inversion or drainage.  No axillary lymphadenopathy bilaterally.   Psychiatric: Her behavior is normal.   Nursing note and vitals reviewed.             Diagnostic Study Results     Labs -     Results     ** No results  found for the last 24 hours. **          Radiologic Studies -   Radiology Results (24 Hour)     Procedure Component Value Units Date/Time    Chest AP Portable [[id number]] Collected:  10/19/17 1214    Order Status:  Completed Updated:  10/19/17 1218  Narrative:       History: chest pain    Technique: Single Portable View    Comparison: 09/16/2013    Findings:  The lungs appear clear.  There is no pneumothorax.  The heart is normal in size.    The mediastinum is within normal limits.             Impression:        No active disease is seen in the chest.    Laurena Slimmer, MD   10/19/2017 12:14 PM      .      Medical Decision Making   I am the first provider for this patient.    I reviewed the vital signs, available nursing notes, past medical history, past surgical history, family history and social history.    Vital Signs: Reviewed the patient's vital signs.     Patient Vitals for the past 12 hrs:   BP Temp Pulse Resp   10/19/17 1149 113/60 98.2 F (36.8 C) 83 16       Pulse Oximetry Analysis:  98% on RA    Cardiac Monitor:  Rate:    89    bpm  Rhythm:  Normal Sinus Rhythm     EKG:  Interpreted by the Emergency Physician.   Time Interpreted: 1145   Rate: 89   Rhythm: Normal Sinus Rhythm    Interpretation: QRS 78, QTc 396, no ST elevations or depressions   Comparison: No prior study is available for comparison.    Clinical Decision Support:     Old Medical Records: Nursing notes.  Old records.  Last ED visit was on 05/09/17 for fall and hand pain. Pt was dx with closed displaced fracture of third MCP of left hand.     ED Course:     12:14 PM - Pt was seen and examined with mother at bedside. Patient currently does not have a primary care doctor, so referred [facility] to [facility] her care.  Breast surgeon referral given as well.  Patient expresses understanding. Home self care, discharge instructions, and return precautions discussed extensively with patient and mother. Possibility of evolving  illness reviewed. All questions solicited and addressed. Patient is amenable to discharge.       Provider Notes: 78F presents with right breast mass that she noticed 2 days ago.  Mother diagnosed with breast cancer last year, patient is concerned about the same.  Exam as above, no skin changes, no evidence of infectious process, no axillary lymphadenopathy. Explained to patient that she will need an urgent outpatient mammogram to characterize the mass, followed by an appointment with breast surgeon.  Referral to [facility] and breast surgeon provided.  CXR in the ED without acute pathology, no lung masses, no bony mets.  Discussed information with patient and reassured. Pt is comfortable going home.  Included list of OTC meds for symptomatic control. Talked about symptoms that would prompt a return to the ED and patient expressed understanding. Pt discharged with PMD, per surgeon and PRN OB/GYN follow up. Pt agrees with the plan and is comfortable with discharge from the ED.      Diagnosis     Clinical Impression:   1. Breast lump in upper outer quadrant        Treatment Plan:   ED Disposition     ED Disposition Condition Date/Time Comment    Discharge  Wed Oct 19, 2017 12:32 PM [name] discharge to home/self  care.    Condition at disposition: Stable            _______________________________    Attestations: This note is prepared by Gaylyn Rong acting as scribe for Ardis Hughs, MD, PHD. The scribe's documentation has been prepared under my direction and personally reviewed by me in its entirety.  I confirm that the note above accurately reflects all work, treatment, procedures, and medical decision making performed by me.    _______________________________                Juliane Poot, MD PhD  10/20/17 [phone]

## 2017-10-20 ENCOUNTER — Telehealth: Payer: Self-pay | Admitting: Emergency Medicine

## 2017-10-20 LAB — ECG 12-LEAD
Atrial Rate: 89 {beats}/min
P Axis: 48 degrees
P-R Interval: 160 ms
Q-T Interval: 326 ms
QRS Duration: 78 ms
QTC Calculation (Bezet): 396 ms
R Axis: 54 degrees
T Axis: 33 degrees
Ventricular Rate: 89 {beats}/min

## 2017-10-20 NOTE — Telephone Encounter (Signed)
As part of routine follow up, spoke with patient who reported she is 'doing all right'. She made an apptm with [facility] for Monday. Discussed returning to ED if symptoms worsen. Pt expressed understanding.

## 2017-10-21 ENCOUNTER — Telehealth (INDEPENDENT_AMBULATORY_CARE_PROVIDER_SITE_OTHER): Payer: Self-pay

## 2017-10-21 NOTE — Telephone Encounter (Signed)
Patient called to inquire about Medicaid enrollment.  Advised patient to arrive in-clinic at [facility] @ 2:30pm in order to meet with Medicaid care link specialist.  Directed patient to bring letter of support and financial/ID.    Simmie Davies RN, BSN  Case Therapist, art [facility]  [address]  [place], [place] [address]  [email or web address]  [phone] F [phone]

## 2017-10-24 ENCOUNTER — Ambulatory Visit (INDEPENDENT_AMBULATORY_CARE_PROVIDER_SITE_OTHER): Payer: Medicaid Other | Admitting: Nurse Practitioner

## 2017-10-24 ENCOUNTER — Telehealth: Payer: Self-pay

## 2017-10-24 VITALS — BP 101/70 | HR 75 | Temp 98.5°F | Resp 12 | Ht 67.01 in | Wt 155.0 lb

## 2017-10-24 DIAGNOSIS — N63 Unspecified lump in unspecified breast: Secondary | ICD-10-CM

## 2017-10-24 HISTORY — DX: Unspecified lump in unspecified breast: N63.0

## 2017-10-24 NOTE — Progress Notes (Signed)
Chief Complaint: Breast Lump    History of Present Illness:     This patient is a 37 y.o. female presented with right breast lump noticed two days prior to her ED Visit. Patient has family history of breast cancer with her mother and her father side as well. Her mother is a breast cancer survivor who was diagnosed with breast cancer last year at the age of 69.     Past Medical History:   Diagnosis Date   . Anemia     no meds       Past Surgical History:   Procedure Laterality Date   . D & E, SUCTION N/A 09/10/2016    Procedure: D & E, SUCTION;  Surgeon: Erenest Blank, MD;  Location: [facility];  Service: Gynecology;  Laterality: N/A;   . INDUCED ABORTION      x2   . LEEP LLETZ  05/21/2013    Procedure: LEEP LLETZ;  Surgeon: Jeanie Cooks, MD;  Location: [facility];  Service: Gynecology;  Laterality: N/A;   . VAGINAL DELIVERY  2000       Family History   Problem Relation Age of Onset   . No known problems Mother    . No known problems Father        Social History     Social History   . Marital status: Single     Spouse name: N/A   . Number of children: N/A   . Years of education: N/A     Occupational History   . Not on file.     Social History Main Topics   . Smoking status: Former Smoker     Years: 3.00   . Smokeless tobacco: Former Neurosurgeon      Comment: social    . Alcohol use Yes   . Drug use: No   . Sexual activity: Not Currently     Partners: Male     Other Topics Concern   . Not on file     Social History Narrative   . No narrative on file       Allergies   Allergen Reactions   . Penicillins Other (See Comments)     Was told by parent she was allergic as child         Current Outpatient Prescriptions:   .  ibuprofen (ADVIL,MOTRIN) 600 MG tablet, Take 1 tablet (600 mg total) by mouth every 6 (six) hours as needed for Pain., Disp: 30 tablet, Rfl: 0  .  naproxen (NAPROSYN) 500 MG tablet, Take 1 tablet (500 mg total) by mouth 2 (two) times daily with meals., Disp: 30 tablet, Rfl: 0  .  Prenatal  Multivit-Min-Fe-FA (PRENATAL 1 + IRON PO), Take 1 tablet by mouth daily., Disp: , Rfl:   .  terconazole (TERAZOL 3) 80 MG vaginal suppository, Place 1 suppository (80 mg total) vaginally nightly., Disp: 1 suppository, Rfl: 0    Review of Systems   Constitutional: Negative.    Skin:        Right breast lump no nipple inversion or drainage       All systems reviewed and negative except as described above.    Physical Exam:     Vitals:    10/24/17 1510   BP: 101/70   Pulse: 75   Resp: 12   Temp: 98.5 F (36.9 C)   SpO2: 98%     Wt Readings from Last 3 Encounters:   10/24/17 70.3 kg (155  lb)   10/19/17 75 kg (165 lb 5.5 oz)   05/09/17 74.8 kg (165 lb)       General: awake, alert, oriented x 3  HEENT: perrla, eomi, sclera anicteric,oropharynx clear without lesions, mucous membranes moist  Neck: supple, no lymphadenopathy, no thyromegaly, no JVD, no carotid bruits  Cardiovascular: S1, S2, regular rate and rhythm, no murmurs, rubs, or gallops  Lungs: clear to auscultation bilaterally, without wheezing, rhonchi, or rales  Abdomen: soft, non tender, normal bowel sounds  Extremities: no clubbing, cyanosis, or edema  Skin: no rashes or lesions noted  Breast: Left breast with no tenderness or mass. Right breast with palpable firm, well circumscribed mass in the upper outer quadrant at 10:00 pm position, no erythema or induration, no nipple inversion or drainage and no axillary LAD bilaterally.      Diagnostics:     Lab Results   Component Value Date    WBC 8.89 09/08/2016    HGB 11.8 (L) 09/08/2016    HCT 34.0 (L) 09/08/2016    PLT 296 09/08/2016    ALT 12 09/08/2016    AST 10 09/08/2016    NA 138 09/08/2016    K 3.4 (L) 09/08/2016    CL 108 09/08/2016    CREAT 0.6 09/08/2016    BUN 6.0 (L) 09/08/2016    CO2 22 09/08/2016    TSH 1.250 12/17/2015    GLU 87 09/08/2016    HGBA1C 5.5 12/17/2015       Chest Ap Portable    Result Date: 10/19/2017   No active disease is seen in the chest. Laurena Slimmer, MD 10/19/2017 12:14 PM      Active  Problems:     Patient Active Problem List   Diagnosis   . High grade squamous intraepithelial cervical dysplasia   . History of preterm delivery   . HSV-2 seropositive   . Elderly multigravida in second trimester   . Cystic hygroma   . Fetal demise due to miscarriage       Assessment/Plan:     37 y.o. female with right breast lump    Diagnoses and all orders for this visit:    Breast lump in upper outer quadrant  -     Ambulatory referral to Breast Clinic  -     Mammo Digital Diagnostic Bilateral W Cad; Future    Follow up in two weeks. Patient was advised to call [facility] and make an appointment for evaluation of her right breast lump.     Follow up: Yes  Discharged from [facility]?: No  Medical Home Referred to/Referred Back to: [facility]     Med Rec Complete: Yes  Referred to [facility] for diabetes A1c > 8.5: N/A  Given commitment to care documents when appropriate: N/A   Teach back for HF, COPD, DM, AMI, PNA: N/A

## 2017-10-24 NOTE — Telephone Encounter (Signed)
[facility] New Patient Coordinator Note    Physician/Location Preference:    Location Preference: [facility]    Physician Preference: First Available (female only)    Referral:    Referring Provider: Edward Jolly, NP    Is Referral required per insurance?     History:    Personal Hx of Cancer: No     Prior Chemotherapy - No   Prior Radiation - No    Prior Surgery related to Cancer - No    Family Hx of Cancer : Yes - breast cancer (mother, aunt, cousins)    Biopsy History:    No    Imaging History:    Prior Imaging: No       Type of Imaging:    Location Performed: Patient to call and schedule    Other:     Are there patient owned records that will be brought to the first appointment?No    Has the Appointment been scheduled? Yes 7/25 with Dr. Smitty Cords

## 2017-10-24 NOTE — Patient Instructions (Signed)
Breast Lump, Uncertain Cause    A lump was found in your breast. Most breast lumps arenot cancer.Theymay be causedby normal changes in the breast tissue due to hormone variations that occur with your menstrual cycle. Some women may form lumps that are painful and tender. Others may form lumps that are painless.  At this time, is not possible to be certain of the cause of your lump without further evaluation. This could include:   Another exam by your healthcare provider or a gynecologist   Imaging tests, such as a mammogram or ultrasound   Biopsy(procedure to remove small tissue samples from the breast lump)  Your healthcare provider will explain any additional testing that is needed. Be sure to get answers to any questions you may have.  Home care  Until a diagnosis is made, you may be advised to do the following:   If you are having breast pain:  ? Take an over-the-counter pain reliever, if directed to by your provider.  ? Wear a well-fitted bra or sports bra for extra support. If you have breast pain at night, try wearing the bra during sleep.  ? Apply a warm compress (towel soaked in warm water) to the breast.You may also use a hot water bottle.   Check your breasts each day.Keep a log of whether the lump seems to be changing in size or tenderness with your period. This can help your healthcare provider make the correct diagnosis.  Follow-up care  Follow up with your healthcare provider, or as directed.Keep all appointments. Also, prepare for any upcoming tests as directed.  When to seek medical advice  Call your healthcare provider right away if any of these occur:   Fever of 100.4F (38C) or higher   Redness or swelling of the breast   Discharge from the nipple   Visible changes in the skin over the nipple or breast   Lump grows larger, feels very hard, or has an irregular shape   New lumps form  Date Last Reviewed: 06/18/2015   2000-2019 [employer] [address],  [place] [address]. All rights reserved. This information is not intended as a substitute for professional medical care. Always follow your healthcare professional's instructions.

## 2017-10-24 NOTE — Progress Notes (Signed)
Dispo: Meeting with patient to discuss transition /disposition plan with patient. Patient provided with info to self pay options to include [facility].Patient referred to [facility] on 10/24/17.  Pt has Breast clinic referral in hand. Provided local specialists in the area.  Pt has Mammogram Digital Diagnostic Bilateral orders in hand .Provided info for local Imaging Centers for patient to scheduled an appointment.Pt to Higher education careers adviser if any assistance required.  GoodRx discount card given on 10/24/17.  Review instructions from provider with patient. All questions answered.Patient verb und and agreed with plan of care.  Patient to f/u in 2 weeks.

## 2017-10-27 ENCOUNTER — Ambulatory Visit
Admission: RE | Admit: 2017-10-27 | Discharge: 2017-10-27 | Disposition: A | Discharge status: Home / Self Care | Payer: Medicaid Other | Source: Ambulatory Visit | Attending: Nurse Practitioner | Admitting: Nurse Practitioner

## 2017-10-27 ENCOUNTER — Other Ambulatory Visit (INDEPENDENT_AMBULATORY_CARE_PROVIDER_SITE_OTHER): Payer: Self-pay | Admitting: Nurse Practitioner

## 2017-10-27 DIAGNOSIS — N63 Unspecified lump in unspecified breast: Secondary | ICD-10-CM

## 2017-10-27 HISTORY — DX: Unspecified lump in unspecified breast: N63.0

## 2017-10-28 ENCOUNTER — Other Ambulatory Visit (INDEPENDENT_AMBULATORY_CARE_PROVIDER_SITE_OTHER): Payer: Self-pay | Admitting: Nurse Practitioner

## 2017-10-28 DIAGNOSIS — N6310 Unspecified lump in the right breast, unspecified quadrant: Secondary | ICD-10-CM

## 2017-11-07 ENCOUNTER — Ambulatory Visit (INDEPENDENT_AMBULATORY_CARE_PROVIDER_SITE_OTHER): Payer: Medicaid Other | Admitting: Nurse Practitioner

## 2017-11-07 VITALS — BP 98/62 | HR 68 | Temp 98.4°F | Resp 14 | Wt 155.6 lb

## 2017-11-07 DIAGNOSIS — N6320 Unspecified lump in the left breast, unspecified quadrant: Secondary | ICD-10-CM

## 2017-11-08 NOTE — Progress Notes (Signed)
Chief Complaint: Breast Lump    History of Present Illness:     This patient is a 37 y.o. female presented with right breast lump noticed two days prior to her ED Visit. Patient has family history of breast cancer with her mother and her father side as well. Her mother is a breast cancer survivor who was diagnosed with breast cancer last year at the age of 59.   On 10/27/17 patient had breast ultrasound and diagnostic mammograph which indicated indeterminate cystic mass on left breast which needed FNA and possible needle core biopsy.     Past Medical History:   Diagnosis Date   . Anemia     no meds   . Breast lump 10/27/2017       Past Surgical History:   Procedure Laterality Date   . D & E, SUCTION N/A 09/10/2016    Procedure: D & E, SUCTION;  Surgeon: Erenest Blank, MD;  Location:  [facility];  Service: Gynecology;  Laterality: N/A;   . INDUCED ABORTION      x2   . LEEP LLETZ  05/21/2013    Procedure: LEEP LLETZ;  Surgeon: Jeanie Cooks, MD;  Location: [facility];  Service: Gynecology;  Laterality: N/A;   . VAGINAL DELIVERY  2000       Family History   Problem Relation Age of Onset   . Breast cancer Mother    . No known problems Father    . Breast cancer Cousin        Social History     Social History   . Marital status: Single     Spouse name: N/A   . Number of children: N/A   . Years of education: N/A     Occupational History   . Not on file.     Social History Main Topics   . Smoking status: Former Smoker     Years: 3.00   . Smokeless tobacco: Former Neurosurgeon      Comment: social    . Alcohol use Yes   . Drug use: No   . Sexual activity: Not Currently     Partners: Male     Other Topics Concern   . Not on file     Social History Narrative   . No narrative on file       Allergies   Allergen Reactions   . Penicillins Other (See Comments)     Was told by parent she was allergic as child         Current Outpatient Prescriptions:   .  ibuprofen (ADVIL,MOTRIN) 600 MG tablet, Take 1 tablet (600 mg total) by  mouth every 6 (six) hours as needed for Pain., Disp: 30 tablet, Rfl: 0  .  naproxen (NAPROSYN) 500 MG tablet, Take 1 tablet (500 mg total) by mouth 2 (two) times daily with meals., Disp: 30 tablet, Rfl: 0  .  Prenatal Multivit-Min-Fe-FA (PRENATAL 1 + IRON PO), Take 1 tablet by mouth daily., Disp: , Rfl:     Review of Systems   Constitutional: Negative.    Skin:        Right breast lump no nipple inversion or drainage       All systems reviewed and negative except as described above.    Physical Exam:     Vitals:    11/07/17 1533   BP: 98/62   Pulse: 68   Resp: 14   Temp: 98.4 F (36.9 C)   SpO2: 99%  Wt Readings from Last 3 Encounters:   11/07/17 70.6 kg (155 lb 9.6 oz)   10/24/17 70.3 kg (155 lb)   10/19/17 75 kg (165 lb 5.5 oz)       General: awake, alert, oriented x 3  HEENT: perrla, eomi, sclera anicteric,oropharynx clear without lesions, mucous membranes moist  Neck: supple, no lymphadenopathy, no thyromegaly, no JVD, no carotid bruits  Cardiovascular: S1, S2, regular rate and rhythm, no murmurs, rubs, or gallops  Lungs: clear to auscultation bilaterally, without wheezing, rhonchi, or rales  Abdomen: soft, non tender, normal bowel sounds  Extremities: no clubbing, cyanosis, or edema  Skin: no rashes or lesions noted  Breast: Left breast with no tenderness or mass. Right breast with palpable firm, well circumscribed mass in the upper outer quadrant at 10:00 pm position, no erythema or induration, no nipple inversion or drainage and no axillary LAD bilaterally.      Diagnostics:     Lab Results   Component Value Date    WBC 8.89 09/08/2016    HGB 11.8 (L) 09/08/2016    HCT 34.0 (L) 09/08/2016    PLT 296 09/08/2016    ALT 12 09/08/2016    AST 10 09/08/2016    NA 138 09/08/2016    K 3.4 (L) 09/08/2016    CL 108 09/08/2016    CREAT 0.6 09/08/2016    BUN 6.0 (L) 09/08/2016    CO2 22 09/08/2016    TSH 1.250 12/17/2015    GLU 87 09/08/2016    HGBA1C 5.5 12/17/2015       No results found.    Active Problems:      Patient Active Problem List   Diagnosis   . High grade squamous intraepithelial cervical dysplasia   . History of preterm delivery   . HSV-2 seropositive   . Cystic hygroma   . Fetal demise due to miscarriage   . Breast lump       Assessment/Plan:     37 y.o. female with right breast lump    Diagnoses and all orders for this visit:    Breast lump in upper outer quadrant  -     Ambulatory referral to Breast Clinic  -     Mammo Digital Diagnostic Bilateral W Cad; Future   Ordered US Guided Fine Needle Aspiration scheduled on 7/29th and initial appointment with Breast Surgeon Dr. Alberteen Sam on 7/25th.     Patient was notified about all her appointment.     Follow up: Yes  Discharged from [facility]?: No  Medical Home Referred to/Referred Back to: [facility]     Med Rec Complete: Yes  [facility] for diabetes A1c > 8.5: N/A  Given commitment to care documents when appropriate: N/A   Teach back for HF, COPD, DM, AMI, PNA: N/A

## 2017-11-10 ENCOUNTER — Ambulatory Visit: Payer: Charity | Attending: Surgery | Admitting: Surgery

## 2017-11-10 ENCOUNTER — Encounter: Payer: Self-pay | Admitting: Surgery

## 2017-11-10 VITALS — BP 110/69 | HR 75 | Temp 98.1°F | Ht 67.0 in | Wt 153.9 lb

## 2017-11-10 DIAGNOSIS — Z803 Family history of malignant neoplasm of breast: Secondary | ICD-10-CM

## 2017-11-10 DIAGNOSIS — R928 Other abnormal and inconclusive findings on diagnostic imaging of breast: Secondary | ICD-10-CM

## 2017-11-10 NOTE — Progress Notes (Signed)
Subjective:       Patient ID: Judy Aguirre is a 37 y.o. female.    HPI   Ms. Judy Aguirre is a 37 year old female who presents related to recent abnormal imaging.  She reports that approximately 2 weeks ago, she felt a lump in the upper outer right breast.  She was very concerned, because her mother had breast cancer.  She was sent for imaging studies, occurring on October 27, 2017.  A bilateral mammogram shows a subtle nodular asymmetry in the left posterior superolateral breast that has altered appearance on compression views.  There is an occasional benign calcification.  A subsequent targeted bilateral breast ultrasound demonstrates a cyst at 10:00 on the right, measuring 1.7 cm, 8 to 9 cm from the nipple.  In the area of mammographic nodularity, in the left 1:00 breast, there is an irregular cystic focus, 7 to 8 cm from the nipple, measuring 5 mm.  This may represent a complicated cyst and is recommended for FNA.  This has been scheduled to occur at [facility] next week.  She was sent to me for further evaluation.    Currently, the lump she felt is less prominent.  She has been tender since the mammogram.  She has not noticed any other breast masses, breast pains, skin changes or retraction, nor any nipple abnormalities or discharge.  She reports that she was able to elicit discharge with squeezing for several years after she breast fed.  She has not previously required breast biopsy or breast surgery.    FAMILY HISTORY: Her mother was diagnosed breast cancer at age 62.  She also reports 2 half paternal aunts (same father) with breast cancers in their 69s.  Additionally, a daughter to 1 of these aunts also had breast cancer in her 33s.  There is no family history of ovarian cancer.  Her family includes 2 paternal aunts who are half sisters to her father.    The following portions of the patient's history were reviewed and updated as appropriate: allergies, current medications, past family history,  past medical history, past social history, past surgical history and problem list.    Review of Systems   Constitutional: Positive for fatigue. Negative for appetite change, chills and fever.   HENT: Negative for congestion, ear pain, hearing loss, nosebleeds, sinus pain, sore throat, tinnitus, trouble swallowing and voice change.    Eyes: Negative for pain, discharge and redness.   Respiratory: Negative for wheezing.    Cardiovascular: Negative for chest pain, palpitations and leg swelling.   Gastrointestinal: Negative for abdominal pain, blood in stool, constipation, diarrhea, nausea and vomiting.   Endocrine: Negative for cold intolerance and heat intolerance.   Genitourinary: Negative for difficulty urinating, dysuria, flank pain, hematuria, pelvic pain and vaginal discharge.   Musculoskeletal: Positive for back pain and neck stiffness. Negative for arthralgias, myalgias and neck pain.   Skin: Positive for rash. Negative for wound.   Neurological: Positive for numbness. Negative for dizziness, tremors, seizures, speech difficulty, weakness and headaches.   Hematological: Negative for adenopathy.   Psychiatric/Behavioral: Negative for agitation and confusion. The patient is not nervous/anxious.            Objective:    Physical Exam   Constitutional: She is oriented to person, place, and time. She appears well-developed and well-nourished.   HENT:   Head: Normocephalic and atraumatic.   Eyes: No scleral icterus.   Neck: Normal range of motion. Neck supple.   Cardiovascular: Normal rate and  regular rhythm.    Pulmonary/Chest: Breath sounds normal. Right breast exhibits no inverted nipple, no mass, no nipple discharge, no skin change and no tenderness. Left breast exhibits no inverted nipple, no mass, no nipple discharge, no skin change and no tenderness. Breasts are symmetrical.       Generally lumpy tissue in the upper outer right breast, without any specific palpable mass.   Abdominal: Soft. She exhibits no  distension. There is no tenderness.   Musculoskeletal:        Right shoulder: Normal.        Left shoulder: Normal.   Lymphadenopathy:     She has no cervical adenopathy.     She has no axillary adenopathy.        Right: No supraclavicular adenopathy present.        Left: No supraclavicular adenopathy present.   Neurological: She is alert and oriented to person, place, and time.   Skin: Skin is warm and dry. No lesion noted.       REVIEW OF IMAGING: Verne Carrow)  October 27, 2017: A bilateral mammogram is performed secondary to a right breast lump.  This shows heterogeneously dense and nodular tissue.  There is subtle nodular asymmetry in the left posterior superolateral breast that has altered appearance on compression views.  There is an occasional benign calcification.  A subsequent targeted bilateral breast ultrasound demonstrates a cyst at 10:00 on the right, measuring 1.7 cm, 8 to 9 cm from the nipple.  In the area of mammographic nodularity, in the left 1:00 breast, there is an irregular cystic focus, 7 to 8 cm from the nipple, measuring 5 mm.  This may represent a complicated cyst and is recommended for FNA.  BI-RADS 3.        Assessment:       A 37 year old female with a complex left breast cyst, recommended for FNA.      Plan:       In consultation with the patient, reviewed the findings of her physical exam and reports from radiology.  There is some lumpiness of the tissue in the upper outer right breast, but I was not able to appreciate a specific palpable mass.  She understands there was a complex cystic lesion seen on ultrasound of the left breast.  She will proceed with the FNA next week.    We reviewed her family history.  Due to the significant family history of breast cancer on both sides of her family, I recommended meeting with a genetic counselor to discuss possible genetic testing.    I used the IBIS risk assessment model, to help estimate her lifetime risk of breast cancer.  This model predicts a  30.7% lifetime risk.  This is significantly elevated over the general population and would qualify for additional screening with breast MRI.  At this point, we will await the findings of the FNA and genetic testing.  She will need follow-up imaging in 6 months.  She will return to see me at that time and we may further discuss the use of MRI.    Procedures  none

## 2017-11-14 ENCOUNTER — Other Ambulatory Visit (INDEPENDENT_AMBULATORY_CARE_PROVIDER_SITE_OTHER): Payer: Self-pay | Admitting: Surgery

## 2017-11-14 ENCOUNTER — Ambulatory Visit
Admission: RE | Admit: 2017-11-14 | Discharge: 2017-11-14 | Disposition: A | Discharge status: Home / Self Care | Payer: Medicaid Other | Source: Ambulatory Visit | Attending: Nurse Practitioner | Admitting: Nurse Practitioner

## 2017-11-14 ENCOUNTER — Other Ambulatory Visit (INDEPENDENT_AMBULATORY_CARE_PROVIDER_SITE_OTHER): Payer: Self-pay | Admitting: Nurse Practitioner

## 2017-11-14 ENCOUNTER — Encounter: Payer: Self-pay | Admitting: Surgery

## 2017-11-14 ENCOUNTER — Ambulatory Visit: Payer: Self-pay

## 2017-11-14 DIAGNOSIS — R928 Other abnormal and inconclusive findings on diagnostic imaging of breast: Secondary | ICD-10-CM | POA: Insufficient documentation

## 2017-11-14 DIAGNOSIS — N6310 Unspecified lump in the right breast, unspecified quadrant: Secondary | ICD-10-CM

## 2017-11-14 DIAGNOSIS — N6002 Solitary cyst of left breast: Secondary | ICD-10-CM

## 2017-11-14 DIAGNOSIS — N6032 Fibrosclerosis of left breast: Secondary | ICD-10-CM | POA: Insufficient documentation

## 2017-11-14 DIAGNOSIS — N6320 Unspecified lump in the left breast, unspecified quadrant: Secondary | ICD-10-CM

## 2017-11-14 DIAGNOSIS — Z803 Family history of malignant neoplasm of breast: Secondary | ICD-10-CM | POA: Insufficient documentation

## 2017-11-14 DIAGNOSIS — N62 Hypertrophy of breast: Secondary | ICD-10-CM

## 2017-11-14 MED ORDER — LIDOCAINE HCL 1 % IJ SOLN
10.00 mL | Freq: Once | INTRAMUSCULAR | Status: AC
Start: 2017-11-14 — End: 2017-11-14
  Administered 2017-11-14: 12:00:00 10 mL

## 2017-11-15 LAB — LAB USE ONLY - HISTORICAL SURGICAL PATHOLOGY

## 2017-11-18 ENCOUNTER — Telehealth (INDEPENDENT_AMBULATORY_CARE_PROVIDER_SITE_OTHER): Payer: Self-pay

## 2017-11-18 ENCOUNTER — Encounter (INDEPENDENT_AMBULATORY_CARE_PROVIDER_SITE_OTHER): Payer: Self-pay

## 2017-11-18 ENCOUNTER — Other Ambulatory Visit (INDEPENDENT_AMBULATORY_CARE_PROVIDER_SITE_OTHER): Payer: Self-pay | Admitting: Surgery

## 2017-11-18 DIAGNOSIS — R928 Other abnormal and inconclusive findings on diagnostic imaging of breast: Secondary | ICD-10-CM

## 2017-11-18 DIAGNOSIS — Z9889 Other specified postprocedural states: Secondary | ICD-10-CM

## 2017-11-18 NOTE — Telephone Encounter (Signed)
Returned patient's call. 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 Offered patient an appointment to discuss her issues with Dr. Smitty Cords or if patient prefers Dr. Smitty Cords could call her and discuss the issue on the phone. Patient preferred a phone call.

## 2017-11-18 NOTE — Telephone Encounter (Signed)
LM for patient about the results of her left breast biopsy done on 11/14/29. Results below. 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  You are scheduled for follow up with Dr. Smitty Cords on 05/18/2018. Originially you needed a bilateral ultrasound for imaging. Now you will a left breast mammogram as well. The order has been faxed to [facility]. Please call the office with any questions. Call back number provided.       Addendum to ultrasound guided core biopsy of left 1:00 breast mass:    The final pathology results have been received. 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 There is noevidence of atypia or malignancy. These results are concordant with the  imaging features. The results were forwarded to the referring physicians.    Recommendations: Left mammogram and ultrasound in 6 months.

## 2017-11-18 NOTE — Progress Notes (Signed)
Judy Aguirre is a 37 y.o. female referred by Dr. Smitty Cords for genetic counseling regarding her family history of breast cancer. After discussion of the risks, benefits, and limitations of genetic testing, Judy Aguirre elected to pursue the 75-gene CustomNext-Cancer panel from [employer] [employer] I will call her in 2-3 weeks from the date that her genetic testing is initiated with the results of her testing.       Reason for Referral  Judy Aguirre is a 37 y.o. female of [place] ancestry who is referred by Dr. Smitty Cords for evaluation of personal history of stromal hyperplasia of the breast in the context of a family history breast cancer, for a discussion regarding genetic testing, and for recommendations for ongoing cancer surveillance and prevention.  The patient was unaccompanied and was seen for consultation lasting approximately 60 minutes on 11/21/2017.      Medical History   Judy Aguirre reports no personal history of cancer. She underwent a left breast biopsy in 10/2017 which noted "stromal fibrosis and pseudoangiomatous stromal hyperplasia" as part of investigation of a self-palpated breast lump. She reports that this was her first breast biopsy. She is presently deciding whether she will opt for increased breast screening versus lumpectomy as management of this concern; she reports that the results of genetic testing may help her in this decision-making.    Judy Aguirre' relevant gynecological history is as follows: Menarche was at 37yo and she is pre-menopausal. She is G5P1 (at least 2 TABs, per medical record review, implying a maximum of 2 SABs) and was 37 years old when her first child was born. She reports brief (<1 year) use of oral contraceptives and denies use of hormone replacement therapy. She reports her current height as 5'7" and current weight as 155lbs. Her ovaries and uterus are intact. 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     Collen Hostler reports performing the following cancer screening:    Ovaries: She denies a history of transvaginal ultrasound imaging of the uterus and ovaries.   Colon: She denies a history of colonoscopy.      Family History  Judy Aguirre' family history is significant for the following:    Problem Relation Age of Onset   . Breast cancer Mother 23   . Breast cancer Paternal Half-Aunt 76   . Breast cancer Paternal Half-Cousin 42        Pt reports genetic testing was negative, report not reviewed     Please see pedigree for additional details of the family structure.      Assessment   Judy Aguirre' family history warrants consideration of an inherited susceptibility to cancer.  Suggestive features include: (a) her multiple paternal relatives of multiple generations diagnosed with breast cancer, including (b) early onset diagnoses of breast cancer, as well as (c) her maternal family history of breast cancer in the context of limited family structure and family history information.    Judy Aguirre' family histories of breast cancer cause [place] to consider the possibility for her and/or her affected relatives to carry a mutation in a breast cancer susceptibility gene. We discussed that there are different types of breast cancer susceptibility genes, including high risk (i.e. BRCA1 and BRCA2, PALB2), moderate risk (ATM, CHEK2) and newly described. Families that have a mutation in a gene in the latter two categories  are more difficult to predict at the present time, given that mutations in these genes present with a variable clinical picture in affected families. We reviewed that while breast cancer is relatively common in women, diagnoses made younger than 37yo are considered "early onset" and are more strongly suggestive of inherited predisposition than  are diagnoses made at later ages. Therefore, Judy Aguirre' family history of early onset breast cancer in her paternal half-cousin is somewhat suggestive of inherited predisposition to breast cancer, particularly considering that this relative's mother Judy Aguirre' paternal half-aunt) was also diagnosed with a breast cancer, though at an age more strongly suggestive of "sporadic" (or non-inherited) cause. It should be noted that Judy Aguirre reports that her paternal half-cousin with a history of early onset breast cancer pursued genetic testing which was negative (i.e. did not identify a cancer predisposition mutation); I encouraged Judy Aguirre to share with me a copy of this relative's genetic test results for my review, should she have ability to get a copy from her relative. This will allow me to review what genes and technologies were included in this relative's genetic testing, as well as other factors which may influence my risk assessment for Judy Aguirre and her other paternal relatives.     Further, Judy Aguirre reports a history of breast cancer in her mother. Though Judy Aguirre' mother was diagnosed at an age more strongly suggestive of sporadic disease, it is significant to note that there is a very limited family structure on Judy Aguirre' maternal side of the family and there is little to no information available about the structure and medical histories of relatives on this side. Therefore, my ability to assess Judy Aguirre' maternal family history for inherited predisposition to breast cancer is somewhat limited.     We specifically reviewed that while there is no data available at this time which links Judy Aguirre' personal history of stromal hyperplasia of breast tissue with any known cancer predisposition genes, she may find this information useful as she decides how she  would like to manage this diagnosis. Specifically, Judy Aguirre reports that if she were identified to have inherited predisposition for breast cancer, she may consider a more aggressive treatment for her recent diagnosis, rather than the more conservative plan she is presently leaning towards.     Alternatively, Orena Cavazos' personal and family history may indicate the presence of a familial predisposition that is not due to a known high-penetrance susceptibility gene.       Content of Discussion  We discussed the difference between sporadic versus hereditary cancers and the relative frequencies of these.      We reviewed the availability of multi-gene panels and the varying levels of cancer risk associated with different genes. Broadly, we consider some genes to be "high risk" while others cause a more "moderate" level of risk.  Lastly, there are some "newly described" genes for which the level of cancer risk is not yet well understood.    We reviewed possible genetic test results, including positive, negative, and inconclusive. Much of our discussion focused on the medical implications of a "positive" genetic test result, including risk(s) for possibly more than one type of cancer and the options for management of increased cancer risks including elevated surveillance, risk-reducing surgery, and chemoprevention.  I explained that effective screening and/or risk-reducing options may not exist for all of the increased cancer risks associated with the hereditary cancer susceptibility  genes.     Joanne Salah understood the limitations in some of the currently available data, that recommendations for cancer screening and/or risk reduction are tailored to both the genetic test result and the personal/family history, and that her age and other factors need to be taken into account when trying to determine the degree of benefit that she may derive from risk-reducing surgery.       We  discussed the fact that "negative" test results are of limited value unless a mutation has already been identified in the family. There is a significant population risk for cancer, and there may be unidentified genes responsible for the observed personal and family history.  Increased cancer surveillance (or prevention interventions) may be appropriate even after negative results, based upon empiric estimates of risk. There is a chance that a mutation in a particular cancer susceptibility gene may not be detected, even if one is present, due to either technical limitations or laboratory error.     I also explained that the test result may identify a variant of uncertain significance.  These "variants" may or may not be associated with an increased cancer risk, and the optimal management of families transmitting such variants has not been defined.  The majority of variants of uncertain significance are ultimately reclassified as benign (not associated with an increased cancer risk).  Screening and prevention recommendations are therefore based on someone's personal and family histories.    We specifically discussed the 75-gene CustomNext-Cancer panel from [facility].  The full list of genes included on this panel is: AIP, ALK, APC, ATM, AXIN2, BAP1, BARD1, BLM, BRCA1, BRCA2, BRIP1, BMPR1A, CDC73, CDH1, CDK4, CDKN1B, CDKN2A, CHEK2, CTNNA1, DICER1, EGFR, EPCAM, FANCC, FH, FLCN, GALNT12, GREM1, HOXB13,  KIT, MAX, MEN1, MET, MITF, MLH1, MRE11A, MSH2, MSH3, MSH6, MUTYH, NBN, NF1, NF2, NTHL1, PALB2, PDGFRA, PHOX2B, PMS2, POLD1, POLE, POT1, PRKAR1A, PTCH1, PTEN, RAD50, RAD51C, RAD51D, RB1, RET, SDHA, SDHAF2, SDHB, SDHC, SDHD, SMAD4, SMARCA4, SMARCB1, SMARCE1, STK11, SUFU, TMEM127, TP53, TSC1, TSC2, VHL, and XRCC2.  With a panel there is an approximately 25% likelihood of finding a variant of unknown significance in one or more of the genes included.      We discussed the fact that genetic testing would be recommended  for her immediate relatives (her son, parents, and half-sister) if her test result is positive.  In this case, her son and parents would each have a 50% likelihood of sharing the mutation, and her half-sister would have a 25% chance of sharing the mutation. If Sharalyn Lomba' test result is negative or inconclusive, I may recommend that her paternal half-cousin consider pursuing her own genetic testing or, if this relative has already pursued testing, to share this result report with Judy Aguirre for my review. When considering genetic testing for more distant relatives, it is important to note that (1) in most cases, genetic testing for cancer susceptibility is not recommended for individuals under age 64 years unless the result will alter their medical management before this age and (2) family members who choose not to be tested may be able to infer their mutation status from that of others tested.    I also discussed our policies for documentation of genetic test results in the medical record and the theoretical possibility of employment and insurance discrimination based on genetic test results.      Psychosocial Considerations  Genetic testing may have significant psychological implications for both individuals and families.  During the session, Pia Mau  Darden demonstrated understanding of the material presented. She raised appropriate questions, which were answered to the best of my ability.       Plan for Genetic Testing  Pinkie Manger is aware that genetic testing may indicate an increased risk for cancers unrelated to her personal and family history. Evadean Sproule indicated intention to follow medical management guidelines provided on the basis of genetic testing and/or her personal and family history, as appropriate. She reports intention to share the results of genetic testing and relevant management guidelines to her relatives as recommended and  possible.    After this discussion, Windi Toro demonstrated appropriate understanding of the material covered and elected to pursue genetic testing. She provided a saliva sample for testing, which was sent to [facility]. The specific test ordered is CustomNext-Cancer (75 genes).  Expected turn-around-time for the test is 2 to 3 weeks from the time that testing is initiated.     The insurance and billing process was reviewed.       Screening Recommendations and Plan for Follow-Up  I will discuss screening and prevention recommendations when genetic test results are available to inform that discussion.       Conclusion   It was a pleasure to meet with Judy Aguirre and I remain available for any additional questions that arise while we wait for genetic test results. I will contact Judy Aguirre by phone when her results are available.  As has been discussed with the patient, if she is unavailable and can't be reached on multiple occasions, I will disclose the result directly to the referring physician.      Bevelyn Buckles, MS, [facility]  Licensed and Certified Teacher, early years/pre Cancer Institute  A Division of [facility]  Tel: [phone]  Email: [email or web address]

## 2017-11-21 ENCOUNTER — Encounter (INDEPENDENT_AMBULATORY_CARE_PROVIDER_SITE_OTHER): Payer: Self-pay

## 2017-11-21 ENCOUNTER — Ambulatory Visit (INDEPENDENT_AMBULATORY_CARE_PROVIDER_SITE_OTHER): Payer: Medicaid Other

## 2017-11-21 DIAGNOSIS — Z803 Family history of malignant neoplasm of breast: Secondary | ICD-10-CM

## 2017-11-28 ENCOUNTER — Ambulatory Visit (INDEPENDENT_AMBULATORY_CARE_PROVIDER_SITE_OTHER): Payer: Self-pay | Admitting: Nurse Practitioner

## 2018-02-08 ENCOUNTER — Encounter (INDEPENDENT_AMBULATORY_CARE_PROVIDER_SITE_OTHER): Payer: Self-pay

## 2018-02-08 NOTE — Progress Notes (Signed)
Genetic Testing Result Disclosure     Patient Name: Judy Aguirre  Date: 02/08/2018  Length of Conversation: 5 minutes     I called [name] on 02/08/2018 to discuss the results of genetic testing performed to investigate her family history of cancer. [name] elected to pursue the 75-gene CustomNext-Cancer panel from [facility].  [name]'s results were NEGATIVE and therefore DO NOT indicate an inherited predisposition for cancer. We discussed that [name] and her relatives are recommended to be managed based on the reported personal and/or family history and to share this history with their doctors.     Follow-up will include:  1. I will review Danilynn Jemison results at case conference to finalize screening recommendations  2. I will write a detailed summary note that includes interpretation of the test result, screening recommendations for [name] and/or her relatives, etc.  3. Anwar Crill will be sent a copy of her genetic test results, my initial consult note, and my final note in the mail.  4. I will inform Jeralyn Bennett referring provider, Dr. Smitty Cords, of these results upon completion of the summary letter.     Bevelyn Buckles, MS, [facility]  Licensed and Certified Teacher, early years/pre Cancer Institute  A Division of [facility]  Tel: [phone]  Email: [email or web address]

## 2018-03-26 ENCOUNTER — Encounter (INDEPENDENT_AMBULATORY_CARE_PROVIDER_SITE_OTHER): Payer: Self-pay

## 2018-03-26 NOTE — Progress Notes (Signed)
SUMMARY:   Judy Aguirre is a 37 y.o. female referred by Dr. Smitty Cords for genetic counseling regarding her family history of breast cancer. After an initial genetic counseling consult, Judy Aguirre elected to pursue the 75-gene CustomNext-Cancer panel from [facility]. The results of Judy Aguirre' genetic testing were NEGATIVE and therefore DO NOT indicate a predisposition to cancer. Discussion of the result, medical management recommendations, and familial testing recommendations are included.      HISTORY:  Judy Aguirre reports no personal history of cancer. 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     Judy Aguirre' family history of cancer was previously reported as follows. Please see pedigree for additional details of the family structure:     Problem Relation Age of Onset   . Breast cancer Mother 61   . Breast cancer Paternal Half-Aunt 43   . Breast cancer Paternal Half-Cousin 42         Pt reports genetic testing was negative, report not reviewed      I contacted Judy Aguirre to review the results of this test by phone as previously arranged.  We spoke on 02/08/2018.      TEST RESULTS:  The results were negative, meaning that no mutations were identified in any of the genes analyzed.  Further, no variants of unknown significance were identified.     Judy Aguirre' genetic testing included a multi-gene panel known as CustomNext Cancer (sequencing and deletion/duplication analysis of AIP, ALK, APC, ATM, AXIN2, BAP1, BARD1, BLM, BMPR1A, BRCA1, BRCA2, BRIP1, CDC73, CDH1, CDK4,CDKN1B,CDKN2A,CHEK2, CTNNA1, DICER1, EPCAM, FANCC, FH, FLCN, GALNT12, GREM1, HOXB13,  KIT, MAX, MEN1, MET, MITF, MLH1, MRE11A, MSH2, MSH3, MSH6, MUTYH, NBN, NF1, NF2, NTHL1, PALB2, PDGFRA, PHOX2B, PMS2, POLD1, POLE, POT1,  PRKAR1A, PTCH1, PTEN, RAD50, RAD51C, RAD51D, RB1, RET, SDHA, SDHAF2, SDHB, SDHC, SDHD, SMAD4, SMARCA4, SMARCB1, SMARCE1, STK11, SUFU, TMEM127, TP53, TSC1, TSC2, VHL, and XRCC2; sequencing only of EGFR and MITF, deletion/duplication analysis of EPCAM and GREM1) performed by [facility] [facility] [employer] DNA sequencing can identify mutations that are typically small in size and deletion / duplication testing can identify larger, rarer mutations.  This combination of techniques is capable of detecting hundreds of possible mutations within these genes.      RESULT INTERPRETATION:  This negative genetic test result significantly reduces the likelihood that Judy Aguirre has inherited an increased risk for developing cancer. However, I explained to Judy Aguirre that this negative result unfortunately does not completely rule out this possibility:    1. There could be a mutation in one of the genes that the test did not detect, although the likelihood of this is low;  2. There could be a mutation in a gene that was not analyzed, either known or unknown;  3. It is possible that there is a hereditary risk present in other relatives that Judy Aguirre did not inherit.  Genetic testing is recommended for relatives at this time - please see below for more information;  4. Lastly, Judy Aguirre' family history of cancer may have developed due to a combination of genetic and other environmental factors that cannot be specifically defined at this time.      However, because Judy Aguirre' family history is still somewhat suggestive of an inherited susceptibility to cancer, I remain moderately concerned despite her negative genetic test result. Judy Aguirre  will remain a reasonable candidate for genetic testing in the future as additional genes relevant to inherited cancer predisposition may be identified and genetic testing is recommended for her relatives.    While the  likelihood is extremely small, there is always the possibility of human or laboratory error in performing a genetic test. The laboratories we utilize take every effort to maximize the accuracy of the tests performed. Since genetic test results have important implications for medical management, patients may wish to consider having the test repeated by submitting a fresh sample. Repeat testing may be particularly useful for unaffected individuals who are considering risk-reducing surgery based on positive results or for individuals who test negative for a known mutation in the family.     Some individuals have different genetic variants within or between tissues (i.e., blood/saliva versus skin). The test may not detect genetic variants that are only present in some cells or tissues.      CANCER RISK(S) FOR Judy Aguirre:   Judy Aguirre is aware that, even though a mutation was not identified on her genetic test, she may still be at increased risk for developing cancer(s) for the reasons noted above.    I discussed with Judy Aguirre how we estimate risk for ovarian cancer following a negative genetic test result. Judy Aguirre' negative genetic test results and a reported lack of a family history of ovarian cancer indicate that her lifetime risk for ovarian cancer is expected to be the same as the general population risk of about 1.5%-2%.  This is based on studies that have followed women with personal and/or family histories of breast cancer, no family history of ovarian cancer and negative BRCA1 and BRCA2 genetic test results.    The general population lifetime risk for colon cancer is ~ 5.5%.       CANCER SCREENING AND/OR PREVENTION RECOMMENDATIONS for Judy Aguirre  At this time we recommend that cancer screening and prevention be based on Judy Aguirre' family history of cancer.      The following recommendations were reviewed with colleagues at  our cancer genetics case conference. Judy Aguirre should review these recommendations in detail with her physicians to finalize a cancer screening plan - including the risks, benefits and limitations of each screening method.  All recommendations should be considered with regard to her personal history and current health. Recommendations may change as personal and/or family histories of cancer change, as additional genetic test results become available for Verania Salberg or relatives, or as medical science advances. It is very important to note that surveillance (or cancer screening) - no matter how well or carefully performed - does not guarantee that cancer will be detected at a stage that is curable or that will require minimal treatment.     BREAST: Per the NCCN guidelines (V2.2018), for women who have a lifetime risk >20% as defined by models that are largely dependent on family history (including the IBIS / Tyrer-Cuzick model) and personal risk factors (such as abnormal breast biopsy results), breast cancer surveillance should include:    Clinical encounter to include breast exam, ongoing risk assessment and risk reduction counseling (discussion of surgical and/or chemopreventative options) every 6-12 months to begin at the age identified as being at increased risk;   Annual screening mammogram to begin 10 years prior to youngest family member but not prior to age 21y; consider tomosynthesis   Recommend annual breast MRI  to begin 10 years prior  to youngest family member but not prior to age 56y;   Breast awareness (monthly self-breast examination is encouraged);    Judy Aguirre should continue to follow up with Dr. Smitty Cords and is encouraged to review these guidelines and have a discussion about increased breast cancer screening based on her family history of breast cancer and personal history of breast atypia.    OVARIES: The screening for ovarian cancer consists of CA-125  blood tests and transvaginal ultrasounds (TVUs) with color Doppler, but this has not been proven to detect the cancer at an early stage. For women who are expected to have a risk of ovarian cancer that is at or near to the general population risk, ovarian cancer screening is generally not recommended.   I encouraged Judy Aguirre to have a careful discussion with her physician(s) that includes the benefits and limitations of screening to determine the best options for her.  She should also make sure to undergo routine gynecological screening, including pelvic exams and/or PAP smears once per year or as recommended by her physician(s).    COLON:  Judy Aguirre reports no prior colonoscopy screening or family history of colon cancer. Previously, colon cancer screening via colonoscopy was typically recommended to start at the age of 79. However, the American Cancer Society has recently recommended that individuals at average risk of colon cancer begin regular screening at the age of 15. This can be done with a sensitive test that looks for signs of cancer in a person's stool or with an exam that looks at the colon and/or rectum (i.e., a sigmoidoscopy or colonoscopy).  We recommend that the patient review these screening recommendations with her physician to determine the most appropriate age to begin, screening method, and follow-up frequency.    SKIN:   Given the high frequency of skin cancer in the general population, it is important that Judy Aguirre perform skin exams and also have her doctor perform these exams on a regular basis.    LIFESTYLE MODIFIERS: In addition to family history, lifestyle factors may also influence cancer risk. These include diet, exercise and exposure to tobacco and alcohol. It is therefore important that Judy Aguirre maintain a well-balanced diet, try to exercise on a regular basis, limit alcohol consumption, and also avoid tobacco use.         ADDITIONAL GENETIC TESTING RECOMMENDATIONS FOR Judy Aguirre:  Judy Aguirre is not a clear candidate for additional genetic testing at this time given that she received an expanded multi-gene panel. However, she will remain a reasonable candidate for additional testing in the future as more cancer predisposition genes are identified.    Genetic testing is recommended for Judy Aguirre' relative(s) - please see below.      INFORMATION FOR Judy Eagen' RELATIVES:    Based on this test result, Judy Aguirre is not expected to have passed down a detectable mutation in any of the genes analyzed to her children.      Given remaining concern about possible hereditary risk as noted above, I recommend consideration of additional genetic testing for Alixis Harmon' relatives. The best candidate(s) for genetic testing in the family is Scharlene Catalina' paternal half-cousin with a personal history of early onset breast cancer. Other paternal relatives of Charlina Dwight could also consider genetic risk assessment. The rationale for this would be to evaluate the possibility of a definable gene mutation present in other relatives that Judy Aguirre  has not inherited.  Identification of a mutation in a relative would not only provide useful information for that particular relative (and others), but it may also allow [place] to reinterpret Jeremiah Tarpley' negative genetic test result with significantly more reassurance than is appropriate at this time.    Trisa Cranor reports that her paternal half-cousin with a personal history of early onset breast cancer has pursued genetic testing for hereditary cancer predisposition which was negative for identifiable mutations. If results from this relative's genetic testing are ever made available to Judy Aguirre, I encourage her to share these results with me  for review and to incorporate into my risk assessment for Jules Vidovich and her relatives.    All of Terrah Decoster' relatives should pursue cancer screening as recommended by their physicians based on their personal histories and - at a minimum - the general population recommendations.  I further recommend that they review the family history of cancer and Renarda Mullinix' genetic test results and have a discussion with their physicians about whether additional increased cancer screening and/or prevention is recommended. Relatives may benefit from individualized genetic counseling to discuss personalized screening recommendations.        FOLLOW UP:  Although Larena Ohnemus' test results were negative, we discussed the fact that it is normal to still have questions as to why her relatives may have developed cancer.  If she and/or her physicians have other questions about her genetic testing and the material that was discussed, we would be more than happy to have additional conversation(s).    1. Because information about cancer genetics is constantly changing and evolving, I encouraged Jalexa Pifer to contact me via phone or e-mail every 6-12 months to determine if there may be any new major updates or advances that are relevant for her. Ideally, she should also make [place] aware of any changes in her address or phone number so we can reach her in the future. Rayne Cowdrey may find it easiest to note a date significant to her genetic counseling experience - the date of the initial consult, result disclosure, or upon receiving this summation letter - and to contact me on that anniversary for updates.   2. Sanela Evola should let us know if there are any changes to her personal and/or family histories - including if anyone else in the family pursues genetic testing for hereditary cancer risk, no matter what the result - as these could influence the  current risk assessment.  As noted above, genetic testing is recommended for relative(s).  3. It is often important to try to review medical, pathology and/or death certificates from family members that have been diagnosed with cancer, as this information may have an impact on the risk assessment provided to [employer]. We would be interested in particular in reviewing records from Judy Aguirre' relatives' cancer diagnoses and/or genetic testing.  As discussed, we can provide Judy Aguirre with medical record request forms for her family members' records if she and/or her (or their next of kin) are interested in and willing to participate in this process.  4. As the field of cancer genetics is rapidly advancing, Dream Harman and/or another family member may wish to consider storing genetic material (DNA) for the possibility of future genetic testing.  We recommend contacting [facility], a DNA banking facility at [email or web address] or [phone] for more information.  We are available if Pia Mau  Nowakowski has any general questions about DNA banking.    A copy of the test results will be provided to Judy Aguirre and will also be scanned into her [facility] electronic medical record as discussed. Copies can be sent to non-[place] physician(s) as requested.      It was a pleasure to provide genetic counseling to [employer].  I look forward to staying in touch in the future and I remain available for any questions that arise.      Bevelyn Buckles, MS, [facility]  Licensed and Certified Teacher, early years/pre Cancer Institute  A Division of [facility]  Tel: [phone]  Email: [email or web address]

## 2018-03-26 NOTE — Progress Notes (Signed)
Dear Ms. [name],      It was a pleasure to meet with you for genetic counseling as part of the [employer] Program at [place]. As you know, I reviewed the results of your genetic test with you by phone on 02/08/2018. Since that conversation, I have reviewed your case with colleagues at our [facility] cancer genetics case conference to finalize recommendations to you and your relatives.      Please find enclosed copies of:    - Your test results from [facility] [facility]    - My summary note from our initial consultation that includes:  Relevant details of your reported personal medical history;   Relevant details of your reported family history    - My summary notes regarding the interpretation of your test result that also includes:  Cancer screening recommendations;  Other follow-up recommendations    - A copy of your family history    - Other relevant documents and materials, as applicable      Please review all of the enclosed documentation carefully and let me know if you have any questions. It was a pleasure to get to work with you and I encourage you to keep in touch with genetics into the future.        Sincerely,    Bevelyn Buckles, MS, [facility]  Licensed and Certified Teacher, early years/pre Cancer Institute  A Division of [facility]  Tel: [phone]  Email: [email or web address]

## 2018-04-26 ENCOUNTER — Telehealth (INDEPENDENT_AMBULATORY_CARE_PROVIDER_SITE_OTHER): Payer: Self-pay

## 2018-04-26 NOTE — Telephone Encounter (Signed)
Per email below called patient and explained that she is not due for a bilateral mammogram. Her last bilateral mammogram was in July 2019. Patient is due for left diagnostic mammogram with the exam reason stating "STFU left breast PASH/ bilateral US for cystic lesions". Patient verbalized understanding and stated she will call scheduling an make an appointment.      From: Ronda Fairly   Sent: Wednesday, April 26, 2018 11:02 AM  To: Randolm Idol.  Subject: order request    Judy Aguirre  37 y.o., 11-12-1980  MRN: [id number]    Hi Jenn,    This patient would like an order be put in either for a bilateral mammo or an order for a right breast mammo there is one for her left breast but the patient would prefer to get both breasts done. She can be reached if needed at [phone].    Thank you,  Gabriela A.

## 2018-05-18 ENCOUNTER — Ambulatory Visit: Payer: Self-pay | Admitting: Surgery

## 2018-05-29 ENCOUNTER — Ambulatory Visit: Payer: Self-pay

## 2018-06-02 ENCOUNTER — Other Ambulatory Visit (INDEPENDENT_AMBULATORY_CARE_PROVIDER_SITE_OTHER): Payer: Self-pay | Admitting: Surgery

## 2018-06-02 DIAGNOSIS — Z9889 Other specified postprocedural states: Secondary | ICD-10-CM

## 2018-06-02 DIAGNOSIS — R928 Other abnormal and inconclusive findings on diagnostic imaging of breast: Secondary | ICD-10-CM

## 2018-06-05 ENCOUNTER — Ambulatory Visit: Payer: Medicaid Other

## 2018-06-12 ENCOUNTER — Ambulatory Visit
Admission: RE | Admit: 2018-06-12 | Discharge: 2018-06-12 | Disposition: A | Discharge status: Home / Self Care | Payer: Medicaid Other | Source: Ambulatory Visit | Attending: Surgery | Admitting: Surgery

## 2018-06-12 DIAGNOSIS — Z1239 Encounter for other screening for malignant neoplasm of breast: Secondary | ICD-10-CM | POA: Insufficient documentation

## 2018-06-12 DIAGNOSIS — R928 Other abnormal and inconclusive findings on diagnostic imaging of breast: Secondary | ICD-10-CM

## 2018-06-12 DIAGNOSIS — Z9889 Other specified postprocedural states: Secondary | ICD-10-CM

## 2018-06-12 DIAGNOSIS — R922 Inconclusive mammogram: Secondary | ICD-10-CM | POA: Insufficient documentation

## 2018-06-26 ENCOUNTER — Emergency Department: Payer: Medicaid Other

## 2018-06-26 ENCOUNTER — Ambulatory Visit
Admission: RE | Admit: 2018-06-26 | Discharge: 2018-06-26 | Disposition: A | Discharge status: Home / Self Care | Payer: Medicaid Other | Source: Ambulatory Visit | Attending: Surgery | Admitting: Surgery

## 2018-06-26 ENCOUNTER — Other Ambulatory Visit: Payer: Medicaid Other

## 2018-06-26 ENCOUNTER — Emergency Department
Admission: EM | Admit: 2018-06-26 | Discharge: 2018-06-26 | Disposition: A | Discharge status: Home / Self Care | Payer: Medicaid Other | Attending: Emergency Medicine | Admitting: Emergency Medicine

## 2018-06-26 DIAGNOSIS — N6001 Solitary cyst of right breast: Secondary | ICD-10-CM | POA: Insufficient documentation

## 2018-06-26 DIAGNOSIS — Z9889 Other specified postprocedural states: Secondary | ICD-10-CM | POA: Insufficient documentation

## 2018-06-26 DIAGNOSIS — R928 Other abnormal and inconclusive findings on diagnostic imaging of breast: Secondary | ICD-10-CM

## 2018-06-26 DIAGNOSIS — J209 Acute bronchitis, unspecified: Secondary | ICD-10-CM | POA: Insufficient documentation

## 2018-06-26 DIAGNOSIS — F17200 Nicotine dependence, unspecified, uncomplicated: Secondary | ICD-10-CM | POA: Insufficient documentation

## 2018-06-26 LAB — GROUP A STREP, RAPID ANTIGEN: Group A Strep, Rapid Antigen: NEGATIVE

## 2018-06-26 MED ORDER — ALBUTEROL SULFATE HFA 108 (90 BASE) MCG/ACT IN AERS
1.0000 | INHALATION_SPRAY | RESPIRATORY_TRACT | 1 refills | Status: DC | PRN
Start: 2018-06-26 — End: 2018-07-26

## 2018-06-26 MED ORDER — AZITHROMYCIN 250 MG PO TABS
500.00 mg | ORAL_TABLET | Freq: Once | ORAL | Status: AC
Start: 2018-06-26 — End: 2018-06-26
  Administered 2018-06-26: 17:00:00 500 mg via ORAL
  Filled 2018-06-26: qty 2

## 2018-06-26 MED ORDER — FLUTICASONE PROPIONATE HFA 110 MCG/ACT IN AERO
1.00 | INHALATION_SPRAY | Freq: Two times a day (BID) | RESPIRATORY_TRACT | Status: DC
Start: 2018-06-26 — End: 2018-06-26

## 2018-06-26 MED ORDER — IBUPROFEN 200 MG PO TABS
400.0000 mg | ORAL_TABLET | Freq: Three times a day (TID) | ORAL | 0 refills | Status: DC | PRN
Start: 2018-06-26 — End: 2018-08-15

## 2018-06-26 MED ORDER — AZITHROMYCIN 250 MG PO TABS
250.00 mg | ORAL_TABLET | Freq: Every day | ORAL | 0 refills | Status: AC
Start: 2018-06-27 — End: 2018-06-30

## 2018-06-26 MED ORDER — FLUTICASONE PROPIONATE HFA 110 MCG/ACT IN AERO
1.00 | INHALATION_SPRAY | Freq: Once | RESPIRATORY_TRACT | Status: AC
Start: 2018-06-26 — End: 2018-06-26
  Administered 2018-06-26: 1 via RESPIRATORY_TRACT
  Filled 2018-06-26: qty 12

## 2018-06-26 MED ORDER — ALBUTEROL SULFATE HFA 108 (90 BASE) MCG/ACT IN AERS
2.00 | INHALATION_SPRAY | Freq: Once | RESPIRATORY_TRACT | Status: AC
Start: 2018-06-26 — End: 2018-06-26
  Administered 2018-06-26: 17:00:00 2 via RESPIRATORY_TRACT
  Filled 2018-06-26: qty 1

## 2018-06-26 MED ORDER — PREDNISONE 20 MG PO TABS
80.00 mg | ORAL_TABLET | Freq: Once | ORAL | Status: AC
Start: 2018-06-26 — End: 2018-06-26
  Administered 2018-06-26: 17:00:00 80 mg via ORAL
  Filled 2018-06-26: qty 4

## 2018-06-26 NOTE — Discharge Instructions (Signed)
Study results to go  Activity as tolerated  Albuterol MDI - 1-2-3 puffs every four hours for breathing difficulty  Flovent 110 MDI - two puffs twice daily for ten days  Zithromax antibiotic for four more days  Advil/Motrin/Ibuprofen - 400 mg every eight hours for fever/pain  Acetaminophen/Tylenol - 500 mg every four hours for fever/pain  Continue current medications  Fluids - keep hydrated  Return if you are not improving or are worse

## 2018-06-26 NOTE — ED Provider Notes (Signed)
Physician/Midlevel provider first contact with patient: 06/26/18 1537         History     Chief Complaint   Patient presents with    Cough     HPI     Time seen: 1430 - #30    Historian: patient    Chief complaint: cough and congestion with breathing difficulty    HPI: the patient presents with the gradual onset of the above starting   ~ one week ago    Time of onset: ~ one week ago    Severity: moderate for all sx's    Quality: no pain - dry cough    Improved by: nothing    Worsened by: exertion and supine position    Accompanied by: sensation of feeling warm    Complaint experienced before: no - no h/o asthma    Past Medical History:   Diagnosis Date    Anemia     no meds    Breast lump 10/27/2017     Past Surgical History:   Procedure Laterality Date    BREAST BIOPSY Left 2019    D & E, SUCTION N/A 09/10/2016    Procedure: D & E, SUCTION;  Surgeon: Erenest Blank, MD;  Location: [facility];  Service: Gynecology;  Laterality: N/A;    INDUCED ABORTION      x2    LEEP LLETZ  05/21/2013    Procedure: LEEP LLETZ;  Surgeon: Jeanie Cooks, MD;  Location: [facility];  Service: Gynecology;  Laterality: N/A;    VAGINAL DELIVERY  2000     Family History   Problem Relation Age of Onset    Breast cancer Mother 74    No known problems Father     Breast cancer Paternal Aunt 56        Paternal half-aunt    Breast cancer Cousin 42        Paternal half-cousin; Pt reports genetic testing was negative, report not reviewed    Ovarian cancer Neg Hx      Social  Social History     Tobacco Use    Smoking status: Light Tobacco Smoker     Years: 3.00    Smokeless tobacco: Former Neurosurgeon    Tobacco comment: social    Substance Use Topics    Alcohol use: Yes    Drug use: No   .   Allergies   Allergen Reactions    Penicillins Other (See Comments) and Rash     Has patient had a PCN reaction causing immediate rash, facial/tongue/throat swelling, SOB or lightheadedness with hypotension: Yes  Has patient had a PCN  reaction causing severe rash involving mucus membranes or skin necrosis: No  Has patient had a PCN reaction that required hospitalization: Yes  Has patient had a PCN reaction occurring within the last 10 years: Yes  If all of the above answers are "NO", then may proceed with Cephalosporin use.  Was told by parent she was allergic as child     Home Medications     Med List Status:  In Progress Set By: Mena Pauls, RN at 06/26/2018  3:59 PM                ibuprofen (ADVIL,MOTRIN) 600 MG tablet     Take 1 tablet (600 mg total) by mouth every 6 (six) hours as needed for Pain.     naproxen (NAPROSYN) 500 MG tablet     Take 1 tablet (500 mg  total) by mouth 2 (two) times daily with meals.     Prenatal Multivit-Min-Fe-FA (PRENATAL 1 + IRON PO)     Take 1 tablet by mouth daily.         Review of Systems   Constitutional: Positive for activity change. Negative for chills, diaphoresis and fatigue.   HENT: Positive for congestion. Negative for postnasal drip, rhinorrhea, sinus pressure, sinus pain, sneezing, sore throat, trouble swallowing and voice change.    Eyes: Negative for discharge, redness, itching and visual disturbance.   Respiratory: Positive for cough, chest tightness and shortness of breath. Negative for choking, wheezing and stridor.    Cardiovascular: Negative for chest pain, palpitations and leg swelling.   Gastrointestinal: Negative for abdominal pain and nausea.   Endocrine: Negative for polydipsia and polyuria.   Genitourinary: Negative for dysuria, flank pain, frequency and pelvic pain.   Musculoskeletal: Negative for arthralgias, back pain, joint swelling, myalgias, neck pain and neck stiffness.   Skin: Negative for color change, pallor and rash.   Neurological: Negative for dizziness, weakness, light-headedness and headaches.   Psychiatric/Behavioral: Negative for confusion and decreased concentration.     Physical Exam    BP: 112/77, Heart Rate: 75, Temp: 98.7 F (37.1 C), Resp Rate: 16, SpO2: 98 %,  Weight: 74.8 kg    Physical Exam  Vitals signs and nursing note reviewed.   Constitutional:       General: She is not in acute distress.     Appearance: Normal appearance. She is not ill-appearing, toxic-appearing or diaphoretic.   HENT:      Right Ear: Tympanic membrane and ear canal normal.      Left Ear: Tympanic membrane and ear canal normal.      Nose: No congestion or rhinorrhea.      Mouth/Throat:      Mouth: Mucous membranes are moist.      Pharynx: No oropharyngeal exudate or posterior oropharyngeal erythema.   Eyes:      Conjunctiva/sclera: Conjunctivae normal.   Neck:      Musculoskeletal: Normal range of motion and neck supple. No neck rigidity or muscular tenderness.   Cardiovascular:      Rate and Rhythm: Normal rate and regular rhythm.      Heart sounds: Normal heart sounds.   Pulmonary:      Effort: Respiratory distress present.      Breath sounds: No stridor. Wheezing present. No rhonchi.   Chest:      Chest wall: No tenderness.   Abdominal:      General: There is no distension.      Palpations: Abdomen is soft.      Tenderness: There is no abdominal tenderness. There is no right CVA tenderness, left CVA tenderness or guarding.   Musculoskeletal:         General: No swelling or tenderness.      Right lower leg: No edema.      Left lower leg: No edema.   Lymphadenopathy:      Cervical: No cervical adenopathy.   Skin:     General: Skin is warm and dry.      Findings: No erythema or rash.   Neurological:      General: No focal deficit present.      Mental Status: She is alert and oriented to person, place, and time.      Motor: No weakness.   Psychiatric:         Mood and Affect: Mood normal.  Behavior: Behavior normal.         Thought Content: Thought content normal.         Judgment: Judgment normal.       MDM and ED Course     ED Medication Orders (From admission, onward)    Start Ordered     Status Ordering Provider    06/26/18 2000 06/26/18 1650    RT - 2 times daily     Route: Inhalation   Ordered Dose: 1 puff     Discontinued Aleana Fifita G    06/26/18 1703 06/26/18 1702  fluticasone (FLOVENT HFA) 110 MCG/ACT inhaler 1 puff  RT - Once     Route: Inhalation  Ordered Dose: 1 puff     Last MAR action:  Given Terri Skains    06/26/18 1649 06/26/18 1648  azithromycin (ZITHROMAX) tablet 500 mg  Once     Route: Oral  Ordered Dose: 500 mg     Last MAR action:  Given Terri Skains    06/26/18 1649 06/26/18 1648  albuterol (PROVENTIL HFA;VENTOLIN HFA) inhaler 2 puff  RT - Once     Route: Inhalation  Ordered Dose: 2 puff     Last MAR action:  Given Terri Skains    06/26/18 1649 06/26/18 1648  predniSONE (DELTASONE) tablet 80 mg  Once     Route: Oral  Ordered Dose: 80 mg     Last MAR action:  Given Cathryne Mancebo G         A discussion was held with the patient about the results   of the examination and the studies done and about the implications   of these results. A treatment and follow-up plan was developed and   agreed upon with the patient    MDM  Number of Diagnoses or Management Options  Acute bronchitis with bronchospasm: new and requires workup     Amount and/or Complexity of Data Reviewed  Clinical lab tests: ordered and reviewed  Independent visualization of images, tracings, or specimens: yes (I have reviewed the  imaging study(s) and I agree with the interpretation(s) by the radiologist)    Risk of Complications, Morbidity, and/or Mortality  Presenting problems: moderate  Diagnostic procedures: low  Management options: low    Patient Progress  Patient progress: improved          Procedures    Clinical Impression & Disposition     Clinical Impression  Final diagnoses:   Acute bronchitis with bronchospasm      ED Disposition     ED Disposition Condition Date/Time Comment    Discharge  Mon Jun 26, 2018  6:18 PM [name] discharge to home/self care.    Condition at disposition: Stable         New Prescriptions    ALBUTEROL (PROVENTIL HFA;VENTOLIN HFA) 108 (90 BASE)  MCG/ACT INHALER    Inhale 1-3 puffs into the lungs every 4 (four) hours as needed for Wheezing    AZITHROMYCIN (ZITHROMAX) 250 MG TABLET    Take 1 tablet (250 mg total) by mouth daily for 3 days    IBUPROFEN (ADVIL,MOTRIN) 200 MG TABLET    Take 2 tablets (400 mg total) by mouth every 8 (eight) hours as needed for Pain or Fever                 Terri Skains, MD  06/29/18 0211

## 2018-06-26 NOTE — ED Triage Notes (Signed)
Cough, congestion and generalized weakness x 1 week. She says that she is unable to breath when laying flat. She feels as though she has had a fever.

## 2018-07-27 ENCOUNTER — Ambulatory Visit: Payer: Medicaid Other

## 2018-07-27 ENCOUNTER — Other Ambulatory Visit: Payer: Medicaid Other

## 2018-08-15 ENCOUNTER — Encounter (INDEPENDENT_AMBULATORY_CARE_PROVIDER_SITE_OTHER): Payer: Self-pay | Admitting: Family Medicine

## 2018-08-15 ENCOUNTER — Ambulatory Visit (INDEPENDENT_AMBULATORY_CARE_PROVIDER_SITE_OTHER): Payer: Medicaid Other | Admitting: Family Medicine

## 2018-08-15 VITALS — BP 106/68 | HR 76 | Temp 97.5°F | Resp 17 | Ht 65.5 in | Wt 165.0 lb

## 2018-08-15 DIAGNOSIS — S92101S Unspecified fracture of right talus, sequela: Secondary | ICD-10-CM
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
## 2018-09-06 ENCOUNTER — Telehealth (INDEPENDENT_AMBULATORY_CARE_PROVIDER_SITE_OTHER): Payer: Self-pay | Admitting: Family Medicine

## 2018-09-06 NOTE — Telephone Encounter (Addendum)
Dr. Tinnie Gens,     Patient called states her friend was test positive for covid -19  3 days ago, patient has been around  her a lots, patient has no s/p such as cough , temp [place], shortnes of breath.    Spoken with patient as per Dr. Tinnie Gens states, if patient has s/p of covid-19 to contact staff for video visit for further evaluation.

## 2018-10-30 NOTE — Progress Notes (Signed)
Subjective:      Date: 10/31/2018 10:56 AM   Patient ID: Judy Aguirre is a 38 y.o. female.    Chief Complaint:  Chief Complaint   Patient presents with    Neck Pain     right with swollen 10 months       HPI: Patient is a 38 y/o female who presents for acute visit:    Neck swelling - states that for the past 8-10 months has had mild swelling of right side of neck. Does notice mild increased swelling when she does hookah. No fevers/sweats/chills/weight loss, no difficulty swallowing. She states that her brother had similar problem of neck swelling but is unclear what the issue was. No history of thyroid disease.       Problem List:  Patient Active Problem List   Diagnosis    High grade squamous intraepithelial cervical dysplasia    History of preterm delivery    HSV-2 seropositive    Cystic hygroma    Breast lump    Abnormal findings on diagnostic imaging of breast    Family history of malignant neoplasm of breast       Current Medications:  No outpatient medications have been marked as taking for the 10/31/18 encounter (Office Visit) with Tinnie Gens, Faron Tudisco, DO.       Allergies:  Allergies   Allergen Reactions    Penicillins Other (See Comments) and Rash     Has patient had a PCN reaction causing immediate rash, facial/tongue/throat swelling, SOB or lightheadedness with hypotension: Yes  Has patient had a PCN reaction causing severe rash involving mucus membranes or skin necrosis: No  Has patient had a PCN reaction that required hospitalization: Yes  Has patient had a PCN reaction occurring within the last 10 years: Yes  If all of the above answers are "NO", then may proceed with Cephalosporin use.  Was told by parent she was allergic as child       Past Medical History:  Past Medical History:   Diagnosis Date    Anemia     no meds    Asthma     Breast lump 10/27/2017    Diabetes mellitus     Hyperlipidemia     Seasonal allergic rhinitis        Past Surgical History:  Past Surgical History:    Procedure Laterality Date    D & E, SUCTION N/A 09/10/2016    Procedure: D & E, SUCTION;  Surgeon: Erenest Blank, MD;  Location: [facility];  Service: Gynecology;  Laterality: N/A;    INDUCED ABORTION      x2    LEEP LLETZ  05/21/2013    Procedure: LEEP LLETZ;  Surgeon: Jeanie Cooks, MD;  Location: [facility];  Service: Gynecology;  Laterality: N/A;    VAGINAL DELIVERY  2000       Family History:  Family History   Problem Relation Age of Onset    Breast cancer Mother 21    Cancer Mother     No known problems Father     Breast cancer Paternal Aunt 11        Paternal half-aunt    Cancer Paternal Aunt     Breast cancer Cousin 12        Paternal half-cousin; Pt reports genetic testing was negative, report not reviewed    No known problems Son     Ovarian cancer Neg Hx        Social History:  Social History     Tobacco Use    Smoking status: Light Tobacco Smoker     Years: 3.00    Smokeless tobacco: Former Neurosurgeon    Tobacco comment: social    Substance Use Topics    Alcohol use: Never     Frequency: Never    Drug use: No         The following sections were reviewed this encounter by the provider:   Tobacco   Allergies   Meds   Problems   Med Hx   Surg Hx   Fam Hx            ROS:  General/Constitutional:   Denies Chills. Denies Fatigue. Denies Fever.   Ophthalmologic:   Denies Blurred vision.   ENT:   Denies Nasal Discharge. Denies Sinus pain. Denies Sore throat. Right sided neck swelling  Respiratory:   Denies Cough. Denies Shortness of breath. Denies Wheezing.   Cardiovascular:   Denies Chest pain. Denies Chest pain with exertion. Denies Palpitations. Denies  Swelling in hands/feet.   Gastrointestinal:   Denies Abdominal pain. Denies Constipation. Denies Diarrhea. Denies Nausea. Denies  Vomiting.   Skin:   Denies Rash.   Neurologic:   Denies Dizziness. Denies Headache. Denies Tingling/Numbness.     Objective:   Vitals:  BP 105/70    Pulse 69    Temp 97.5 F (36.4 C) (Oral)    Resp 17    Ht  1.651 m (5\' 5" )    Wt 74.8 kg (165 lb)    LMP 10/24/2018    SpO2 99%    BMI 27.46 kg/m       Physical Exam:  General Examination:   GENERAL APPEARANCE: alert, in no acute distress, well developed, well nourished  oriented to time, place, and person.   ORAL CAVITY: normal oropharynx, normal lips, mucosa moist.   NECK/THYROID: neck supple, no carotid bruit, no neck mass palpated - mild swelling of right side of neck, no jugular venous distention, no thyromegaly.  LYMPH: Right cervical LN  HEART: S1, S2 normal, no murmurs, rubs, gallops, regular rate and rhythm.   LUNGS: normal effort / no distress, normal breath sounds, clear to auscultation  bilaterally, no wheezes, rales, rhonchi.   EXTREMITIES: No LE edema bilaterally.   PERIPHERAL PULSES: 2+ dorsalis pedis, 2+ posterior tibial bilaterally.   PSYCH: alert and oriented to time,place and person.   SKIN: moist, no focal rash    Assessment:       1. Neck swelling  - Ultrasound head neck soft tissue; Future  - CBC without differential  - Comprehensive metabolic panel    2. Screening for thyroid disorder  - TSH    3. Screening, lipid  - Lipid panel    4. Screening for diabetes mellitus  - Hemoglobin A1C        Plan:   Neck swelling  Acute on Chronic  -will obtain Neck US due to persistent symptoms  -discussed use of hookah and to monitor use and symptoms    Will obtain screening labs       Follow-up:   Return for Annual Physical Exam.       Deshawnda Acrey, DO

## 2018-10-31 ENCOUNTER — Encounter (INDEPENDENT_AMBULATORY_CARE_PROVIDER_SITE_OTHER): Payer: Self-pay | Admitting: Family Medicine

## 2018-10-31 ENCOUNTER — Ambulatory Visit (INDEPENDENT_AMBULATORY_CARE_PROVIDER_SITE_OTHER): Payer: Medicaid Other | Admitting: Family Medicine

## 2018-10-31 VITALS — BP 105/70 | HR 69 | Temp 97.5°F | Resp 17 | Ht 65.0 in | Wt 165.0 lb

## 2018-10-31 DIAGNOSIS — R221 Localized swelling, mass and lump, neck: Secondary | ICD-10-CM

## 2018-10-31 DIAGNOSIS — Z131 Encounter for screening for diabetes mellitus: Secondary | ICD-10-CM

## 2018-10-31 DIAGNOSIS — Z13220 Encounter for screening for lipoid disorders: Secondary | ICD-10-CM

## 2018-10-31 DIAGNOSIS — Z1329 Encounter for screening for other suspected endocrine disorder: Secondary | ICD-10-CM

## 2018-10-31 LAB — CBC
Absolute NRBC: 0 10*3/uL (ref 0.00–0.00)
Hematocrit: 42.2 % (ref 34.7–43.7)
Hgb: 12.6 g/dL (ref 11.4–14.8)
MCH: 26.2 pg (ref 25.1–33.5)
MCHC: 29.9 g/dL — ABNORMAL LOW (ref 31.5–35.8)
MCV: 87.7 fL (ref 78.0–96.0)
MPV: 9.7 fL (ref 8.9–12.5)
Nucleated RBC: 0 /100 WBC (ref 0.0–0.0)
Platelets: 390 10*3/uL — ABNORMAL HIGH (ref 142–346)
RBC: 4.81 10*6/uL (ref 3.90–5.10)
RDW: 15 % (ref 11–15)
WBC: 7.42 10*3/uL (ref 3.10–9.50)

## 2018-10-31 LAB — COMPREHENSIVE METABOLIC PANEL
ALT: 11 U/L (ref 0–55)
AST (SGOT): 12 U/L (ref 5–34)
Albumin/Globulin Ratio: 1.1 (ref 0.9–2.2)
Albumin: 3.9 g/dL (ref 3.5–5.0)
Alkaline Phosphatase: 67 U/L (ref 37–106)
Anion Gap: 8 (ref 5.0–15.0)
BUN: 7 mg/dL (ref 7.0–19.0)
Bilirubin, Total: 0.4 mg/dL (ref 0.2–1.2)
CO2: 27 mEq/L (ref 21–29)
Calcium: 8.9 mg/dL (ref 8.5–10.5)
Chloride: 103 mEq/L (ref 100–111)
Creatinine: 0.8 mg/dL (ref 0.4–1.5)
Globulin: 3.6 g/dL (ref 2.0–3.7)
Glucose: 97 mg/dL (ref 70–100)
Potassium: 4.2 mEq/L (ref 3.5–5.1)
Protein, Total: 7.5 g/dL (ref 6.0–8.3)
Sodium: 138 mEq/L (ref 136–145)

## 2018-10-31 LAB — LIPID PANEL
Cholesterol / HDL Ratio: 3.3
Cholesterol: 179 mg/dL (ref 0–199)
HDL: 55 mg/dL (ref 40–9999)
LDL Calculated: 101 mg/dL — ABNORMAL HIGH (ref 0–99)
Triglycerides: 117 mg/dL (ref 34–149)
VLDL Calculated: 23 mg/dL (ref 10–40)

## 2018-10-31 LAB — HEMOLYSIS INDEX: Hemolysis Index: 8 (ref 0–18)

## 2018-10-31 LAB — TSH: TSH: 1.83 u[IU]/mL (ref 0.35–4.94)

## 2018-10-31 LAB — HEMOGLOBIN A1C
Average Estimated Glucose: 105.4 mg/dL
Hemoglobin A1C: 5.3 % (ref 4.6–5.9)

## 2018-10-31 LAB — GFR: EGFR: >60

## 2018-11-01 ENCOUNTER — Other Ambulatory Visit (INDEPENDENT_AMBULATORY_CARE_PROVIDER_SITE_OTHER): Payer: Self-pay

## 2018-11-01 ENCOUNTER — Other Ambulatory Visit: Payer: Self-pay | Admitting: Family Medicine

## 2018-11-01 DIAGNOSIS — R221 Localized swelling, mass and lump, neck: Secondary | ICD-10-CM

## 2018-11-01 DIAGNOSIS — N6320 Unspecified lump in the left breast, unspecified quadrant: Secondary | ICD-10-CM
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
## 2019-02-06 LAB — HEPATITIS B SURFACE ANTIGEN W/ REFLEX TO CONFIRMATION: Hepatitis B Surface Antigen: NEGATIVE

## 2019-02-06 LAB — HIV AG/AB 4TH GENERATION: HIV Ag/Ab, 4th Generation: NONREACTIVE

## 2019-02-06 LAB — RUBELLA ANTIBODY, IGG: Rubella AB, IgG: IMMUNE

## 2019-02-21 ENCOUNTER — Telehealth (INDEPENDENT_AMBULATORY_CARE_PROVIDER_SITE_OTHER): Payer: Medicaid Other | Admitting: Family Medicine

## 2019-02-21 ENCOUNTER — Encounter (INDEPENDENT_AMBULATORY_CARE_PROVIDER_SITE_OTHER): Payer: Self-pay | Admitting: Family Medicine

## 2019-02-21 ENCOUNTER — Ambulatory Visit (INDEPENDENT_AMBULATORY_CARE_PROVIDER_SITE_OTHER): Payer: Medicaid Other

## 2019-02-21 DIAGNOSIS — Z20822 Contact with and (suspected) exposure to covid-19: Secondary | ICD-10-CM

## 2019-02-21 DIAGNOSIS — G4452 New daily persistent headache (NDPH): Secondary | ICD-10-CM

## 2019-02-21 DIAGNOSIS — J029 Acute pharyngitis, unspecified: Secondary | ICD-10-CM

## 2019-02-21 DIAGNOSIS — Z3A09 9 weeks gestation of pregnancy: Secondary | ICD-10-CM

## 2019-02-21 DIAGNOSIS — Z20828 Contact with and (suspected) exposure to other viral communicable diseases: Secondary | ICD-10-CM

## 2019-02-21 NOTE — Progress Notes (Signed)
Subjective:      Date: 02/21/2019 11:11 AM   Patient ID: Judy Aguirre is a 38 y.o. female.    Chief Complaint:  Chief Complaint   Patient presents with    Sore Throat     Throat itchy    Nasal Congestion     Denies fever.     Fatigue     Verbal consent has been obtained from the patient to conduct a video and telephone visit to minimize exposure to COVID-19: yes    HPI: Patient is a 38 y/o female who presents for acute visit:     COVID-19 Questionnaire:  Last updated: Aug 24, 2018    Fever: no  Cough: no  Dyspnea: no  Additional Symptoms: fatigue and sore throat  Close contact with a laboratory confirmed COVID-19 person: no  Therapist, occupational:  no  Immunocompromised:  no immunocompromising conditions  Long term facility or nursing home resident: No  Sole provider for dependent at high risk:  no  Pregnant 3rd trimester or High Risk Pregnancy: No      Started having symptoms on Sunday/Monday - states that she was outside, she is also [redacted] weeks pregnant - for the past 2 days has had some sweating at night (unsure if it is because heat hot). Lives with her aunt - no one else sick at home. Has been volunteering with some election things. She has had some fatigue, nasal congestion/sore throat.       Problem List:  Patient Active Problem List   Diagnosis    High grade squamous intraepithelial cervical dysplasia    History of preterm delivery    HSV-2 seropositive    Cystic hygroma    Breast lump    Abnormal findings on diagnostic imaging of breast    Family history of malignant neoplasm of breast       Current Medications:  Outpatient Medications Marked as Taking for the 02/21/19 encounter (Telemedicine Visit) with Sandrea Matte, DO   Medication Sig Dispense Refill    Prenatal MV-Min-Fe Fum-FA-DHA (PRENATAL 1 PO) Take by mouth         Allergies:  Allergies   Allergen Reactions    Penicillins Other (See Comments) and Rash     Has patient had a PCN reaction causing immediate  rash, facial/tongue/throat swelling, SOB or lightheadedness with hypotension: Yes  Has patient had a PCN reaction causing severe rash involving mucus membranes or skin necrosis: No  Has patient had a PCN reaction that required hospitalization: Yes  Has patient had a PCN reaction occurring within the last 10 years: Yes  If all of the above answers are "NO", then may proceed with Cephalosporin use.  Was told by parent she was allergic as child       Past Medical History:  Past Medical History:   Diagnosis Date    Anemia     no meds    Asthma     Breast lump 10/27/2017    Diabetes mellitus     Hyperlipidemia     Seasonal allergic rhinitis        Past Surgical History:  Past Surgical History:   Procedure Laterality Date    D & E, SUCTION N/A 09/10/2016    Procedure: D & E, SUCTION;  Surgeon: Erenest Blank, MD;  Location: [facility];  Service: Gynecology;  Laterality: N/A;    INDUCED ABORTION      x2    LEEP LLETZ  05/21/2013  Procedure: LEEP LLETZ;  Surgeon: Jeanie Cooks, MD;  Location: [facility];  Service: Gynecology;  Laterality: N/A;    VAGINAL DELIVERY  2000       Family History:  Family History   Problem Relation Age of Onset    Breast cancer Mother 51    Cancer Mother     No known problems Father     Breast cancer Paternal Aunt 61        Paternal half-aunt    Cancer Paternal Aunt     Breast cancer Cousin 59        Paternal half-cousin; Pt reports genetic testing was negative, report not reviewed    No known problems Son     Ovarian cancer Neg Hx        Social History:  Social History     Tobacco Use    Smoking status: Light Tobacco Smoker     Years: 3.00    Smokeless tobacco: Former Neurosurgeon    Tobacco comment: social    Substance Use Topics    Alcohol use: Never     Frequency: Never    Drug use: No         The following sections were reviewed this encounter by the provider:   Tobacco   Allergies   Meds   Problems   Med Hx   Surg Hx   Fam Hx             ROS:  General/Constitutional:   Denies Chills. Admits Fatigue. Denies Fever.   Ophthalmologic:   Denies Blurred vision.   ENT:   Admits Nasal Discharge. Denies Sinus pain. Admits Sore throat.   Respiratory:   Denies Cough. Denies Shortness of breath. Denies Wheezing.   Cardiovascular:   Denies Chest pain. Denies Chest pain with exertion. Denies Palpitations. Denies  Swelling in hands/feet.   Gastrointestinal:   Denies Abdominal pain. Denies Constipation. Denies Diarrhea. Denies Nausea. Denies  Vomiting.   Skin:   Denies Rash.   Neurologic:   Denies Dizziness. Denies Headache. Denies Tingling/Numbness.     Objective:   Vitals:  LMP 12/19/2018 (Exact Date)       Physical Exam:  General Examination:   GENERAL APPEARANCE: alert, in no acute distress, well developed, well nourished  oriented to time, place, and person.    PSYCH: alert and oriented to time,place and person.   SKIN: moist, no focal rash    Assessment:       1. Suspected COVID-19 virus infection  - COVID-19 (SARS-CoV-2); Future    2. [redacted] weeks gestation of pregnancy        Plan:   Suspected COVID-19 virus infection  Due to mild symptoms and pregnant state will recommend COVID 19 testing  -Continue to self -monitor for change in symptoms  -Recommend self-isolation until results return  -ER precautions discussed   COVID-19: Patient Instructions (home isolation)    Stay home if you are sick. Stay home if you have symptoms of a fever, cough, or shortness of breath.   People who are mildly ill with COVID-19 are able to recover at home. Do not leave, except to get medical care. Do not visit public areas.  If a member of your household is sick, stay home from school and work to avoid spreading COVID-19 to others.   If your children are in the care of others, urge caregivers to watch for COVID-19 symptoms.    Continue practicing everyday preventive actions. Cover coughs and sneezes with  a tissue and wash your hands often with soap and water for at least 20  seconds. If soap and water are not available, use a hand sanitizer that contains 60% alcohol. Clean frequently touched surfaces and objects daily using a regular household detergent and water.    Use the separate room and bathroom you prepared for sick household members (if possible).  Separate yourself from other people in your home, this is known as home isolation. Stay away from others: As much as possible, you should stay in a specific sick room and away from other people in your home. Avoid sharing personal items like food and drinks. Provide your sick household member with clean disposable facemasks to wear at home, if available, to help prevent spreading COVID-19 to others. Clean the sick room and bathroom, as needed, to avoid unnecessary contact with the sick person.   If surfaces are dirty, they should be cleaned using a detergent and water prior to disinfection. For disinfection, a list of products with EPA-approved emerging viral pathogens claims, maintained by the CDC (See CDC website for more information).  Always follow the manufacturers instructions for all cleaning and disinfection products.    Stay in touch with others by phone or email. If you live alone and become sick during a COVID-19 outbreak, you may need help. If you have a chronic medical condition and live alone, ask family, friends, and health care providers to check on you during an outbreak. Stay in touch with family and friends with chronic medical conditions.     Call ahead before visiting your doctor  Call ahead: If you have a medical appointment, call your doctors office or emergency department, and tell them you have or may have COVID-19. This will help the office protect themselves and other patients.    Wear a facemask if you are sick  If you are sick: You should wear a facemask when you are around other people and before you enter a healthcare providers office.  If you are caring for others: If the person who is sick is not  able to wear a facemask (for example, because it causes trouble breathing), then people who live in the home should stay in a different room. When caregivers enter the room of the sick person, they should wear a facemask. Visitors, other than caregivers, are not recommended.    Monitor your symptoms  If you develop emergency warning signs for COVID-19 get medical attention immediately. Emergency warning signs include:  Difficulty breathing or shortness of breath  Persistent pain or pressure in the chest  New confusion or inability to arouse  Bluish lips or face    If you are experiencing COVID-19 symptoms, call our patient access center at [phone] to speak with a healthcare professional.  You can also contact your primary care office if you have any questions or concerns.  Call your doctor before going in: Before going to the doctors office or emergency room, call ahead and tell them your symptoms. They will tell you what to do.  If possible, put on a facemask before you enter the building. If you cant put on a facemask, try to keep a safe distance from other people (at least 6 feet away). This will help protect the people in the office or waiting room.    Discontinuing home isolation:  People with a negative COVID-19 test can discontinue home isolation.    People with confirmed COVID-19 or suspected COVID-19 infection who have stayed home (home isolated)  can stop home isolation under the following conditions:   If you will not have a test to determine if you are still contagious, you can leave home after these three things have happened:  You have had no fever for at least 72 hours (without the use medicine that reduces fevers)  AND  other symptoms have improved (for example, when your cough or shortness of breath have improved)  AND  at least 10 days have passed since your symptoms first appeared    If you will be tested to determine if you are still contagious, you can leave home after these three things  have happened:   You no longer have a fever (without the use medicine that reduces fevers)  AND  other symptoms have improved (for example, when your cough or shortness of breath have improved)  AND  you received two negative tests in a row, 24 hours apart.       Start taking claritin and flonase     Time spent in discussion: 15 minutes  Zoom Video Visit Conducted     Follow-up:   Return if symptoms worsen or fail to improve.       Pura Picinich, DO

## 2019-02-22 LAB — COVID-19 (SARS-COV-2): SARS CoV 2 Overall Result: NOT DETECTED

## 2019-02-26 ENCOUNTER — Telehealth (INDEPENDENT_AMBULATORY_CARE_PROVIDER_SITE_OTHER): Payer: Self-pay | Admitting: Family Medicine

## 2019-02-26 NOTE — Telephone Encounter (Signed)
Pt need a disable parking placard to be continued. Please fill up the DMV form and let pt know when is ready to be pick up.    Thank you.

## 2019-02-28 ENCOUNTER — Encounter (INDEPENDENT_AMBULATORY_CARE_PROVIDER_SITE_OTHER): Payer: Self-pay

## 2019-02-28 NOTE — Telephone Encounter (Signed)
Called patient couple of times. No answer. Left message on voice mail DMV Form ready to pick up. Left form at the front for patient to pick up. Copy scanned in Epic.

## 2019-02-28 NOTE — Telephone Encounter (Signed)
Patient called back to check the status of her request. She needs to renew her disable parking placard. As per patient, last time it didn't take this long. I apologized and reminded her the doctor was not in the office. Doctor is in the office today. Patient understood.

## 2019-02-28 NOTE — Telephone Encounter (Signed)
Form completed.

## 2019-04-20 NOTE — L&D Delivery Note (Signed)
Delivery Note    Patient: Judy Aguirre,Judy Aguirre     Problems: Principal Problem:    Uterine contractions during pregnancy  Active Problems:    HSV-2 seropositive    Elderly multigravida, third trimester    [redacted] weeks gestation of pregnancy      Procedure: Spontaneous vaginal delivery    Delivered a Female ; infant from Direct OA presentation. Delayed cord clamping one minute.    Infant Wt:     3440 g    Apgars:  Apgar 1 minute: 8  Apgar 5 minute: 9    Placenta and Cord:          Mechanism: spontaneous        Description:  complete, 3 vessel cord      Episiotomy: No   Lacerations: No   Laceration Type: N/A   Episiotomy/Laceration Repair: No     Estimated Blood Loss:  100 mL        Specimens: none. Placenta discarded.           Complications:  none           Condition: stable      Sinclair Grooms, MD, FACOG  [phone]

## 2019-05-26 ENCOUNTER — Emergency Department: Payer: No Typology Code available for payment source

## 2019-05-26 ENCOUNTER — Emergency Department
Admission: EM | Admit: 2019-05-26 | Discharge: 2019-05-26 | Disposition: A | Discharge status: Home / Self Care | Payer: No Typology Code available for payment source | Attending: Emergency Medicine | Admitting: Emergency Medicine

## 2019-05-26 DIAGNOSIS — O99282 Endocrine, nutritional and metabolic diseases complicating pregnancy, second trimester: Secondary | ICD-10-CM | POA: Insufficient documentation

## 2019-05-26 DIAGNOSIS — O24912 Unspecified diabetes mellitus in pregnancy, second trimester: Secondary | ICD-10-CM | POA: Insufficient documentation

## 2019-05-26 DIAGNOSIS — R42 Dizziness and giddiness: Secondary | ICD-10-CM | POA: Insufficient documentation

## 2019-05-26 DIAGNOSIS — R109 Unspecified abdominal pain: Secondary | ICD-10-CM | POA: Insufficient documentation

## 2019-05-26 DIAGNOSIS — Z3490 Encounter for supervision of normal pregnancy, unspecified, unspecified trimester: Secondary | ICD-10-CM

## 2019-05-26 DIAGNOSIS — R079 Chest pain, unspecified: Secondary | ICD-10-CM

## 2019-05-26 DIAGNOSIS — R0602 Shortness of breath: Secondary | ICD-10-CM | POA: Insufficient documentation

## 2019-05-26 DIAGNOSIS — O09522 Supervision of elderly multigravida, second trimester: Secondary | ICD-10-CM | POA: Insufficient documentation

## 2019-05-26 DIAGNOSIS — E785 Hyperlipidemia, unspecified: Secondary | ICD-10-CM | POA: Insufficient documentation

## 2019-05-26 DIAGNOSIS — Z3A23 23 weeks gestation of pregnancy: Secondary | ICD-10-CM | POA: Insufficient documentation

## 2019-05-26 DIAGNOSIS — O99891 Other specified diseases and conditions complicating pregnancy: Secondary | ICD-10-CM | POA: Insufficient documentation

## 2019-05-26 LAB — CBC AND DIFFERENTIAL
Absolute NRBC: 0 10*3/uL (ref 0.00–0.00)
Basophils Absolute Automated: 0.03 10*3/uL (ref 0.00–0.08)
Basophils Automated: 0.3 %
Eosinophils Absolute Automated: 0.13 10*3/uL (ref 0.00–0.44)
Eosinophils Automated: 1.3 %
Hematocrit: 31.1 % — ABNORMAL LOW (ref 34.7–43.7)
Hgb: 10 g/dL — ABNORMAL LOW (ref 11.4–14.8)
Immature Granulocytes Absolute: 0.07 10*3/uL (ref 0.00–0.07)
Immature Granulocytes: 0.7 %
Lymphocytes Absolute Automated: 1.55 10*3/uL (ref 0.42–3.22)
Lymphocytes Automated: 16.1 %
MCH: 27 pg (ref 25.1–33.5)
MCHC: 32.2 g/dL (ref 31.5–35.8)
MCV: 84.1 fL (ref 78.0–96.0)
MPV: 8.9 fL (ref 8.9–12.5)
Monocytes Absolute Automated: 0.66 10*3/uL (ref 0.21–0.85)
Monocytes: 6.8 %
Neutrophils Absolute: 7.2 10*3/uL — ABNORMAL HIGH (ref 1.10–6.33)
Neutrophils: 74.8 %
Nucleated RBC: 0 /100 WBC (ref 0.0–0.0)
Platelets: 333 10*3/uL (ref 142–346)
RBC: 3.7 10*6/uL — ABNORMAL LOW (ref 3.90–5.10)
RDW: 14 % (ref 11–15)
WBC: 9.64 10*3/uL — ABNORMAL HIGH (ref 3.10–9.50)

## 2019-05-26 LAB — COMPREHENSIVE METABOLIC PANEL
ALT: 21 U/L (ref 0–55)
AST (SGOT): 15 U/L (ref 5–34)
Albumin/Globulin Ratio: 0.9 (ref 0.9–2.2)
Albumin: 2.9 g/dL — ABNORMAL LOW (ref 3.5–5.0)
Alkaline Phosphatase: 63 U/L (ref 37–106)
Anion Gap: 10 (ref 5.0–15.0)
BUN: 5 mg/dL — ABNORMAL LOW (ref 7.0–19.0)
Bilirubin, Total: 0.2 mg/dL (ref 0.2–1.2)
CO2: 22 mEq/L (ref 22–29)
Calcium: 8.7 mg/dL (ref 8.5–10.5)
Chloride: 105 mEq/L (ref 100–111)
Creatinine: 0.6 mg/dL (ref 0.6–1.0)
Globulin: 3.4 g/dL (ref 2.0–3.6)
Glucose: 102 mg/dL — ABNORMAL HIGH (ref 70–100)
Potassium: 3.6 mEq/L (ref 3.5–5.1)
Protein, Total: 6.3 g/dL (ref 6.0–8.3)
Sodium: 137 mEq/L (ref 136–145)

## 2019-05-26 LAB — URINALYSIS REFLEX TO MICROSCOPIC EXAM - REFLEX TO CULTURE
Bilirubin, UA: NEGATIVE
Blood, UA: NEGATIVE
Glucose, UA: NEGATIVE
Ketones UA: NEGATIVE
Nitrite, UA: NEGATIVE
Protein, UR: NEGATIVE
Specific Gravity UA: 1.015 (ref 1.001–1.035)
Urine pH: 7 (ref 5.0–8.0)
Urobilinogen, UA: 0.2 mg/dL (ref 0.2–2.0)

## 2019-05-26 LAB — GFR: EGFR: >60

## 2019-05-26 LAB — TROPONIN I: Troponin I: 0.01 ng/mL (ref 0.00–0.05)

## 2019-05-26 MED ORDER — SODIUM CHLORIDE 0.9 % IV BOLUS
2000.00 mL | Freq: Once | INTRAVENOUS | Status: AC
Start: 2019-05-26 — End: 2019-05-26
  Administered 2019-05-26: 17:00:00 2000 mL via INTRAVENOUS

## 2019-05-26 NOTE — ED Provider Notes (Signed)
EMERGENCY DEPARTMENT HISTORY AND PHYSICAL EXAM     None        Date: 05/26/2019  Patient Name: Judy Aguirre  Attending Physician: Maryella Shivers, MD  Advanced Practice Provider: Volanda Napoleon    History of Presenting Illness       History Provided By: Patient    Chief Complaint:  Chief Complaint   Patient presents with    Back Pain    Shortness of Breath    Dizziness    Abdominal Pain         Additional History: Judy Aguirre is a 39 y.o. female presenting to the ED with lower back pain, lower abd cramping, lightheadedness. Reports that she felt the sxs last night, mild, went to sleep and this morning reports she felt fine, and then after walking around for a few hours sxs started again. She was able to drive herself here. Reports that she also feels slightly sob without any cp. +feeling baby move, although reports it feels slightly less than normal. No VB. Reports that it seems like she has more discharge, no itching without any gush of fluid. No leg swelling.  No urinary sxs. No v/d. Patient is eating at the time of the interview.    [id number] (3 miscarriages). She follows with [place] [facility] (deliver at [facility] Received Makena shot (progesterone) because she reports that with her son, she had contractions early in the pregnancy ~23/24 weeks- ended up delivering at 38 weeks.       PCP: Sandrea Matte, DO  SPECIALISTS:    No current facility-administered medications for this encounter.      Current Outpatient Medications   Medication Sig Dispense Refill    UNABLE TO FIND "Macena" weekly injection to prevent early labor.      Prenatal MV-Min-Fe Fum-FA-DHA (PRENATAL 1 PO) Take by mouth         At the time of evaluation in the ED, the patient does not meet criteria for COVID 19 testing.     PPE: goggles, N95 or respirator, surgical cap, gloves  Past History     Past Medical History:  Past Medical History:   Diagnosis Date    Anemia     no meds    Asthma     Breast lump 10/27/2017     Diabetes mellitus     Hyperlipidemia     Seasonal allergic rhinitis        Past Surgical History:  Past Surgical History:   Procedure Laterality Date    D & E, SUCTION N/A 09/10/2016    Procedure: D & E, SUCTION;  Surgeon: Erenest Blank, MD;  Location:  [facility];  Service: Gynecology;  Laterality: N/A;    INDUCED ABORTION      x2    LEEP LLETZ  05/21/2013    Procedure: LEEP LLETZ;  Surgeon: Jeanie Cooks, MD;  Location: [facility];  Service: Gynecology;  Laterality: N/A;    VAGINAL DELIVERY  2000       Family History:  Family History   Problem Relation Age of Onset    Breast cancer Mother 80    Cancer Mother     No known problems Father     Breast cancer Paternal Aunt 71        Paternal half-aunt    Cancer Paternal Aunt     Breast cancer Cousin 17        Paternal half-cousin; Pt reports genetic testing was negative, report  not reviewed    No known problems Son     Ovarian cancer Neg Hx        Social History:  Social History     Tobacco Use    Smoking status: Former Smoker     Years: 3.00    Smokeless tobacco: Former Neurosurgeon    Tobacco comment: social    Substance Use Topics    Alcohol use: Never     Frequency: Never    Drug use: No       Allergies:  Allergies   Allergen Reactions    Penicillins Other (See Comments) and Rash     Has patient had a PCN reaction causing immediate rash, facial/tongue/throat swelling, SOB or lightheadedness with hypotension: Yes  Has patient had a PCN reaction causing severe rash involving mucus membranes or skin necrosis: No  Has patient had a PCN reaction that required hospitalization: Yes  Has patient had a PCN reaction occurring within the last 10 years: Yes  If all of the above answers are "NO", then may proceed with Cephalosporin use.  Was told by parent she was allergic as child       Review of Systems     Review of Systems   Constitutional: Negative for appetite change, chills and fever.   HENT: Negative for sore throat.    Eyes: Negative for  visual disturbance.   Respiratory: Positive for shortness of breath. Negative for cough.    Cardiovascular: Negative for chest pain.   Gastrointestinal: Positive for abdominal pain. Negative for diarrhea, nausea and vomiting.   Genitourinary: Positive for vaginal discharge. Negative for decreased urine volume and dysuria.   Musculoskeletal: Positive for back pain. Negative for neck pain.   Skin: Negative for rash.   Neurological: Positive for light-headedness. Negative for dizziness and headaches.       Physical Exam     Vitals:    05/26/19 1617 05/26/19 1632 05/26/19 1745 05/26/19 1802   BP: 94/52 133/68 113/65 106/60   Pulse: 83 93 87 87   Resp:  16     Temp:       TempSrc:       SpO2: 96% 98% 99% 100%   Weight:       Height:           Physical Exam   Constitutional: She is oriented to person, place, and time. She appears well-developed and well-nourished. No distress.   HENT:   Head: Normocephalic and atraumatic.   Mouth/Throat: Oropharynx is clear and moist.   Eyes: EOM are normal.   Neck: Normal range of motion. Neck supple.   Cardiovascular: Normal rate, regular rhythm, normal heart sounds and intact distal pulses.   2+ radial pulses   Pulmonary/Chest: Effort normal and breath sounds normal. No respiratory distress. She has no wheezes. She has no rales. She exhibits no tenderness.   Speaking in full sentences   Abdominal: Soft. She exhibits no distension. There is no abdominal tenderness.   No cva ttp. Gravid nontender uterus.    Musculoskeletal: Normal range of motion.         General: No deformity.   Neurological: She is alert and oriented to person, place, and time. Coordination normal.   Sensation intact to light touch   Skin: Skin is warm and dry. No rash noted. She is not diaphoretic.   Psychiatric: She has a normal mood and affect.       Diagnostic Study Results     Labs -  Results     Procedure Component Value Units Date/Time    Troponin I [[id number]] Collected: 05/26/19 1624    Specimen: Blood  Updated: 05/26/19 1659     Troponin I 0.01 ng/mL     Narrative:      Replace urinary catheter prior to obtaining the urine culture  if it has been in place for greater than or equal to 14  days:->Yes  Indications for U/A Reflex to Micro - Reflex to  Culture:->Suprapubic Pain/Tenderness or Dysuria    Comprehensive metabolic panel [[id number]]  (Abnormal) Collected: 05/26/19 1624    Specimen: Blood Updated: 05/26/19 1657     Glucose 102 mg/dL      BUN 5.0 mg/dL      Creatinine 0.6 mg/dL      Sodium 025 mEq/L      Potassium 3.6 mEq/L      Chloride 105 mEq/L      CO2 22 mEq/L      Calcium 8.7 mg/dL      Protein, Total 6.3 g/dL      Albumin 2.9 g/dL      AST (SGOT) 15 U/L      ALT 21 U/L      Alkaline Phosphatase 63 U/L      Bilirubin, Total 0.2 mg/dL      Globulin 3.4 g/dL      Albumin/Globulin Ratio 0.9     Anion Gap 10.0    Narrative:      Replace urinary catheter prior to obtaining the urine culture  if it has been in place for greater than or equal to 14  days:->Yes  Indications for U/A Reflex to Micro - Reflex to  Culture:->Suprapubic Pain/Tenderness or Dysuria    GFR [[id number]] Collected: 05/26/19 1624     Updated: 05/26/19 1657     EGFR >60.0    Narrative:      Replace urinary catheter prior to obtaining the urine culture  if it has been in place for greater than or equal to 14  days:->Yes  Indications for U/A Reflex to Micro - Reflex to  Culture:->Suprapubic Pain/Tenderness or Dysuria    UA Reflex to Micro - Reflex to Culture [[id number]]  (Abnormal) Collected: 05/26/19 1615     Updated: 05/26/19 1640     Urine Type UCC     Color, UA Straw     Clarity, UA Clear     Specific Gravity UA 1.015     Urine pH 7.0     Leukocyte Esterase, UA Small     Nitrite, UA Negative     Protein, UR Negative     Glucose, UA Negative     Ketones UA Negative     Urobilinogen, UA 0.2 mg/dL      Bilirubin, UA Negative     Blood, UA Negative     RBC, UA 0-2 /hpf      WBC, UA 0-5 /hpf      Squamous Epithelial Cells, Urine 0-5 /hpf      Narrative:      Replace urinary catheter prior to obtaining the urine culture  if it has been in place for greater than or equal to 14  days:->Yes  Indications for U/A Reflex to Micro - Reflex to  Culture:->Suprapubic Pain/Tenderness or Dysuria    CBC and differential [[id number]]  (Abnormal) Collected: 05/26/19 1624    Specimen: Blood Updated: 05/26/19 1640     WBC 9.64 x10 3/uL      Hgb 10.0 g/dL  Hematocrit 31.1 %      Platelets 333 x10 3/uL      RBC 3.70 x10 6/uL      MCV 84.1 fL      MCH 27.0 pg      MCHC 32.2 g/dL      RDW 14 %      MPV 8.9 fL      Neutrophils 74.8 %      Lymphocytes Automated 16.1 %      Monocytes 6.8 %      Eosinophils Automated 1.3 %      Basophils Automated 0.3 %      Immature Granulocytes 0.7 %      Nucleated RBC 0.0 /100 WBC      Neutrophils Absolute 7.20 x10 3/uL      Lymphocytes Absolute Automated 1.55 x10 3/uL      Monocytes Absolute Automated 0.66 x10 3/uL      Eosinophils Absolute Automated 0.13 x10 3/uL      Basophils Absolute Automated 0.03 x10 3/uL      Immature Granulocytes Absolute 0.07 x10 3/uL      Absolute NRBC 0.00 x10 3/uL     Narrative:      Replace urinary catheter prior to obtaining the urine culture  if it has been in place for greater than or equal to 14  days:->Yes  Indications for U/A Reflex to Micro - Reflex to  Culture:->Suprapubic Pain/Tenderness or Dysuria          Radiologic Studies -   Radiology Results (24 Hour)     Procedure Component Value Units Date/Time    US OB Limited > 14 weeks [[id number]] Collected: 05/26/19 1746    Order Status: Completed Updated: 05/26/19 1751    Narrative:      US OB LTD 1 OR MORE: 05/26/2019 5:00 PM    CLINICAL INFORMATION  22 w pregnant lower abd cramping/back pain.    COMPARISON  None.    FINDINGS  There is a single living intrauterine fetus in the transverse  position.      Cervical length: 4.8 cm.    Placental location: Posterior.    Placental margin: The placental margin is clear of the internal cervical  os.    Fetal heart  rate: 146 beats per minute.    FETAL BIOMETRY  BPD: 5.4 cm = EGA: 22 weeks 3 days  HC: 21.2 cm = EGA: 23 weeks 2 days  AC: 19.9 cm = EGA: 24 weeks 4 days  FL: 4.3 cm = EGA: 24 weeks 0 days  EFW: 656 grams (97%)  AFI: Subjectively normal    Gestational age based on LMP: 22 weeks 4 days with EDD: 09/25/2019  Estimated gestational age (AUA): 23 weeks 4 days with EDD: 09/18/2019      Impression:        1. Intrauterine gestation measuring 23 weeks 4 days with EFW 656 g  (97%).                Maylon Cos   05/26/2019 5:49 PM      .    Medical Decision Making   I am the first provider for this patient.    I reviewed the vital signs, available nursing notes, past medical history, past surgical history, family history and social history.    Vital Signs-Reviewed the patient's vital signs.     Patient Vitals for the past 12 hrs:   BP Temp Pulse Resp   05/26/19 1802 106/60 -- 87 --  05/26/19 1745 113/65 -- 87 --   05/26/19 1632 133/68 -- 93 16   05/26/19 1617 94/52 -- 83 --   05/26/19 1531 107/64 98.1 F (36.7 C) 95 17       Pulse Oximetry Analysis - Normal SpO2: 98 % on RA    EKG:  Interpreted by the EP.   Time Interpreted: 1607   Rate: 83   Rhythm: Normal Sinus Rhythm 83   Interpretation:nl intervals, no ectopy, no stemi   Comparison: 10/19/17  Old Medical Records: Old medical records.       ED Medications  Medications   sodium chloride 0.9 % bolus 2,000 mL (0 mLs Intravenous Stopped 05/26/19 1833)       ED Course:   ED Course as of May 25 2242   Sat May 26, 2019   1804 D/w Dr. Vaughan Basta, obgyn on call for the clinic- discussed results. She is familiar with the patient's care. No indication for further monitoring today based on presentation    [JG]      ED Course User Index  [JG] Volanda Napoleon, PA       Patient was not tested for COVID 19. (if tested, D-dimer, ferritin, CRP, EKG, contact/droplet isolation orders).    Provider Notes:     Patient feels much improved on discharge, no lightheadedness or shortness of breath.  Do  not suspect ACS, PE. She reports that the cramps seems to have abated as well.  She feels very much relieved seeing her fetus move on the ultrasound and is happy to go home today.  We discussed plan for follow-up with OB/GYN, as well as low threshold for return to [facility] for monitoring at L&D, as she understands that this ultrasound is not the same as continuous monitoring.     The patient/guardian voiced understanding of the return precautions.. Discussed most common side effects of medications given. Solicited, welcomed and answered all patient/guardian questions.    D/w Maryella Shivers, MD      Diagnosis     Clinical Impression:   1. Abdominal cramping    2. Intrauterine pregnancy        Treatment Plan:   ED Disposition     ED Disposition Condition Date/Time Comment    Discharge  Sat May 26, 2019  6:27 PM Ercie Plank discharge to home/self care.    Condition at disposition: Stable            _______________________________    CHART OWNERSHIPAnastasia Pall, PA-C, am the primary clinician of record.  _______________________________       Volanda Napoleon, PA  05/26/19 2245       Maryella Shivers, MD  05/29/19 [phone]

## 2019-05-27 NOTE — Progress Notes (Signed)
Normal urogenital or skin microbia. No further work.

## 2019-05-29 LAB — ECG 12-LEAD
Atrial Rate: 83 {beats}/min
P Axis: 26 degrees
P-R Interval: 160 ms
Q-T Interval: 352 ms
QRS Duration: 76 ms
QTC Calculation (Bezet): 413 ms
R Axis: 19 degrees
T Axis: 3 degrees
Ventricular Rate: 83 {beats}/min

## 2019-07-18 ENCOUNTER — Telehealth (INDEPENDENT_AMBULATORY_CARE_PROVIDER_SITE_OTHER): Payer: Self-pay | Admitting: Family Medicine

## 2019-07-18 NOTE — Telephone Encounter (Signed)
Needs to bring in form and have appt

## 2019-07-18 NOTE — Telephone Encounter (Signed)
Pt's dmv parking placard expires tomorrow. She is asking if another form can be completed for her to pick up tomorrow?

## 2019-07-19 NOTE — Telephone Encounter (Signed)
Pt called looking for parking pass - Told her per message, she needed to bring in form and make an appointment - Pt says we have tjhe forms here and Dr always gives her a temporary pass versus a permanent pass - pass is expiring today -     please call to advise    New #[phone]

## 2019-07-20 ENCOUNTER — Encounter (INDEPENDENT_AMBULATORY_CARE_PROVIDER_SITE_OTHER): Payer: Self-pay

## 2019-07-20 NOTE — Telephone Encounter (Signed)
Informed patient DMV form ready to pick up. 6 months Placard provided until 01/18/2020. Need to schedule an appointment next time. Patient verbalized and understanding.

## 2019-08-22 ENCOUNTER — Encounter (HOSPITAL_BASED_OUTPATIENT_CLINIC_OR_DEPARTMENT_OTHER): Payer: Self-pay

## 2019-08-22 ENCOUNTER — Emergency Department
Admission: EM | Admit: 2019-08-22 | Discharge: 2019-08-22 | Disposition: A | Discharge status: Home / Self Care | Payer: No Typology Code available for payment source | Attending: Emergency Medicine | Admitting: Emergency Medicine

## 2019-08-22 DIAGNOSIS — O3463 Maternal care for abnormality of vagina, third trimester: Secondary | ICD-10-CM | POA: Insufficient documentation

## 2019-08-22 DIAGNOSIS — N898 Other specified noninflammatory disorders of vagina: Secondary | ICD-10-CM

## 2019-08-22 DIAGNOSIS — E785 Hyperlipidemia, unspecified: Secondary | ICD-10-CM | POA: Insufficient documentation

## 2019-08-22 DIAGNOSIS — O99283 Endocrine, nutritional and metabolic diseases complicating pregnancy, third trimester: Secondary | ICD-10-CM | POA: Insufficient documentation

## 2019-08-22 DIAGNOSIS — O26893 Other specified pregnancy related conditions, third trimester: Secondary | ICD-10-CM

## 2019-08-22 DIAGNOSIS — Z3A35 35 weeks gestation of pregnancy: Secondary | ICD-10-CM | POA: Insufficient documentation

## 2019-08-22 LAB — RPR
RPR: NONREACTIVE
RPR: NONREACTIVE

## 2019-08-22 LAB — RUBELLA ANTIBODY, IGG: Rubella AB, IgG: IMMUNE

## 2019-08-22 LAB — SYPHILIS SCREEN IGG AND IGM: Syphilis Screen IgG and IgM: NONREACTIVE

## 2019-08-22 LAB — HAS ZIKA TESTING BEEN PERFORMED

## 2019-08-22 NOTE — ED Notes (Signed)
No evidence of SROm per Dr. Dawayne Patricia exam.  Patient was given routine precautions and discharged home ambulatory.  She will follow up as needed with her OB.

## 2019-08-22 NOTE — ED Notes (Signed)
Patient here c/o leaking fluid around 7pm. She states she was seen by Dr. Lavone Nian at 0800 and he checked her cervix and told her she was 1cm dilated.  She reports she felt the fluid leak at 7pm and it has only happened once.  She has history of 36 week delivery and multiple miscarriages. She  is taking Makena weekly.

## 2019-08-22 NOTE — OB ED Provider Note (Signed)
OBSTETRICAL EMERGENCY DEPARTMENT EVALUATION    Date/Time: 08/22/19, 9:50 PM  Patient Name: Judy Aguirre  Attending: No att. providers found    CC: Leakage of fluid    History of Present Illness:   Judy Aguirre is a 39 y.o. [id number] at [redacted]w[redacted]d, EDD 09/25/2019, Date entered prior to episode creation presenting to OBED with small amount of leakage of fluid vaginally. Denies VB, CTX, HA, vision changes, CP, SOB, N/V, RUQ/epigastric pain. Endorses good fetal movement.    Pregnancy complicated by:  Patient Active Problem List   Diagnosis    High grade squamous intraepithelial cervical dysplasia    History of preterm delivery    HSV-2 seropositive    Cystic hygroma    Breast lump    Abnormal findings on diagnostic imaging of breast    Family history of malignant neoplasm of breast    Vaginal discharge during pregnancy in third trimester       Obstetrical History:   [id number]  OB History   Gravida Para Term Preterm AB Living   7 1 1   5 1    SAB TAB Ectopic Multiple Live Births   3 2   0        # Outcome Date GA Lbr Len/2nd Weight Sex Delivery Anes PTL Lv   7 Current            6 TAB            5 SAB            4 TAB            3 SAB            2 SAB            1 Term                Gynecological History:   Patient's last menstrual period was 12/19/2018 (exact date).  Denies h/o abnormal PAP or STIs    Past Medical History:     Past Medical History:   Diagnosis Date    Anemia     no meds    Breast disorder 2019    benign lump left breast    Breast lump 10/27/2017    Hyperlipidemia     Seasonal allergic rhinitis        Past Surgical History:     Past Surgical History:   Procedure Laterality Date    D & E, SUCTION N/A 09/10/2016    Procedure: D & E, SUCTION;  Surgeon: Erenest Blank, MD;  Location: [facility];  Service: Gynecology;  Laterality: N/A;    DILATION AND CURETTAGE OF UTERUS      INDUCED ABORTION      x2    LEEP LLETZ  05/21/2013    Procedure: LEEP LLETZ;  Surgeon: Jeanie Cooks, MD;  Location: [facility];  Service: Gynecology;  Laterality: N/A;    VAGINAL DELIVERY  2000       Medications:     Discharge Medication List as of 08/22/2019  8:23 PM      CONTINUE these medications which have NOT CHANGED    Details   HYDROXYprogesterone Caproate (MAKENA IM) Inject into the muscle, Historical Med      Prenatal MV-Min-Fe Fum-FA-DHA (PRENATAL 1 PO) Take by mouth, Historical Med              Allergies:     Allergies   Allergen Reactions    Penicillins  Other (See Comments) and Rash     Has patient had a PCN reaction causing immediate rash, facial/tongue/throat swelling, SOB or lightheadedness with hypotension: Yes  Has patient had a PCN reaction causing severe rash involving mucus membranes or skin necrosis: No  Has patient had a PCN reaction that required hospitalization: Yes  Has patient had a PCN reaction occurring within the last 10 years: Yes  If all of the above answers are "NO", then may proceed with Cephalosporin use.  Was told by parent she was allergic as child       Social History:     Social History     Socioeconomic History    Marital status: Single     Spouse name: Not on file    Number of children: Not on file    Years of education: Not on file    Highest education level: Not on file   Occupational History    Not on file   Tobacco Use    Smoking status: Former Smoker     Years: 3.00    Smokeless tobacco: Former Neurosurgeon    Tobacco comment: social    Substance and Sexual Activity    Alcohol use: Never    Drug use: No    Sexual activity: Not Currently     Partners: Male   Other Topics Concern    Not on file   Social History Narrative    Not on file     Social Determinants of Health     Financial Resource Strain:     Difficulty of Paying Living Expenses:    Food Insecurity:     Worried About Programme researcher, broadcasting/film/video in the Last Year:     Barista in the Last Year:    Transportation Needs:     Freight forwarder (Medical):     Lack of Transportation  (Non-Medical):    Physical Activity:     Days of Exercise per Week:     Minutes of Exercise per Session:    Stress:     Feeling of Stress :    Social Connections:     Frequency of Communication with Friends and Family:     Frequency of Social Gatherings with Friends and Family:     Attends Religious Services:     Active Member of Clubs or Organizations:     Attends Engineer, structural:     Marital Status:    Intimate Partner Violence:     Fear of Current or Ex-Partner:     Emotionally Abused:     Physically Abused:     Sexually Abused:        Family History:     Family History   Problem Relation Age of Onset    Breast cancer Mother 64    Cancer Mother     No known problems Father     Breast cancer Paternal Aunt 1        Paternal half-aunt    Cancer Paternal Aunt     Breast cancer Cousin 41        Paternal half-cousin; Pt reports genetic testing was negative, report not reviewed    No known problems Son     Ovarian cancer Neg Hx        Review of Systems:  Pertinent system review noted in HPI.  All other systems are negative    Physical Exam:   BP 116/72    Pulse 85  Temp 98.3 F (36.8 C) (Oral)    Resp 18    Ht 5\' 7"  (1.702 m)    Wt 87.5 kg    LMP 12/19/2018 (Exact Date)    SpO2 99%    BMI 30.23 kg/m     EFM: 140 bpm/moderate variability/+accelerations/no decelerations  Toco: none    Gen: NAD, AOx3  Back: no CVAT  Abd: Gravid, NT  LE: LE edema none, no evidence of DVT  Pelvic:  Speculum Exam: negative for pool/nit/fern       Labs & Imaging:   Labs  Results     Procedure Component Value Units Date/Time    Has Zika Testing Been Performed [[id number]] Resulted: 08/22/19     Updated: 08/22/19 1954     ZIKA TESTING DONE Not Indicated          Imaging  No results found.      Prenatal Records Reviewed    Assessment & Plan:   39 y.o. [id number] at [redacted]w[redacted]d presenting with vaginal discharge, negative for SROM  Reassuring fetal testing  Discharge home. Labor precautions    Enid Baas, MD  OB  Hospitalist Group  [facility]

## 2019-08-28 LAB — GROUP B STREP TRANSCRIBED: GBS Transcribed: POSITIVE

## 2019-09-07 ENCOUNTER — Encounter: Payer: Self-pay | Admitting: Obstetrics & Gynecology

## 2019-09-09 ENCOUNTER — Encounter (HOSPITAL_BASED_OUTPATIENT_CLINIC_OR_DEPARTMENT_OTHER): Payer: Self-pay

## 2019-09-09 ENCOUNTER — Encounter: Payer: Self-pay | Admitting: Anesthesiology

## 2019-09-09 ENCOUNTER — Inpatient Hospital Stay
Admission: EM | Admit: 2019-09-09 | Discharge: 2019-09-11 | DRG: 806 | Disposition: A | Discharge status: Home / Self Care | Payer: No Typology Code available for payment source | Attending: Obstetrics and Gynecology | Admitting: Obstetrics and Gynecology

## 2019-09-09 DIAGNOSIS — Z20822 Contact with and (suspected) exposure to covid-19: Secondary | ICD-10-CM | POA: Diagnosis present

## 2019-09-09 DIAGNOSIS — Z3A37 37 weeks gestation of pregnancy: Secondary | ICD-10-CM

## 2019-09-09 DIAGNOSIS — O09523 Supervision of elderly multigravida, third trimester: Secondary | ICD-10-CM

## 2019-09-09 DIAGNOSIS — Z87891 Personal history of nicotine dependence: Secondary | ICD-10-CM

## 2019-09-09 DIAGNOSIS — O9832 Other infections with a predominantly sexual mode of transmission complicating childbirth: Secondary | ICD-10-CM | POA: Diagnosis present

## 2019-09-09 DIAGNOSIS — R768 Other specified abnormal immunological findings in serum: Secondary | ICD-10-CM | POA: Diagnosis present

## 2019-09-09 DIAGNOSIS — O479 False labor, unspecified: Secondary | ICD-10-CM | POA: Diagnosis present

## 2019-09-09 DIAGNOSIS — Z880 Allergy status to penicillin: Secondary | ICD-10-CM

## 2019-09-09 DIAGNOSIS — Z8759 Personal history of other complications of pregnancy, childbirth and the puerperium: Secondary | ICD-10-CM

## 2019-09-09 DIAGNOSIS — A6009 Herpesviral infection of other urogenital tract: Secondary | ICD-10-CM | POA: Diagnosis present

## 2019-09-09 DIAGNOSIS — O99824 Streptococcus B carrier state complicating childbirth: Principal | ICD-10-CM | POA: Diagnosis present

## 2019-09-09 LAB — TYPE AND SCREEN
AB Screen Gel: NEGATIVE
ABO Rh: B POS

## 2019-09-09 LAB — CBC AND DIFFERENTIAL
Absolute NRBC: 0 10*3/uL (ref 0.00–0.00)
Basophils Absolute Automated: 0.03 10*3/uL (ref 0.00–0.08)
Basophils Automated: 0.3 %
Eosinophils Absolute Automated: 0.07 10*3/uL (ref 0.00–0.44)
Eosinophils Automated: 0.7 %
Hematocrit: 41.2 % (ref 34.7–43.7)
Hgb: 13.7 g/dL (ref 11.4–14.8)
Immature Granulocytes Absolute: 0.05 10*3/uL (ref 0.00–0.07)
Immature Granulocytes: 0.5 %
Lymphocytes Absolute Automated: 1.62 10*3/uL (ref 0.42–3.22)
Lymphocytes Automated: 15.4 %
MCH: 29.3 pg (ref 25.1–33.5)
MCHC: 33.3 g/dL (ref 31.5–35.8)
MCV: 88 fL (ref 78.0–96.0)
MPV: 9.8 fL (ref 8.9–12.5)
Monocytes Absolute Automated: 0.65 10*3/uL (ref 0.21–0.85)
Monocytes: 6.2 %
Neutrophils Absolute: 8.12 10*3/uL — ABNORMAL HIGH (ref 1.10–6.33)
Neutrophils: 76.9 %
Nucleated RBC: 0 /100 WBC (ref 0.0–0.0)
Platelets: 278 10*3/uL (ref 142–346)
RBC: 4.68 10*6/uL (ref 3.90–5.10)
RDW: 18 % — ABNORMAL HIGH (ref 11–15)
WBC: 10.54 10*3/uL — ABNORMAL HIGH (ref 3.10–9.50)

## 2019-09-09 LAB — COVID-19 (SARS-COV-2): SARS CoV 2 Overall Result: NEGATIVE

## 2019-09-09 MED ORDER — ACETAMINOPHEN 650 MG RE SUPP
650.0000 mg | RECTAL | Status: DC | PRN
Start: 2019-09-09 — End: 2019-09-10

## 2019-09-09 MED ORDER — EPHEDRINE SULFATE 50 MG/ML IJ/IV SOLN (WRAP)
10.0000 mg | Freq: Once | Status: DC | PRN
Start: 2019-09-09 — End: 2019-09-10

## 2019-09-09 MED ORDER — METOCLOPRAMIDE HCL 5 MG/ML IJ SOLN
10.0000 mg | Freq: Once | INTRAMUSCULAR | Status: DC | PRN
Start: 2019-09-09 — End: 2019-09-10

## 2019-09-09 MED ORDER — NALOXONE HCL 0.4 MG/ML IJ SOLN (WRAP)
0.1000 mg | INTRAMUSCULAR | Status: DC | PRN
Start: 2019-09-09 — End: 2019-09-10

## 2019-09-09 MED ORDER — LACTATED RINGERS IV SOLN
INTRAVENOUS | Status: DC
Start: 2019-09-09 — End: 2019-09-10

## 2019-09-09 MED ORDER — VANCOMYCIN HCL IN NACL 1.75-0.9 GM/500ML-% IV SOLN
20.0000 mg/kg | Freq: Once | INTRAVENOUS | Status: AC
Start: 2019-09-09 — End: 2019-09-10
  Administered 2019-09-09: 1750 mg via INTRAVENOUS
  Filled 2019-09-09: qty 500

## 2019-09-09 MED ORDER — CLINDAMYCIN PHOSPHATE IN D5W 600 MG/50ML IV SOLN
600.0000 mg | Freq: Three times a day (TID) | INTRAVENOUS | Status: DC
Start: 2019-09-09 — End: 2019-09-10
  Administered 2019-09-09: 22:00:00 600 mg via INTRAVENOUS
  Filled 2019-09-09: qty 50

## 2019-09-09 MED ORDER — FENTANYL-BUPIVACAINE-NACL 0.2-0.125-0.9 MG/100ML-% EP SOLN
EPIDURAL | Status: DC
Start: 2019-09-09 — End: 2019-09-10
  Filled 2019-09-09: qty 100

## 2019-09-09 MED ORDER — ACETAMINOPHEN 325 MG PO TABS
650.0000 mg | ORAL_TABLET | ORAL | Status: DC | PRN
Start: 2019-09-09 — End: 2019-09-10

## 2019-09-09 MED ORDER — NALOXONE HCL 0.4 MG/ML IJ SOLN (WRAP)
0.2000 mg | INTRAMUSCULAR | Status: DC | PRN
Start: 2019-09-09 — End: 2019-09-10

## 2019-09-09 MED ORDER — TERBUTALINE SULFATE 1 MG/ML IJ SOLN
0.2500 mg | Freq: Once | INTRAMUSCULAR | Status: DC | PRN
Start: 2019-09-09 — End: 2019-09-10

## 2019-09-09 MED ORDER — LACTATED RINGERS IV BOLUS
1000.0000 mL | Freq: Once | INTRAVENOUS | Status: AC
Start: 2019-09-09 — End: 2019-09-09
  Administered 2019-09-09: 20:00:00 1000 mL via INTRAVENOUS

## 2019-09-09 MED ORDER — LIDOCAINE HCL (PF) 1 % IJ SOLN
10.0000 mL | INTRAMUSCULAR | Status: DC
Start: 2019-09-09 — End: 2019-09-10

## 2019-09-09 MED ORDER — FAMOTIDINE 10 MG/ML IV SOLN (WRAP)
20.0000 mg | Freq: Once | INTRAVENOUS | Status: AC | PRN
Start: 2019-09-09 — End: 2019-09-10
  Administered 2019-09-10: 02:00:00 20 mg via INTRAVENOUS
  Filled 2019-09-09: qty 2

## 2019-09-09 MED ORDER — BUPIVACAINE HCL (PF) 0.25 % IJ SOLN
30.0000 mL | Freq: Once | INTRAMUSCULAR | Status: DC
Start: 2019-09-09 — End: 2019-09-10

## 2019-09-09 MED ORDER — LACTATED RINGERS IV SOLN
125.00 mL/h | INTRAVENOUS | Status: DC
Start: 2019-09-09 — End: 2019-09-10
  Administered 2019-09-09: 21:00:00 125 mL/h via INTRAVENOUS

## 2019-09-09 MED ORDER — SOD CITRATE-CITRIC ACID 500-334 MG/5ML PO SOLN
30.0000 mL | Freq: Once | ORAL | Status: DC | PRN
Start: 2019-09-09 — End: 2019-09-10

## 2019-09-09 MED ORDER — FENTANYL CITRATE (PF) 50 MCG/ML IJ SOLN (WRAP)
100.0000 ug | Freq: Once | INTRAMUSCULAR | Status: DC
Start: 2019-09-09 — End: 2019-09-10
  Filled 2019-09-09: qty 2

## 2019-09-09 NOTE — H&P (Signed)
OB LABOR AND DELIVERY ADMISSION H&P    Date Time: 09/08/2108:11 PM  Room#W113/W113.01      Chief Complaint   Patient presents with    Contractions       Subjective:  Judy Aguirre is a 39 y.o.  [phone]  female Estimated Date of Delivery: 09/25/19 at [redacted]w[redacted]d gestation who presents to the hospital for labor management.  Her current obstetrical history is significant for elderly multigravida, HSV-2 seropostive..  Patient reports contractions since earlier today.      Patient denies current vaginal/vulvar lesion or prodromal symptoms. She has been taking Valtrex prophylaxis. Reports good fetal movement. No ROM.    Significant Medical History:    Allergies   Allergen Reactions    Penicillins Other (See Comments) and Rash     Has patient had a PCN reaction causing immediate rash, facial/tongue/throat swelling, SOB or lightheadedness with hypotension: Yes  Has patient had a PCN reaction causing severe rash involving mucus membranes or skin necrosis: No  Has patient had a PCN reaction that required hospitalization: Yes  Has patient had a PCN reaction occurring within the last 10 years: Yes  If all of the above answers are "NO", then may proceed with Cephalosporin use.  Was told by parent she was allergic as child       Medications Prior to Admission   Medication Sig Dispense Refill Last Dose    Prenatal MV-Min-Fe Fum-FA-DHA (PRENATAL 1 PO) Take by mouth   09/09/2019 at Unknown time    HYDROXYprogesterone Caproate (MAKENA IM) Inject into the muscle   More than a month at Unknown time       OB History     Gravida   7    Para   1    Term   1    Preterm        AB   5    Living   1       SAB   3    TAB   2    Ectopic        Multiple   0    Live Births                     Past Medical History:   Diagnosis Date    Anemia     iron supplement during third trimester    Breast disorder 2019    benign lump left breast    Breast lump 10/27/2017    Hyperlipidemia     Seasonal allergic rhinitis        Past Surgical  History:   Procedure Laterality Date    D & E, SUCTION N/A 09/10/2016    Procedure: D & E, SUCTION;  Surgeon: Erenest Blank, MD;  Location: [facility];  Service: Gynecology;  Laterality: N/A;    DILATION AND CURETTAGE OF UTERUS      INDUCED ABORTION      x2    LEEP LLETZ  05/21/2013    Procedure: LEEP LLETZ;  Surgeon: Jeanie Cooks, MD;  Location: [facility];  Service: Gynecology;  Laterality: N/A;    VAGINAL DELIVERY  2000       Family History   Problem Relation Age of Onset    Breast cancer Mother 2    Cancer Mother     No known problems Father     Breast cancer Paternal Aunt 33        Paternal half-aunt    Cancer  Paternal Aunt     Breast cancer Cousin 59        Paternal half-cousin; Pt reports genetic testing was negative, report not reviewed    No known problems Son     Ovarian cancer Neg Hx        Social History     Socioeconomic History    Marital status: Single     Spouse name: Not on file    Number of children: Not on file    Years of education: Not on file    Highest education level: Not on file   Occupational History    Not on file   Tobacco Use    Smoking status: Former Smoker     Years: 3.00    Smokeless tobacco: Former Neurosurgeon    Tobacco comment: social    Substance and Sexual Activity    Alcohol use: Never    Drug use: No    Sexual activity: Not Currently     Partners: Male   Other Topics Concern    Not on file   Social History Narrative    Not on file     Social Determinants of Health     Financial Resource Strain:     Difficulty of Paying Living Expenses:    Food Insecurity:     Worried About Programme researcher, broadcasting/film/video in the Last Year:     Barista in the Last Year:    Transportation Needs:     Freight forwarder (Medical):     Lack of Transportation (Non-Medical):    Physical Activity:     Days of Exercise per Week:     Minutes of Exercise per Session:    Stress:     Feeling of Stress :    Social Connections:     Frequency of Communication with  Friends and Family:     Frequency of Social Gatherings with Friends and Family:     Attends Religious Services:     Active Member of Clubs or Organizations:     Attends Banker Meetings:     Marital Status:    Intimate Partner Violence:     Fear of Current or Ex-Partner:     Emotionally Abused:     Physically Abused:     Sexually Abused:            Objective:    Vital Signs:  Vitals:    09/09/19 2131   BP: 110/62   Pulse: 80   Resp: 18   Temp:    SpO2:          Physical Exam:     General:   alert, appears stated age and cooperative   Skin:   normal   HEENT:  thyroid without masses   Lungs:   clear to auscultation bilaterally   Heart:   regular rate and rhythm, S1, S2 normal, no murmur, click, rub or gallop   Abdomen:  soft, non-tender; bowel sounds normal; gravid uterus, no other masses,  no organomegaly.   FHT:  Baseline Rate: 140 BPM   Uterine Size: size equals dates   Presentations: cephalic   Cervix:     Dilation: 5    Effacement: 60    Station:  -3    Consistency: medium    Position: middle             Labs:     Prenatal Results     1st Trimester  Test Value Reference Range Date Time    Type & Rh B POS   09/09/19 2012    Antibody Screen        Beta HCG Quantitative 84 mIU/mL  12/17/15 1537    HCT        HGB        PLT        Syphilis Screen, IgG, IgM        RPR ([facility])        Rubella        HepB Ag        HepC Ab        HIV        HIV ([facility])        HIV ([facility])        TSH        T3        T4        Toxoplasma IGG        Toxoplasma IGM        Urine Culture        GC/Clamydia Urine        Gonorrhea        Chlamydia        Gonorrhea ([facility])        Chlamydia ([facility])        Gonorrhea ([facility])        Chlamydia ([facility])        TB Quantiferon        TB        Pap, ThinPrep w image analysis        Pap, ThinPrep w image analysis & HPV        Pap, ThinPrep w image analysis, reflex HPV        Pap, Thin Prep w reflex HPV, CT/NG        HPV DNA, High Risk        Pap        Nuchal  Translucency        Nuchal Translucency (Transcribed)              2nd Trimester     Test Value Reference Range Date Time    MSAFP        Triple Screen        Quad Screen        Quad Screen M        HCT 31.1 % 34.7 - 43.7 05/26/19 1624    HGB 10.0 g/dL 16.1 - 09.6 04/54/09 8119    PLT 333 x10 3/uL 142 - 346 05/26/19 1624    Syphilis Screen, IgG, IgM        RPR ([facility])        Antibody Screen        Urine Culture  (See Report)    05/26/19 1624    Fasting Glucose        1 HR GTT        2 HR GTT        3 HR GTT        3 HR GTT (Transcribed)              3rd Trimester     Test Value Reference Range Date Time    HCT 41.2 % 34.7 - 43.7 09/09/19 2012    HGB 13.7 g/dL 14.7 - 82.9 56/21/30 8657    PLT 278 x10 3/uL 142 - 346 09/09/19 2012    Syphilis Screen, IgG,  IgM        RPR ([facility])        1 HR Glucose Challenge        Urine Culture        Chlamydia/GC  (See Report)    07/28/15 1614    Chld/GC PCR ([facility])        GBS        GBS (Transcribed) Positive   08/28/19           Genetic Screening     Test Value Reference Range Date Time    First Trimester Screen w/Nuchal        Nuchal Transcribed        Cystic Fibrosis        Thalassemia        Thalassemia ([facility])        Sickle Cell        HGB Evaluation        HGB Electrophoresis        Tay-Sachs        Ashkenazi Jewish Panel ([facility])        MaterniT21        [facility]        Amnio        MSAFP        Quad Screen        Canavan        Counsyl Test        Cell Free DNA Screen, [facility]        SMA Screen, [facility]        CF Screen, [facility]              Communicable Disease     Test Value Reference Range Date Time    Was Zika testing done? Not Indicated   08/22/19     Zika test method        Zika test result              1 Hour Glucose Tolerance Test     Test Value Reference Range Date Time    Glucose Tolerance Test              3 Hour Glucose Tolerance Test     Test Value Reference Range Date Time    Fasting        1 hour        2 hour        3 hour              Other Orders      Test Value Reference Range Date Time    Hemoglobin A1c 5.3 % 4.6 - 5.9 10/31/18 1044    Hepatitis C Antibody        Toxoplasma Gondi IgG        Varicella-Zoster Antibody, IgG        Varicella-Zoster Antibody, IgM        CMV IgG        CMV IgM        Parvo IgG ([facility])        Parvo IgM ([facility])                     View all results for this pregnancy                             Assessment:  Patient is a 39 y.o., [id number]  [redacted]w[redacted]d in Active phase labor.  Problem List:  Principal Problem:    Uterine contractions during pregnancy  Active Problems:    HSV-2 seropositive    Elderly multigravida, third trimester    [redacted] weeks gestation of pregnancy      Plan:  1. Admit for delivery. Expect VD.  2. GBS prophylaxis.  3. Labs as ordered.  4. Written consent obtained.    Risks, benefits, alternatives and possible complications have been discussed in detail with the patient.  Pre-admission, admission, and post admission procedures and expectations were discussed in detail.  All questions answered, all appropriate consents signed.     Sinclair Grooms, MD, FACOG  [phone]

## 2019-09-09 NOTE — Progress Notes (Signed)
GBS positive.  PCN allergy. GBS resisitant to clindamycin.    Will treat with Vancomycin.    Sinclair Grooms, MD, FACOG  [phone]

## 2019-09-09 NOTE — OB ED Provider Note (Signed)
OBSTETRICAL EMERGENCY DEPARTMENT EVALUATION    Date/Time: 09/09/19, 7:39 PM  Patient Name: Judy Aguirre  Attending: Providence Lanius, MD    CC: Painful contractions    History of Present Illness:   Judy Aguirre is a 39 y.o. Z6X0960 at [redacted]w[redacted]d, EDD 09/25/2019, Date entered prior to episode creation presenting to triage with painful contractions every 3-4 minutes.     Endorses good fetal movement.   Denies VB, LOF, HA, vision changes, CP, SOB, N/V, RUQ/epigastric pain.   Denies any fever, cough, SOB for herself or anyone in her household.     PNC with [facility]      Pregnancy complicated by:  Patient Active Problem List   Diagnosis    High grade squamous intraepithelial cervical dysplasia    History of preterm delivery    HSV-2 seropositive    Cystic hygroma    Breast lump    Abnormal findings on diagnostic imaging of breast    Family history of malignant neoplasm of breast    Vaginal discharge during pregnancy in third trimester       Obstetrical History:   G7P1051  OB History   Gravida Para Term Preterm AB Living   7 1 1   5 1    SAB TAB Ectopic Multiple Live Births   3 2   0        # Outcome Date GA Lbr Len/2nd Weight Sex Delivery Anes PTL Lv   7 Current            6 TAB            5 SAB            4 TAB            3 SAB            2 SAB            1 Term                Gynecological History:   Denies h/o abnormal PAP or STIs    Past Medical History:     Past Medical History:   Diagnosis Date    Anemia     iron supplement during third trimester    Breast disorder 2019    benign lump left breast    Breast lump 10/27/2017    Hyperlipidemia     Seasonal allergic rhinitis        Past Surgical History:     Past Surgical History:   Procedure Laterality Date    D & E, SUCTION N/A 09/10/2016    Procedure: D & E, SUCTION;  Surgeon: Erenest Blank, MD;  Location: [facility];  Service: Gynecology;  Laterality: N/A;    DILATION AND CURETTAGE OF UTERUS      INDUCED ABORTION      x2     LEEP LLETZ  05/21/2013    Procedure: LEEP LLETZ;  Surgeon: Jeanie Cooks, MD;  Location: [facility];  Service: Gynecology;  Laterality: N/A;    VAGINAL DELIVERY  2000       Medications:     Current/Home Medications    HYDROXYPROGESTERONE CAPROATE (MAKENA IM)    Inject into the muscle    PRENATAL MV-MIN-FE FUM-FA-DHA (PRENATAL 1 PO)    Take by mouth        Allergies:     Allergies   Allergen Reactions    Penicillins Other (See Comments) and Rash  Has patient had a PCN reaction causing immediate rash, facial/tongue/throat swelling, SOB or lightheadedness with hypotension: Yes  Has patient had a PCN reaction causing severe rash involving mucus membranes or skin necrosis: No  Has patient had a PCN reaction that required hospitalization: Yes  Has patient had a PCN reaction occurring within the last 10 years: Yes  If all of the above answers are "NO", then may proceed with Cephalosporin use.  Was told by parent she was allergic as child       Social History:     Social History     Tobacco Use    Smoking status: Former Smoker     Years: 3.00    Smokeless tobacco: Former Neurosurgeon    Tobacco comment: social    Substance Use Topics    Alcohol use: Never    Drug use: No       Family History:     Family History   Problem Relation Age of Onset    Breast cancer Mother 45    Cancer Mother     No known problems Father     Breast cancer Paternal Aunt 50        Paternal half-aunt    Cancer Paternal Aunt     Breast cancer Cousin 2        Paternal half-cousin; Pt reports genetic testing was negative, report not reviewed    No known problems Son     Ovarian cancer Neg Hx        Review of Systems:  Pertinent system review noted in HPI.  All other systems are negative    Physical Exam:   BP 131/84    Pulse 94    Resp 18    LMP 12/19/2018 (Exact Date)     EFM: 140 bpm/mod variability/+accelerations/no decelerations  Toco: ctx q5 min     Gen: NAD, Aox3  Psych: Normal mood and behavior  CV:  clinically  well-perfused  Pulm: non-labored breathing   Back: no CVAT  Abd: Gravid, NT  LE: no LE edema, symmetric bilaterally, no calf tenderness    Pelvic:   SVE: 5 cm/60%/-3, vertex;            Labs & Imaging:     Results     ** No results found for the last 24 hours. **            Prenatal Records Reviewed    Assessment & Plan:   39 y.o. G7P1051 at [redacted]w[redacted]d in active labor  Admit to L&D  Reassuring fetal testing  Membranes intact  Dr. Kirby Crigler, physician on call, was notified of the patient's assessment and will determine the remainder of the patient's management plan.   Findings discussed with patient and her support, who expressed understanding and are in agreement with the plan of care.   All questions answered to patient and support's apparent satisfaction.      [name], MD  OB Hospitalist Group  [facility]

## 2019-09-09 NOTE — Anesthesia Preprocedure Evaluation (Signed)
Anesthesia Evaluation    AIRWAY    Mallampati: II    TM distance: >3 FB  Neck ROM: full  Mouth Opening:full   CARDIOVASCULAR    cardiovascular exam normal       DENTAL    no notable dental hx     PULMONARY    pulmonary exam normal     OTHER FINDINGS                  Relevant Problems   NEURO/PSYCH   (+) History of preterm delivery               Anesthesia Plan    ASA 2     epidural                                 informed consent obtained      pertinent labs reviewed             Signed by: Sherol Dade 09/09/19 8:56 PM

## 2019-09-10 ENCOUNTER — Encounter (HOSPITAL_BASED_OUTPATIENT_CLINIC_OR_DEPARTMENT_OTHER): Payer: Self-pay | Admitting: Obstetrics and Gynecology

## 2019-09-10 MED ORDER — ACETAMINOPHEN 325 MG PO TABS
650.0000 mg | ORAL_TABLET | ORAL | Status: DC | PRN
Start: 2019-09-10 — End: 2019-09-11

## 2019-09-10 MED ORDER — OXYTOCIN-SODIUM CHLORIDE 30-0.9 UT/500ML-% IV SOLN
7.5000 [IU]/h | INTRAVENOUS | Status: DC
Start: 2019-09-10 — End: 2019-09-11

## 2019-09-10 MED ORDER — ACETAMINOPHEN 650 MG RE SUPP
650.0000 mg | RECTAL | Status: DC | PRN
Start: 2019-09-10 — End: 2019-09-11

## 2019-09-10 MED ORDER — WITCH HAZEL-GLYCERIN EX PADS
1.0000 | MEDICATED_PAD | CUTANEOUS | Status: DC | PRN
Start: 2019-09-10 — End: 2019-09-11
  Administered 2019-09-10: 16:00:00 1 via TOPICAL
  Filled 2019-09-10: qty 40

## 2019-09-10 MED ORDER — PROMETHAZINE HCL 12.5 MG RE SUPP
12.5000 mg | Freq: Four times a day (QID) | RECTAL | Status: DC | PRN
Start: 2019-09-10 — End: 2019-09-11

## 2019-09-10 MED ORDER — OXYCODONE HCL 5 MG PO TABS
5.0000 mg | ORAL_TABLET | ORAL | Status: DC | PRN
Start: 2019-09-10 — End: 2019-09-11

## 2019-09-10 MED ORDER — ONDANSETRON HCL 4 MG/2ML IJ SOLN
4.0000 mg | Freq: Three times a day (TID) | INTRAMUSCULAR | Status: DC | PRN
Start: 2019-09-10 — End: 2019-09-11

## 2019-09-10 MED ORDER — LANOLIN EX OINT
TOPICAL_OINTMENT | CUTANEOUS | Status: DC | PRN
Start: 2019-09-10 — End: 2019-09-11

## 2019-09-10 MED ORDER — METHYLERGONOVINE MALEATE 0.2 MG PO TABS
0.2000 mg | ORAL_TABLET | Freq: Four times a day (QID) | ORAL | Status: DC | PRN
Start: 2019-09-10 — End: 2019-09-11

## 2019-09-10 MED ORDER — METHYLERGONOVINE MALEATE 0.2 MG/ML IJ SOLN
0.20 mg | Freq: Four times a day (QID) | INTRAMUSCULAR | Status: DC | PRN
Start: 2019-09-10 — End: 2019-09-11

## 2019-09-10 MED ORDER — NALOXONE HCL 0.4 MG/ML IJ SOLN (WRAP)
0.2000 mg | INTRAMUSCULAR | Status: DC | PRN
Start: 2019-09-10 — End: 2019-09-10

## 2019-09-10 MED ORDER — ONDANSETRON 4 MG PO TBDP
4.0000 mg | ORAL_TABLET | Freq: Three times a day (TID) | ORAL | Status: DC | PRN
Start: 2019-09-10 — End: 2019-09-11

## 2019-09-10 MED ORDER — PROMETHAZINE HCL 12.5 MG PO TABS
12.5000 mg | ORAL_TABLET | Freq: Four times a day (QID) | ORAL | Status: DC | PRN
Start: 2019-09-10 — End: 2019-09-11

## 2019-09-10 MED ORDER — MISOPROSTOL 200 MCG PO TABS
800.0000 ug | ORAL_TABLET | Freq: Once | ORAL | Status: DC | PRN
Start: 2019-09-10 — End: 2019-09-11

## 2019-09-10 MED ORDER — NALOXONE HCL 0.4 MG/ML IJ SOLN (WRAP)
0.2000 mg | INTRAMUSCULAR | Status: DC | PRN
Start: 2019-09-10 — End: 2019-09-11

## 2019-09-10 MED ORDER — SODIUM CHLORIDE 0.9 % IV SOLN
6.2500 mg | Freq: Four times a day (QID) | INTRAVENOUS | Status: DC | PRN
Start: 2019-09-10 — End: 2019-09-11
  Filled 2019-09-10: qty 1

## 2019-09-10 MED ORDER — IBUPROFEN 600 MG PO TABS
600.0000 mg | ORAL_TABLET | Freq: Once | ORAL | Status: AC | PRN
Start: 2019-09-10 — End: 2019-09-10
  Administered 2019-09-10: 04:00:00 600 mg via ORAL
  Filled 2019-09-10: qty 1

## 2019-09-10 MED ORDER — ELITE-OB 50-1.25 MG PO TABS
1.0000 | ORAL_TABLET | Freq: Every day | ORAL | Status: DC
Start: 2019-09-10 — End: 2019-09-11
  Administered 2019-09-10 – 2019-09-11 (×2): 1 via ORAL
  Filled 2019-09-10 (×2): qty 1

## 2019-09-10 MED ORDER — OXYCODONE HCL 5 MG PO TABS
5.0000 mg | ORAL_TABLET | Freq: Once | ORAL | Status: DC | PRN
Start: 2019-09-10 — End: 2019-09-10

## 2019-09-10 MED ORDER — SENNOSIDES-DOCUSATE SODIUM 8.6-50 MG PO TABS
1.0000 | ORAL_TABLET | Freq: Every evening | ORAL | Status: DC | PRN
Start: 2019-09-10 — End: 2019-09-11
  Administered 2019-09-10: 23:00:00 1 via ORAL
  Filled 2019-09-10: qty 1

## 2019-09-10 MED ORDER — MISOPROSTOL 200 MCG PO TABS
800.0000 ug | ORAL_TABLET | Freq: Once | ORAL | Status: DC | PRN
Start: 2019-09-10 — End: 2019-09-10

## 2019-09-10 MED ORDER — AMMONIA AROMATIC IN INHA
1.00 | Freq: Once | RESPIRATORY_TRACT | Status: DC | PRN
Start: 2019-09-10 — End: 2019-09-11

## 2019-09-10 MED ORDER — TETANUS-DIPHTH-ACELL PERTUSSIS 5-2.5-18.5 LF-MCG/0.5 IM SUSP
0.5000 mL | INTRAMUSCULAR | Status: DC | PRN
Start: 2019-09-10 — End: 2019-09-11

## 2019-09-10 MED ORDER — METHYLERGONOVINE MALEATE 0.2 MG/ML IJ SOLN
0.2000 mg | INTRAMUSCULAR | Status: DC | PRN
Start: 2019-09-10 — End: 2019-09-10

## 2019-09-10 MED ORDER — MAGNESIUM HYDROXIDE 400 MG/5ML PO SUSP
30.0000 mL | Freq: Four times a day (QID) | ORAL | Status: DC | PRN
Start: 2019-09-10 — End: 2019-09-11

## 2019-09-10 MED ORDER — OXYTOCIN-SODIUM CHLORIDE 30-0.9 UT/500ML-% IV SOLN
7.5000 [IU]/h | INTRAVENOUS | Status: DC | PRN
Start: 2019-09-11 — End: 2019-09-10
  Administered 2019-09-10: 03:00:00 7.5 [IU]/h via INTRAVENOUS

## 2019-09-10 MED ORDER — IBUPROFEN 600 MG PO TABS
600.0000 mg | ORAL_TABLET | Freq: Four times a day (QID) | ORAL | Status: DC
Start: 2019-09-10 — End: 2019-09-11
  Administered 2019-09-10 – 2019-09-11 (×4): 600 mg via ORAL
  Filled 2019-09-10 (×4): qty 1

## 2019-09-10 MED ORDER — OXYTOCIN-SODIUM CHLORIDE 30-0.9 UT/500ML-% IV SOLN
2.0000 m[IU]/min | INTRAVENOUS | Status: DC | PRN
Start: 2019-09-10 — End: 2019-09-10
  Administered 2019-09-10: 2 m[IU]/min via INTRAVENOUS
  Filled 2019-09-10: qty 1000

## 2019-09-10 MED ORDER — BENZOCAINE 20% +/- MENTHOL 0.5% EX AERO (WRAP)
1.0000 | INHALATION_SPRAY | CUTANEOUS | Status: DC | PRN
Start: 2019-09-10 — End: 2019-09-11
  Filled 2019-09-10: qty 57

## 2019-09-10 MED ORDER — HYDROCORTISONE 1 % EX OINT
TOPICAL_OINTMENT | Freq: Three times a day (TID) | CUTANEOUS | Status: DC | PRN
Start: 2019-09-10 — End: 2019-09-11

## 2019-09-10 MED ORDER — MEASLES, MUMPS & RUBELLA VAC IJ SOLR
0.5000 mL | INTRAMUSCULAR | Status: DC | PRN
Start: 2019-09-10 — End: 2019-09-11

## 2019-09-10 MED ORDER — BISACODYL 10 MG RE SUPP
10.0000 mg | Freq: Every day | RECTAL | Status: DC | PRN
Start: 2019-09-10 — End: 2019-09-11

## 2019-09-10 NOTE — Anesthesia Procedure Notes (Signed)
Epidural    Patient location during procedure: L&D  Reason for block: Labor or C-section  Block at Surgeon's request: Yes      Staffing  Performed: anesthesiologist     Pre-procedure Checklist   Completed: patient identified, pre-op evaluation, timeout performed, risks and benefits discussed and anesthesia consent given      Epidural  Patient monitoring: Pulse oximetry and NIBP    Premedication: Meaningful Contact Maintained  Patient position: sitting    Skin Local: bupivicaine 0.25%    Attempts  Number of attempts: 1                    Successful attempt  Interspace: L3-4  Approach: midline    Needle type: Touhy needle   Needle gauge: 17  Injection technique: LOR saline    CSF Return: No   Blood Return: No  Paresthesia Pain: No    Needle Placement  Needle type: Touhy needle   Needle gauge: 17  Injection technique: LOR saline  CSF Return: No  Blood Return: No          Paresthesia Pain: No    Catheter Placement   Catheter type: end hole  Catheter size: 19 G  Catheter at skin depth: 12 cm  CSF Return: No  Blood Return: No  Test Dose:negative    Incremental injection: yes    No Catheter IV/SA Signs or Symptoms    Assessment   Patient tolerated procedure well: Yes  Block Outcome: no complications, pain improved and successful block

## 2019-09-10 NOTE — Plan of Care (Signed)
Patient acknowledges and agrees to POC. Patient is resting comfortably now with epidural in place. Patient is receiving vancomycin for GBS prophylaxis. Patient aware that medication is not considered adequate treatment for GBS and that the newborn will have to have a heel stick for a blood draw at 8 hours and 24 hours post delivery. Afebrile. Category 1 tracing. Will continue to monitor. Call bell within reach.

## 2019-09-10 NOTE — Plan of Care (Signed)
Problem: Vaginal/Cesarean Delivery  Goal: Postpartum management of pain/discomfort  Outcome: Progressing  Flowsheets (Taken 09/10/2019 0523)  Postpartum management of pain/discomfort:   Assess pain level before and following intervention   Assess pain using a consistent, developmental/age appropriate pain scale   Include patient/patient care companion in decisions related to pain management   Offer non-pharmacologic pain management interventions   Monitor for post anesthesia issues related to pain management  Goal: Uterine management  Outcome: Progressing  Flowsheets (Taken 09/10/2019 0523)  Uterine management:   Assess fundus and notify LIP if not firm, midline, or at or below the umbilicus, or if abdomen is abnormally distended   Assess for hemorrhage risk using appropriate screening tool     Patient admitted to [facility] room 522 with infant and fob at bedside. Patient VSS, fundus firm, midline, bleeding WNL. All admission safety teaching and poc reviewed with patient. Patient verbalizes understanding and signs infant falls safety form. RN will continue to monitor.

## 2019-09-10 NOTE — UM Notes (Signed)
Vaginal Delivery  ADMIT TO INPATIENT (Order #[id number]) on 09/10/19    09/09/2019 Observation  Care day #1  L&D    HPI 38y.o. G7P1 at [redacted]w[redacted]d, EDD 09/25/2019 for labor management. report ctxs since early today  GBS ppx  EFM/TOCO  oxytocin prn  CEI prn    PMH: AMA, multigravida, HSV-2 seropositive    Vs: 110/62, p 80, rr 18  FHT - 140bpm  CE - 5/60/-3        05/24/2021Changed to Inpatient  Care day#2     continued labor      Name MRN Date of Birth Age Admit Date Dt 1st Inp Disch Date LOS Pt Class VAMDN Primary Mem ID Authorization/IOL Yes/No Baby Name Delivery Method Presentation Birth Date Birth Wt Apgar 92m Apgar 23m GA   Judy, Aguirre  [id number] 12-19-1980 38Y 09/09/2019 09/10/2019 <NA> 1 IP No [id number]/ no Judy Aguirre Vaginal/ Nursery Vertex 09/10/2019 7 lb 9.3 oz 8 9 [redacted]w[redacted]d     Post partum care    Completed by: Melvyn Novas, RN  [facility]  Utilization Review  Case Management Office:  Ph: [phone]  Fax: [phone]  Please use fax number to provide authorization for hospital services or to request additional information

## 2019-09-10 NOTE — Lactation Note (Signed)
Lactation Initial Visit Note    Assessment:  Infant:        12 hours old 7 lb 9.3 oz (3440 g)  [redacted]w[redacted]d Female  breastfeeding well for age  Mother:      39 y.o. [phone] delivered via Vaginal, Spontaneous    with positive previous breastfeeding experience, mom breastfed her first child for a few months 7yrs ago.    Mom reports her breasts feel soft and started leaking colostrum after delivery.     Recommendations:  Infant:        Breastfeeding:  [x]   Continued frequent breastfeeding on demand    Supplementation:    [x]   Supplementation NOT needed at this time Mother:      [x]  Hand expression after most feeds to boost supply      Follow up:   [x]  Tomorrow  [x]  Mom to call prn     Routine education given:   [x]   Reviewed proper positioning and hand placement for deep latch  [x]   Skin to skin contact encouraged  [x]   Offer breast with all hunger cues (at least 8-12 times per 24 hours)    [x]   Watch baby for signs of effective feedings (adequate number of voids, stools and minimal weight loss)  [x]   Avoid pacifiers and formula supplements unless it is medically indicated   [x]   Reviewed normal newborn behaviors, including sleep patterns and cluster feeding   [x]   Hand expression taught (offered to teach, mom declined demonstration at this time)         Subjective:    Mother reports infant is breastfeeding every few hours and has been very eager to eat. She denies nipple pain when baby feeds.       Objective:  Infant:        Feeding Type:    breast milk      Exam:  Weight: 3.44 kg (7 lb 9.3 oz)  Percent weight loss: 0        Feeding Exam:  Offered BF assistance but mom declined at this time because she wants to rest. Reviewed breastfeeding basics and normal newborn behavior in great detail. Educated on importance of baby latching deeply to the breast and more than the nipple. Monitor baby's wet/dirty diapers and wt loss to ensure he is hydrated.    History:  APGAR: 8  and 9   Patient Active Problem List   Diagnosis    Normal  newborn (single liveborn)    Mother HSV seropositive, on suppression     Meconium in amniotic fluid    Skin tag of ear    Asymptomatic newborn w/confirmed group B Strep maternal carriage, inadequately treated     Mother:      Did not perform breast assessment at this time. Mom wanted to rest.       History:  Past Medical History:   Diagnosis Date    Anemia     iron supplement during third trimester    Breast disorder 2019    benign lump left breast    Breast lump 10/27/2017    Hyperlipidemia     Seasonal allergic rhinitis      Past Surgical History:   Procedure Laterality Date    D & E, SUCTION N/A 09/10/2016    Procedure: D & E, SUCTION;  Surgeon: Erenest Blank, MD;  Location: [facility];  Service: Gynecology;  Laterality: N/A;    DILATION AND CURETTAGE OF UTERUS  INDUCED ABORTION      x2    LEEP LLETZ  05/21/2013    Procedure: LEEP LLETZ;  Surgeon: Jeanie Cooks, MD;  Location: [facility];  Service: Gynecology;  Laterality: N/A;    VAGINAL DELIVERY  2000       No prior breast surgery     Vincenza Hews, RN

## 2019-09-10 NOTE — Plan of Care (Signed)
Problem: Safety  Goal: Patient will be free from injury during hospitalization  Outcome: Progressing  Goal: Patient will be free from infection during hospitalization  Outcome: Progressing     Problem: Pain  Goal: Pain at adequate level as identified by patient  Outcome: Progressing     Problem: Side Effects from Pain Analgesia  Goal: Patient will experience minimal side effects of analgesic therapy  Outcome: Progressing     Problem: Discharge Barriers  Goal: Patient will be discharged home or other facility with appropriate resources  Outcome: Progressing     Problem: Psychosocial and Spiritual Needs  Goal: Demonstrates ability to cope with hospitalization/illness  Outcome: Progressing     Problem: Vaginal/Cesarean Delivery  Goal: Maternal Status within defined parameters  Outcome: Progressing  Goal: Postpartum management of pain/discomfort  Outcome: Progressing  Goal: Breasts are soft with nipple integrity intact  Outcome: Progressing  Goal: Gastrointestinal/Urinary management  Outcome: Progressing  Goal: Uterine management  Outcome: Progressing  Goal: Perineum will be clean, dry, and intact and without discharge or hematoma  Outcome: Progressing  Goal: Evidence of positive mother-baby interactions  Outcome: Progressing   Patient is stable and without questions or concerns at this time.  POC reviewed this shift and patient verbalizes understanding and continues to progress.

## 2019-09-10 NOTE — Plan of Care (Signed)
Problem: Vaginal/Cesarean Delivery  Goal: Postpartum management of pain/discomfort  Flowsheets (Taken 09/10/2019 2310)  Postpartum management of pain/discomfort:   Assess pain using a consistent, developmental/age appropriate pain scale   Assess pain level before and following intervention   Offer non-pharmacologic pain management interventions  Goal: Uterine management  Outcome: Progressing  Flowsheets (Taken 09/10/2019 2310)  Uterine management:   Assess fundus and notify LIP if not firm, midline, or at or below the umbilicus, or if abdomen is abnormally distended   Assess for hemorrhage risk using appropriate screening tool  Goal: Perineum will be clean, dry, and intact and without discharge or hematoma  Outcome: Progressing  Flowsheets (Taken 09/10/2019 2310)  Perineum will be clean, dry, and intact and without discharge or hematoma:   Place cold pack on perineum   Provide pericare       Patient is stable and without questions or concerns at this time.  POC reviewed this shift and patient verbalizes understanding and continues to progress.    Patient pain level within patient stated goal: Yes, with Ibuprofen  Patient breastfeeding infant: Yes  Patient pumping: No  Patient lochia appropriate: Yes (Rubra, Scant)  Nutrition adequate for discharge: Yes  Patient reports constipation: No  Patient agrees to notify RN of any changes in her condition/status: Yes

## 2019-09-11 MED ORDER — IBUPROFEN 600 MG PO TABS
600.0000 mg | ORAL_TABLET | Freq: Four times a day (QID) | ORAL | 0 refills | Status: DC | PRN
Start: 2019-09-11 — End: 2020-01-24

## 2019-09-11 NOTE — Discharge Instr - AVS First Page (Addendum)
After Your Delivery Discharge Instructions    After Discharge Orders:    Follow up with [facility] in 2-4 weeks for a c-section and 4-6 weeks for a vaginal delivery.    @[email or web address]@      Call the Physician with any C-Section signs and symptoms:    Warning signs regarding incision:  "Popping" of stitches or staples  Foul smelling discharge or pus  More redness or streaks around incision than before    Incision care:  Keep incision dry and clean  No tub baths until 4 weeks     After your delivery - signs and symptoms to watch for:    Fever - Oral temperature greater than 100.4 degrees Fahrenheit  Foul-smelling vaginal discharge  Headache unrelieved by "pain meds"  Difficulty urinating  Breasts reddened, hard, hot to the touch  Nipple discharge which is foul-smelling or contains pus  Increased pain at the site of the incision or laceration  Difficulty breathing with or without chest pain  New calf pain especially if only on one side  Sudden, continuing increased vaginal bleeding with or without clots  Unrelieved feelings of:  Inability to cope  Sadness  Anxiety  Lack of interest in baby  Insomnia  Crying     What to do at home:  See patient education handouts for full information  Resume activity gradually   Don't lift anything heavier than baby and carrier until 4 weeks after delivery  No sex for 4 weeks  Take care of yourself by sleeping/resting as much as possible  Eat regular nutritious meals  Let someone else care for you, your baby, and housework as much as possible   Take pain medication as prescribed whenever you need them  Wear compression stockings if prescribed   To avoid/relieve constipation take stool softeners if advised   Drink lots of water/fruit juices  Increase fiber in your diet  You may take Motrin/Tylenol for pain; Colace for constipation  Breast care: Wear support bra 24/7    Divya Chakravarthy Sridharan, MD         Getting Ready to Go Home: Mother and Infant Discharge Guide  Open the  camera on your cell phone and point at the image. Link will appear on your screen.     Discharge Booklet website: [email or web address]      Resources  Postpartum Support [place]: [email or web address].org   [phone]  mychart:  [email or web address]  Carseat: [email or web address].org, [email or web address], seatcheck.org,   New Moms Groups:  [email or web address]-education  Breastfeeding Support: [phone]

## 2019-09-11 NOTE — Discharge Summary (Signed)
Obstetrical Discharge Summary    Primary OB Clinician: ZOXWR Cherre Huger, MD    Patient Name: Judy Aguirre, Judy Aguirre     [facility]: Estimated Date of Delivery: 09/25/19    Gestational Age:[redacted]w[redacted]d    Antepartum complications: Principal Problem:    Uterine contractions during pregnancy  Active Problems:    HSV-2 seropositive    Elderly multigravida, third trimester    [redacted] weeks gestation of pregnancy      Date of Admission:   09/09/2019    Reason for Admission:   [redacted] weeks gestation of pregnancy [Z3A.37]  Uterine contractions during pregnancy [O62.2]    Problems:   Lists the present on admission hospital problems  Present on Admission:   Uterine contractions during pregnancy   HSV-2 seropositive      Hospital Problems:  Principal Problem:    Uterine contractions during pregnancy  Active Problems:    HSV-2 seropositive    Elderly multigravida, third trimester    [redacted] weeks gestation of pregnancy      Problem Lists:  Patient Active Problem List   Diagnosis    High grade squamous intraepithelial cervical dysplasia    History of preterm delivery    HSV-2 seropositive    Elderly multigravida, third trimester    Cystic hygroma    Breast lump    Abnormal findings on diagnostic imaging of breast    Family history of malignant neoplasm of breast    Vaginal discharge during pregnancy in third trimester    Uterine contractions during pregnancy    [redacted] weeks gestation of pregnancy       Delivery Method:    Vaginal, Spontaneous     Delivery Date:   09/10/2019      Delivery Summary:        Information for the patient's newborn:  Jannette, Cotham [[id number]]       Delivery  09/10/2019 2:50 AM by  Vaginal, Spontaneous  Sex:  female Gestational Age: [redacted]w[redacted]d  Delivery Clinician:  Providence Lanius  Living?: Living          APGARS  One minute Five minutes Ten minutes   Skin color: 0   1        Heart rate: 2   2        Grimace: 2   2        Muscle tone: 2   2        Breathing: 2   2        Totals: 8   9          Presentation/position:  Vertex         Resuscitation: Tactile Stimulation     Cord information: 3 vessels   Disposition of cord blood: Discarded    Blood gases sent? No  Complications: Nuchal   Placenta: Delivered: 09/10/2019  2:55 AM    Spontaneous  Intact appearance  Newborn Measurements:  Weight: 3.44 kg (7 lb 9.3 oz)  Length: 20.5" (52.1 cm)  Head circumference: 12.5" (31.8 cm)  Chest circumference: 13" (33 cm)   Other providers: SMOLKA, ANNE;ROBICHAUD, MEAGAN;LOPEZ, MICHELLE Ruthe Mannan J Delivery Assist;Delivery Nurse;Nicu Nurse;Nurse Practitioner   Additional  information:  Breech:     Birth Comments:               Discharge Medications:     Current Discharge Medication List      START taking these medications    Details   ibuprofen (ADVIL) 600 MG tablet Take 1 tablet (  600 mg total) by mouth every 6 (six) hours as needed for Pain  Qty: 30 tablet, Refills: 0         CONTINUE these medications which have NOT CHANGED    Details   Prenatal MV-Min-Fe Fum-FA-DHA (PRENATAL 1 PO) Take by mouth         STOP taking these medications       acyclovir (ZOVIRAX) 400 MG tablet        HYDROXYprogesterone Caproate (MAKENA IM)              Condition on discharge: Stable and recovering well    Plan    Address and phone number verified.  Follow-up appointment with Carleene Cooper in 2-4 weeks for a c-section and 4-6 weeks for a vaginal delivery.    VFIEP Cherre Huger, MD

## 2019-09-11 NOTE — Plan of Care (Signed)
Patient discharged home. Vital signs stable. Ambulating independently. Fundus and lochia WNL. Pain managed with ibuprofen prn. Discharge education completed. Postpartum depression education and resources discussed. Patient knows reasons to call the doctor and when to follow up. Discharge paperwork signed. All questions answered at this time.       Problem: Safety  Goal: Patient will be free from injury during hospitalization  Outcome: Completed  Goal: Patient will be free from infection during hospitalization  Outcome: Completed     Problem: Pain  Goal: Pain at adequate level as identified by patient  Outcome: Completed     Problem: Side Effects from Pain Analgesia  Goal: Patient will experience minimal side effects of analgesic therapy  Outcome: Completed     Problem: Discharge Barriers  Goal: Patient will be discharged home or other facility with appropriate resources  Outcome: Completed     Problem: Psychosocial and Spiritual Needs  Goal: Demonstrates ability to cope with hospitalization/illness  Outcome: Completed     Problem: Vaginal/Cesarean Delivery  Goal: Maternal Status within defined parameters  Outcome: Completed  Goal: Postpartum management of pain/discomfort  Outcome: Completed  Goal: Breasts are soft with nipple integrity intact  Outcome: Completed  Goal: Gastrointestinal/Urinary management  Outcome: Completed  Goal: Uterine management  Outcome: Completed  Goal: Perineum will be clean, dry, and intact and without discharge or hematoma  Outcome: Completed  Goal: Evidence of positive mother-baby interactions  Outcome: Completed

## 2019-09-11 NOTE — Progress Notes (Signed)
[facility] POSTPARTUM ROUNDING NOTE    Delivery Method:  Vaginal, Spontaneous     Delivery Date:   09/10/2019      Subjective:  Feeling well, no complaints. Pain well-controlled w/ PO meds.   Tolerating diet. Breast/bottle feeding. Lochia < menses.     Objective:  Vitals:    09/11/19 0558   BP: 113/72   Pulse: 92   Resp: 18   Temp: 98.2 F (36.8 C)   SpO2: 97%     NAD ambulating  Fundus: Firm, non tender;         There is no immunization history for the selected administration types on file for this patient.    Medications:     Medications Prior to Admission   Medication Sig    acyclovir (ZOVIRAX) 400 MG tablet Take 400 mg by mouth every 4 (four) hours while awake    Prenatal MV-Min-Fe Fum-FA-DHA (PRENATAL 1 PO) Take by mouth    HYDROXYprogesterone Caproate (MAKENA IM) Inject into the muscle     Current Facility-Administered Medications   Medication Dose Route Frequency    ibuprofen  600 mg Oral Q6H    prenatal vitamin  1 tablet Oral Daily       Labs:   Lab Results   Component Value Date    WBC 10.54 (H) 09/09/2019    WBC 7.49 04/15/2008    RBC 4.68 09/09/2019    HGB 13.7 09/09/2019    HCT 41.2 09/09/2019    PLT 278 09/09/2019     Assessment/Plan:  Vaginal, Spontaneous  on  09/10/2019   Stable - Postpartum Day 1  Recovering well.  Routine care, discharge in 0  day(s), F/U in 2 weeks for a cesarean delivery and 6 weeks for vaginal delivery.  Prescriptions given: Motrin   Desire circ for female infant.      ZOXWR Cherre Huger, MD

## 2019-11-21 NOTE — Progress Notes (Signed)
Subjective:      Date: 11/22/2019 7:20 PM   Patient ID: Judy Aguirre is a 39 y.o. female.    Chief Complaint:  No chief complaint on file.      HPI: Patient is a 39 y/o female who presents for acute visit:    Patient has history of fracture of Right Talus (neck) in 05/2017 - since then she has also followed with orthopedic surgery and has had post traumatic arthritis antalgic gait. Currently still continues to have pain in right lower extremity. Has reached maximum medical improvement  - she was evaluated as permanent impairment. She continues to have antalgic gait and lower extremity muscle weakness and atrophy. She needs disability forms completed for school - she was going to [employer] but has unable to continue to her multiple injuries.     Problem List:  Patient Active Problem List   Diagnosis    High grade squamous intraepithelial cervical dysplasia    History of preterm delivery    HSV-2 seropositive    Elderly multigravida, third trimester    Cystic hygroma    Breast lump    Abnormal findings on diagnostic imaging of breast    Family history of malignant neoplasm of breast    Vaginal discharge during pregnancy in third trimester    Uterine contractions during pregnancy    [redacted] weeks gestation of pregnancy       Current Medications:  Outpatient Medications Marked as Taking for the 11/22/19 encounter (Office Visit) with Sandrea Matte, DO   Medication Sig Dispense Refill    ibuprofen (ADVIL) 600 MG tablet Take 1 tablet (600 mg total) by mouth every 6 (six) hours as needed for Pain 30 tablet 0    Prenatal MV-Min-Fe Fum-FA-DHA (PRENATAL 1 PO) Take by mouth         Allergies:  Allergies   Allergen Reactions    Penicillins Other (See Comments) and Rash     Has patient had a PCN reaction causing immediate rash, facial/tongue/throat swelling, SOB or lightheadedness with hypotension: Yes  Has patient had a PCN reaction causing severe rash involving mucus membranes or skin necrosis:  No  Has patient had a PCN reaction that required hospitalization: Yes  Has patient had a PCN reaction occurring within the last 10 years: Yes  If all of the above answers are "NO", then may proceed with Cephalosporin use.  Was told by parent she was allergic as child       Past Medical History:  Past Medical History:   Diagnosis Date    Anemia     iron supplement during third trimester    Breast disorder 2019    benign lump left breast    Breast lump 10/27/2017    Hyperlipidemia     Seasonal allergic rhinitis        Past Surgical History:  Past Surgical History:   Procedure Laterality Date    D & E, SUCTION N/A 09/10/2016    Procedure: D & E, SUCTION;  Surgeon: Erenest Blank, MD;  Location: [facility];  Service: Gynecology;  Laterality: N/A;    DILATION AND CURETTAGE OF UTERUS      INDUCED ABORTION      x2    LEEP LLETZ  05/21/2013    Procedure: LEEP LLETZ;  Surgeon: Jeanie Cooks, MD;  Location: [facility];  Service: Gynecology;  Laterality: N/A;    VAGINAL DELIVERY  2000       Family History:  Family  History   Problem Relation Age of Onset    Breast cancer Mother 37    Cancer Mother     No known problems Father     Breast cancer Paternal Aunt 42        Paternal half-aunt    Cancer Paternal Aunt     Breast cancer Cousin 38        Paternal half-cousin; Pt reports genetic testing was negative, report not reviewed    No known problems Son     Ovarian cancer Neg Hx        Social History:  Social History     Tobacco Use    Smoking status: Former Smoker     Years: 3.00    Smokeless tobacco: Former Neurosurgeon    Tobacco comment: Occupational hygienist Use: Never used   Substance Use Topics    Alcohol use: Never    Drug use: No         The following sections were reviewed this encounter by the provider:   Tobacco   Allergies   Meds   Problems   Med Hx   Surg Hx   Fam Hx            ROS:    General/Constitutional:   Denies Chills. Denies Fatigue. Denies Fever.   Ophthalmologic:   Denies  Blurred vision.   ENT:   Denies Nasal Discharge. Denies Sinus pain. Denies Sore throat.   Respiratory:   Denies Cough. Denies Shortness of breath. Denies Wheezing.   Cardiovascular:   Denies Chest pain. Denies Chest pain with exertion. Denies Palpitations. Denies  Swelling in hands/feet.   Gastrointestinal:   Denies Abdominal pain. Denies Constipation. Denies Diarrhea. Denies Nausea. Denies  Vomiting.   Skin:   Denies Rash.   Neurologic:   Denies Dizziness. Denies Headache. Admits Tingling/Numbness.     Objective:   Vitals:  BP 106/77 (BP Site: Left arm, Patient Position: Sitting, Cuff Size: Large)    Pulse 84    Temp 97.9 F (36.6 C) (Tympanic)    Wt 75.3 kg (166 lb)    LMP 12/19/2018 (Exact Date)    SpO2 98%    Breastfeeding Yes    BMI 26.00 kg/m       Physical Exam:  General Examination:   GENERAL APPEARANCE: alert, in no acute distress, well developed, well nourished  oriented to time, place, and person.   ORAL CAVITY: normal oropharynx, normal lips, mucosa moist.   NECK/THYROID: neck supple, no carotid bruit, no neck mass palpated, no jugular venous distention, no thyromegaly.  LYMPH: no cervical/supraclavicular adenopathy   HEART: S1, S2 normal, no murmurs, rubs, gallops, regular rate and rhythm.   LUNGS: normal effort / no distress, normal breath sounds, clear to auscultation  bilaterally, no wheezes, rales, rhonchi.   EXTREMITIES: No LE edema bilaterally.   PSYCH: alert and oriented to time,place and person.   SKIN: moist, no focal rash    Assessment:       1. Disability of walking    2. Closed displaced fracture of right talus, unspecified portion of talus, sequela        Plan:   Disability forms completed  Patient will continue to follow with orthopedics/pain management         Follow-up:   Return if symptoms worsen or fail to improve.       Esaw Knippel, DO

## 2019-11-22 ENCOUNTER — Encounter (INDEPENDENT_AMBULATORY_CARE_PROVIDER_SITE_OTHER): Payer: Self-pay

## 2019-11-22 ENCOUNTER — Ambulatory Visit (INDEPENDENT_AMBULATORY_CARE_PROVIDER_SITE_OTHER): Payer: No Typology Code available for payment source | Admitting: Family Medicine

## 2019-11-22 ENCOUNTER — Encounter (INDEPENDENT_AMBULATORY_CARE_PROVIDER_SITE_OTHER): Payer: Self-pay | Admitting: Family Medicine

## 2019-11-22 VITALS — BP 106/77 | HR 84 | Temp 97.9°F | Wt 166.0 lb

## 2019-11-22 DIAGNOSIS — S92101S Unspecified fracture of right talus, sequela: Secondary | ICD-10-CM

## 2019-11-22 DIAGNOSIS — R262 Difficulty in walking, not elsewhere classified: Secondary | ICD-10-CM

## 2019-11-22 NOTE — Progress Notes (Signed)
Have you seen any specialists/other providers since your last visit with us?    Yes,OB/GYN    Arm preference verified?   Yes    The patient is due for pap smear

## 2019-11-25 ENCOUNTER — Encounter (INDEPENDENT_AMBULATORY_CARE_PROVIDER_SITE_OTHER): Payer: Self-pay | Admitting: Family Medicine

## 2020-01-23 ENCOUNTER — Ambulatory Visit (INDEPENDENT_AMBULATORY_CARE_PROVIDER_SITE_OTHER): Payer: No Typology Code available for payment source | Admitting: Family Medicine

## 2020-01-23 NOTE — Progress Notes (Signed)
Subjective:      Date: 01/24/2020 3:22 PM   Patient ID: Judy Aguirre is a 39 y.o. female.    Chief Complaint:  No chief complaint on file.      [id number] is a 39 y/o female who presents for acute visit:    Complete disability placard form for permanent disability  Patient continues to have issues with ambulating due to previous right talar fracture. 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       Problem List:  Patient Active Problem List   Diagnosis    High grade squamous intraepithelial cervical dysplasia    History of preterm delivery    HSV-2 seropositive    Elderly multigravida, third trimester    Cystic hygroma    Breast lump    Abnormal findings on diagnostic imaging of breast    Family history of malignant neoplasm of breast    Vaginal discharge during pregnancy in third trimester    Uterine contractions during pregnancy    [redacted] weeks gestation of pregnancy       Current Medications:  Outpatient Medications Marked as Taking for the 01/24/20 encounter (Office Visit) with Sandrea Matte, DO   Medication Sig Dispense Refill    Prenatal MV-Min-Fe Fum-FA-DHA (PRENATAL 1 PO) Take by mouth         Allergies:  Allergies   Allergen Reactions    Penicillins Other (See Comments) and Rash     Has patient had a PCN reaction causing immediate rash, facial/tongue/throat swelling, SOB or lightheadedness with hypotension: Yes  Has patient had a PCN reaction causing severe rash involving mucus membranes or skin necrosis: No  Has patient had a PCN reaction that required hospitalization: Yes  Has patient had a PCN reaction occurring within the last 10 years: Yes  If all of the above answers are "NO", then may proceed with Cephalosporin use.  Was told by parent she was allergic as child       Past Medical History:  Past Medical History:   Diagnosis Date    Anemia     iron supplement during third trimester    Breast disorder  2019    benign lump left breast    Breast lump 10/27/2017    Hyperlipidemia     Seasonal allergic rhinitis        Past Surgical History:  Past Surgical History:   Procedure Laterality Date    D & E, SUCTION N/A 09/10/2016    Procedure: D & E, SUCTION;  Surgeon: Erenest Blank, MD;  Location: [facility];  Service: Gynecology;  Laterality: N/A;    DILATION AND CURETTAGE OF UTERUS      INDUCED ABORTION      x2    LEEP LLETZ  05/21/2013    Procedure: LEEP LLETZ;  Surgeon: Jeanie Cooks, MD;  Location: [facility];  Service: Gynecology;  Laterality: N/A;    VAGINAL DELIVERY  2000       Family History:  Family History   Problem Relation Age of Onset    Breast cancer Mother 31    Cancer Mother     No known problems Father     Breast cancer Paternal Aunt 67        Paternal half-aunt    Cancer Paternal Aunt     Breast cancer Cousin 58        Paternal half-cousin; Pt reports genetic testing was negative,  report not reviewed    No known problems Son     Ovarian cancer Neg Hx        Social History:  Social History     Tobacco Use    Smoking status: Former Smoker     Years: 3.00    Smokeless tobacco: Former Neurosurgeon    Tobacco comment: Occupational hygienist Use: Never used   Substance Use Topics    Alcohol use: Never    Drug use: No       The following sections were reviewed this encounter by the provider:   Tobacco   Allergies   Meds   Problems   Med Hx   Surg Hx   Fam Hx          Vitals:  BP 109/66 (BP Site: Left arm, Patient Position: Sitting, Cuff Size: Medium)    Pulse 86    Temp 97.3 F (36.3 C) (Temporal)    Wt 78 kg (172 lb)    SpO2 98%    Breastfeeding Yes    BMI 26.94 kg/m     ROS:  General/Constitutional:   Denies Chills. Denies Fatigue. Denies Fever.   Ophthalmologic:   Denies Blurred vision.   ENT:   Denies Nasal Discharge. Denies Sinus pain. Denies Sore throat.   Respiratory:   Denies Cough. Denies Shortness of breath. Denies Wheezing.   Cardiovascular:   Denies Chest pain.  Denies Chest pain with exertion. Denies Palpitations. Denies  Swelling in hands/feet.   Gastrointestinal:   Denies Abdominal pain. Denies Constipation. Denies Diarrhea. Denies Nausea. Denies  Vomiting.   Skin:   Denies Rash.   Neurologic:   Denies Dizziness. Denies Headache. Denies Tingling/Numbness.        Objective:   Physical Exam:  General Examination:   GENERAL APPEARANCE: alert, in no acute distress, well developed, well nourished  oriented to time, place, and person.   PSYCH: alert and oriented to time,place and person.   SKIN: moist, no focal rash       Assessment:       1. Disability of walking    2. Closed displaced fracture of right talus, unspecified portion of talus, sequela        Plan:     Forms completed for handicap placards      Follow-up:   No follow-ups on file.     Kanisha Duba, DO

## 2020-01-24 ENCOUNTER — Ambulatory Visit (INDEPENDENT_AMBULATORY_CARE_PROVIDER_SITE_OTHER): Payer: No Typology Code available for payment source | Admitting: Family Medicine

## 2020-01-24 ENCOUNTER — Encounter (INDEPENDENT_AMBULATORY_CARE_PROVIDER_SITE_OTHER): Payer: Self-pay | Admitting: Family Medicine

## 2020-01-24 VITALS — BP 109/66 | HR 86 | Temp 97.3°F | Wt 172.0 lb

## 2020-01-24 DIAGNOSIS — S92101S Unspecified fracture of right talus, sequela: Secondary | ICD-10-CM

## 2020-01-24 DIAGNOSIS — R262 Difficulty in walking, not elsewhere classified: Secondary | ICD-10-CM

## 2020-01-24 NOTE — Progress Notes (Signed)
Have you seen any specialists/other providers since your last visit with us?    No    Arm preference verified?   Yes    The patient is due for pap smear

## 2020-02-25 ENCOUNTER — Encounter (INDEPENDENT_AMBULATORY_CARE_PROVIDER_SITE_OTHER): Payer: Self-pay

## 2020-02-26 ENCOUNTER — Encounter (INDEPENDENT_AMBULATORY_CARE_PROVIDER_SITE_OTHER): Payer: Self-pay

## 2020-03-03 LAB — RPR: RPR: NONREACTIVE

## 2020-03-18 ENCOUNTER — Other Ambulatory Visit (HOSPITAL_BASED_OUTPATIENT_CLINIC_OR_DEPARTMENT_OTHER): Payer: Self-pay | Admitting: Obstetrics and Gynecology

## 2020-03-18 DIAGNOSIS — Z3682 Encounter for antenatal screening for nuchal translucency: Secondary | ICD-10-CM

## 2020-03-18 DIAGNOSIS — O359XX0 Maternal care for (suspected) fetal abnormality and damage, unspecified, not applicable or unspecified: Secondary | ICD-10-CM

## 2020-03-26 ENCOUNTER — Encounter (INDEPENDENT_AMBULATORY_CARE_PROVIDER_SITE_OTHER): Payer: Self-pay

## 2020-03-27 ENCOUNTER — Encounter (INDEPENDENT_AMBULATORY_CARE_PROVIDER_SITE_OTHER): Payer: Self-pay

## 2020-04-07 ENCOUNTER — Inpatient Hospital Stay
Admission: RE | Admit: 2020-04-07 | Discharge: 2020-04-07 | Disposition: A | Discharge status: Home / Self Care | Payer: No Typology Code available for payment source | Source: Ambulatory Visit | Attending: Obstetrics and Gynecology | Admitting: Obstetrics and Gynecology

## 2020-04-07 DIAGNOSIS — O09521 Supervision of elderly multigravida, first trimester: Secondary | ICD-10-CM | POA: Insufficient documentation

## 2020-04-07 DIAGNOSIS — O09891 Supervision of other high risk pregnancies, first trimester: Secondary | ICD-10-CM | POA: Insufficient documentation

## 2020-04-07 DIAGNOSIS — Z3A12 12 weeks gestation of pregnancy: Secondary | ICD-10-CM | POA: Insufficient documentation

## 2020-04-07 DIAGNOSIS — O09211 Supervision of pregnancy with history of pre-term labor, first trimester: Secondary | ICD-10-CM | POA: Insufficient documentation

## 2020-04-07 DIAGNOSIS — O359XX0 Maternal care for (suspected) fetal abnormality and damage, unspecified, not applicable or unspecified: Secondary | ICD-10-CM

## 2020-04-07 DIAGNOSIS — Z3682 Encounter for antenatal screening for nuchal translucency: Secondary | ICD-10-CM | POA: Insufficient documentation

## 2020-04-08 ENCOUNTER — Other Ambulatory Visit (HOSPITAL_BASED_OUTPATIENT_CLINIC_OR_DEPARTMENT_OTHER): Payer: Self-pay | Admitting: Obstetrics and Gynecology

## 2020-04-08 DIAGNOSIS — Z8751 Personal history of pre-term labor: Secondary | ICD-10-CM

## 2020-04-26 ENCOUNTER — Encounter (INDEPENDENT_AMBULATORY_CARE_PROVIDER_SITE_OTHER): Payer: Self-pay

## 2020-04-27 ENCOUNTER — Encounter (INDEPENDENT_AMBULATORY_CARE_PROVIDER_SITE_OTHER): Payer: Self-pay

## 2020-05-07 ENCOUNTER — Ambulatory Visit (HOSPITAL_BASED_OUTPATIENT_CLINIC_OR_DEPARTMENT_OTHER): Payer: No Typology Code available for payment source

## 2020-05-07 ENCOUNTER — Institutional Professional Consult (permissible substitution) (HOSPITAL_BASED_OUTPATIENT_CLINIC_OR_DEPARTMENT_OTHER): Payer: No Typology Code available for payment source

## 2020-05-12 ENCOUNTER — Ambulatory Visit
Admission: RE | Admit: 2020-05-12 | Discharge: 2020-05-12 | Disposition: A | Discharge status: Home / Self Care | Payer: Commercial Managed Care - POS | Source: Ambulatory Visit | Attending: Obstetrics and Gynecology | Admitting: Obstetrics and Gynecology

## 2020-05-12 ENCOUNTER — Ambulatory Visit (HOSPITAL_BASED_OUTPATIENT_CLINIC_OR_DEPARTMENT_OTHER)
Admission: RE | Admit: 2020-05-12 | Discharge: 2020-05-12 | Disposition: A | Discharge status: Home / Self Care | Payer: Commercial Managed Care - POS | Source: Ambulatory Visit | Attending: Obstetrics and Gynecology | Admitting: Obstetrics and Gynecology

## 2020-05-12 ENCOUNTER — Other Ambulatory Visit (HOSPITAL_BASED_OUTPATIENT_CLINIC_OR_DEPARTMENT_OTHER): Payer: Self-pay | Admitting: Obstetrics and Gynecology

## 2020-05-12 DIAGNOSIS — O09523 Supervision of elderly multigravida, third trimester: Secondary | ICD-10-CM

## 2020-05-12 DIAGNOSIS — Z3A17 17 weeks gestation of pregnancy: Secondary | ICD-10-CM | POA: Insufficient documentation

## 2020-05-12 DIAGNOSIS — O09212 Supervision of pregnancy with history of pre-term labor, second trimester: Secondary | ICD-10-CM | POA: Insufficient documentation

## 2020-05-12 DIAGNOSIS — Z8751 Personal history of pre-term labor: Secondary | ICD-10-CM

## 2020-05-12 DIAGNOSIS — E785 Hyperlipidemia, unspecified: Secondary | ICD-10-CM | POA: Insufficient documentation

## 2020-05-12 DIAGNOSIS — Z3689 Encounter for other specified antenatal screening: Secondary | ICD-10-CM | POA: Insufficient documentation

## 2020-05-12 DIAGNOSIS — O09292 Supervision of pregnancy with other poor reproductive or obstetric history, second trimester: Secondary | ICD-10-CM | POA: Insufficient documentation

## 2020-05-12 DIAGNOSIS — Z79899 Other long term (current) drug therapy: Secondary | ICD-10-CM | POA: Insufficient documentation

## 2020-05-12 DIAGNOSIS — O09522 Supervision of elderly multigravida, second trimester: Secondary | ICD-10-CM | POA: Insufficient documentation

## 2020-05-12 DIAGNOSIS — O99282 Endocrine, nutritional and metabolic diseases complicating pregnancy, second trimester: Secondary | ICD-10-CM | POA: Insufficient documentation

## 2020-05-12 NOTE — Progress Notes (Signed)
MATERNAL FETAL MEDICINE CONSULTATION    Date Time: 05/12/20 3:04 PM  Patient Name: Judy Aguirre  Referring Physician: Providence Lanius, MD  Consulting Physician: Marya Amsler, MD    CC: hx preterm labor, AMA, short inter pregnancy interval     History of Presenting Illness:   I have been asked by Dr Kirby Crigler to see this patient for MFM consultation. Patient is a 41 y.o., [id number] (per patient) at 52 w 4 d EDD 10/16/20 with a history of preterm labor in 2000. She was admitted for 2 days and sent home on oral tocolytics. She reports that she went into labor and delivered at 36+ weeks, 2 days after tocolysis was stopped.  In her second ongoing pregnancy, she was given Makena and delivered at 60 w 6 d.    Past OB/GYN History:   # 1 - Ab    # 2 - Ab    # 3 - Ab    # 4- Date:01/29/99: 36 weeks after PTL with tocolysis M 7 lb 2 oz    #5- Date: 09/10/19, Sex: Female, Weight: 3.44 kg (7 lb 9.3 oz), GA: [redacted]w[redacted]d, Delivery: Vaginal, Spontaneous, Apgar1: 8, Apgar5: 9, Living: Living, Birth Comments: Makena     Past Medical History:     Past Medical History:   Diagnosis Date    Anemia     iron supplement during third trimester    Breast disorder 2019    benign lump left breast    Breast lump 10/27/2017    Hyperlipidemia     Seasonal allergic rhinitis        Past Surgical History:     Past Surgical History:   Procedure Laterality Date    D & E, SUCTION N/A 09/10/2016    Procedure: D & E, SUCTION;  Surgeon: Erenest Blank, MD;  Location: [facility];  Service: Gynecology;  Laterality: N/A;    DILATION AND CURETTAGE OF UTERUS      INDUCED ABORTION      x2    LEEP LLETZ  05/21/2013    Procedure: LEEP LLETZ;  Surgeon: Jeanie Cooks, MD;  Location: [facility];  Service: Gynecology;  Laterality: N/A;    VAGINAL DELIVERY  2000       Review of Systems:     No contractions, all other systems negative  Medications / Herbals / OTC:     Current Outpatient Medications on File Prior to Encounter   Medication Sig  Dispense Refill    Prenatal MV-Min-Fe Fum-FA-DHA (PRENATAL 1 PO) Take by mouth       No current facility-administered medications on file prior to encounter.     To begin Makena   Allergies:     Allergies   Allergen Reactions    Penicillins Other (See Comments) and Rash     Has patient had a PCN reaction causing immediate rash, facial/tongue/throat swelling, SOB or lightheadedness with hypotension: Yes  Has patient had a PCN reaction causing severe rash involving mucus membranes or skin necrosis: No  Has patient had a PCN reaction that required hospitalization: Yes  Has patient had a PCN reaction occurring within the last 10 years: Yes  If all of the above answers are "NO", then may proceed with Cephalosporin use.  Was told by parent she was allergic as child       Prenatal Labs:   Blood Type: B POS       Vitals/Pertinent Physical Exam:     There were no  vitals filed for this visit.  Physical exam not performed    Ultrasound:     - Single intrauterine pregnancy in cephalic presentation. FHR 146   - Estimated fetal weight is  232 g, at the 84% percentile, within normal limits.  - Amniotic MVP 3.9 cm, within normal limits.  - The cervical length measured transvaginally is normal, at 42 mm .  - Limited anatomy survey was performed.    Current Labs     Lab Results   Component Value Date    ABORH B POS 09/09/2019     Results     ** No results found for the last 24 hours. **        Assessment:   Patient is a 40 y.o., referred for MFM consultation due to hx preterm labor, AMA and short interpregnancy interval.    Discussion/Recommendations:   Patient had a term delivery in 2021 with Makena, she reports the plan is for her to start that shortly.    I explained that her short pregnancy interval is an independent risk factor for preterm labor. I recommend monitoring of cervical length at 20,22 and 24 weeks. Third trimester growth scan is recommended for AMA.    Thank you for your consultation and the opportunity to assist  in the care of this patient. I spent 15 minutes in review of records, MDM and face to face counseling, > 50% of time in face to face consultation with the patient.    Thank you for your kind referral and the opportunity to assist in the care of this lovely patient.    Sincerely,    Marya Amsler, MD

## 2020-05-13 ENCOUNTER — Other Ambulatory Visit (HOSPITAL_BASED_OUTPATIENT_CLINIC_OR_DEPARTMENT_OTHER): Payer: Self-pay | Admitting: Obstetrics and Gynecology

## 2020-05-13 DIAGNOSIS — Z369 Encounter for antenatal screening, unspecified: Secondary | ICD-10-CM

## 2020-05-13 NOTE — Addendum Note (Signed)
Encounter addended by: Cherylann Parr, RN on: 05/13/2020 2:55 PM   Actions taken: Charge Capture section accepted

## 2020-05-27 ENCOUNTER — Encounter (INDEPENDENT_AMBULATORY_CARE_PROVIDER_SITE_OTHER): Payer: Self-pay

## 2020-05-28 ENCOUNTER — Encounter (INDEPENDENT_AMBULATORY_CARE_PROVIDER_SITE_OTHER): Payer: Self-pay

## 2020-06-04 ENCOUNTER — Ambulatory Visit (HOSPITAL_BASED_OUTPATIENT_CLINIC_OR_DEPARTMENT_OTHER): Payer: No Typology Code available for payment source

## 2020-06-09 ENCOUNTER — Inpatient Hospital Stay
Admission: RE | Admit: 2020-06-09 | Discharge: 2020-06-09 | Disposition: A | Discharge status: Home / Self Care | Payer: Commercial Managed Care - POS | Source: Ambulatory Visit | Attending: Obstetrics and Gynecology | Admitting: Obstetrics and Gynecology

## 2020-06-09 DIAGNOSIS — Z8751 Personal history of pre-term labor: Secondary | ICD-10-CM

## 2020-06-09 DIAGNOSIS — Z3A21 21 weeks gestation of pregnancy: Secondary | ICD-10-CM | POA: Insufficient documentation

## 2020-06-09 DIAGNOSIS — O359XX0 Maternal care for (suspected) fetal abnormality and damage, unspecified, not applicable or unspecified: Secondary | ICD-10-CM | POA: Insufficient documentation

## 2020-06-09 DIAGNOSIS — O09292 Supervision of pregnancy with other poor reproductive or obstetric history, second trimester: Secondary | ICD-10-CM | POA: Insufficient documentation

## 2020-06-16 ENCOUNTER — Other Ambulatory Visit (HOSPITAL_BASED_OUTPATIENT_CLINIC_OR_DEPARTMENT_OTHER): Payer: Medicaid Other

## 2020-06-23 ENCOUNTER — Inpatient Hospital Stay
Admission: RE | Admit: 2020-06-23 | Discharge: 2020-06-23 | Disposition: A | Discharge status: Home / Self Care | Payer: Commercial Managed Care - POS | Source: Ambulatory Visit | Attending: Obstetrics and Gynecology | Admitting: Obstetrics and Gynecology

## 2020-06-23 DIAGNOSIS — O2622 Pregnancy care for patient with recurrent pregnancy loss, second trimester: Secondary | ICD-10-CM | POA: Insufficient documentation

## 2020-06-23 DIAGNOSIS — Z369 Encounter for antenatal screening, unspecified: Secondary | ICD-10-CM

## 2020-06-23 DIAGNOSIS — O09522 Supervision of elderly multigravida, second trimester: Secondary | ICD-10-CM | POA: Insufficient documentation

## 2020-06-23 DIAGNOSIS — O09899 Supervision of other high risk pregnancies, unspecified trimester: Secondary | ICD-10-CM

## 2020-06-23 DIAGNOSIS — Z3689 Encounter for other specified antenatal screening: Secondary | ICD-10-CM

## 2020-06-23 DIAGNOSIS — O09529 Supervision of elderly multigravida, unspecified trimester: Secondary | ICD-10-CM

## 2020-06-23 DIAGNOSIS — Z3A23 23 weeks gestation of pregnancy: Secondary | ICD-10-CM | POA: Insufficient documentation

## 2020-06-23 DIAGNOSIS — O09292 Supervision of pregnancy with other poor reproductive or obstetric history, second trimester: Secondary | ICD-10-CM | POA: Insufficient documentation

## 2020-06-23 DIAGNOSIS — O09892 Supervision of other high risk pregnancies, second trimester: Secondary | ICD-10-CM | POA: Insufficient documentation

## 2020-06-30 ENCOUNTER — Other Ambulatory Visit (HOSPITAL_BASED_OUTPATIENT_CLINIC_OR_DEPARTMENT_OTHER): Payer: Commercial Managed Care - POS

## 2020-07-02 ENCOUNTER — Emergency Department: Payer: Commercial Managed Care - POS

## 2020-07-02 ENCOUNTER — Emergency Department
Admission: EM | Admit: 2020-07-02 | Discharge: 2020-07-03 | Disposition: A | Discharge status: Home / Self Care | Payer: Commercial Managed Care - POS | Attending: Emergency Medicine | Admitting: Emergency Medicine

## 2020-07-02 DIAGNOSIS — R079 Chest pain, unspecified: Secondary | ICD-10-CM

## 2020-07-02 DIAGNOSIS — Z3A26 26 weeks gestation of pregnancy: Secondary | ICD-10-CM | POA: Insufficient documentation

## 2020-07-02 DIAGNOSIS — O09522 Supervision of elderly multigravida, second trimester: Secondary | ICD-10-CM | POA: Insufficient documentation

## 2020-07-02 DIAGNOSIS — O99891 Other specified diseases and conditions complicating pregnancy: Secondary | ICD-10-CM

## 2020-07-02 LAB — HEPATIC FUNCTION PANEL
ALT: 11 U/L (ref 0–55)
AST (SGOT): 13 U/L (ref 5–34)
Albumin/Globulin Ratio: 0.8 — ABNORMAL LOW (ref 0.9–2.2)
Albumin: 2.9 g/dL — ABNORMAL LOW (ref 3.5–5.0)
Alkaline Phosphatase: 70 U/L (ref 37–106)
Bilirubin Direct: 0.1 mg/dL (ref 0.0–0.5)
Bilirubin Indirect: 0.1 mg/dL — ABNORMAL LOW (ref 0.2–1.0)
Bilirubin, Total: 0.2 mg/dL (ref 0.2–1.2)
Globulin: 3.8 g/dL — ABNORMAL HIGH (ref 2.0–3.6)
Protein, Total: 6.7 g/dL (ref 6.0–8.3)

## 2020-07-02 LAB — BASIC METABOLIC PANEL
Anion Gap: 10 (ref 5.0–15.0)
BUN: 5 mg/dL — ABNORMAL LOW (ref 7.0–19.0)
CO2: 22 mEq/L (ref 22–29)
Calcium: 9.1 mg/dL (ref 8.5–10.5)
Chloride: 103 mEq/L (ref 100–111)
Creatinine: 0.7 mg/dL (ref 0.6–1.0)
Glucose: 103 mg/dL — ABNORMAL HIGH (ref 70–100)
Potassium: 3.6 mEq/L (ref 3.5–5.1)
Sodium: 135 mEq/L — ABNORMAL LOW (ref 136–145)

## 2020-07-02 LAB — URINALYSIS REFLEX TO MICROSCOPIC EXAM - REFLEX TO CULTURE
Bilirubin, UA: NEGATIVE
Blood, UA: NEGATIVE
Glucose, UA: NEGATIVE
Ketones UA: NEGATIVE
Nitrite, UA: NEGATIVE
Protein, UR: NEGATIVE
Specific Gravity UA: 1.006 (ref 1.001–1.035)
Urine pH: 7 (ref 5.0–8.0)
Urobilinogen, UA: NORMAL mg/dL (ref 0.2–2.0)

## 2020-07-02 LAB — CBC AND DIFFERENTIAL
Absolute NRBC: 0 10*3/uL (ref 0.00–0.00)
Basophils Absolute Automated: 0.02 10*3/uL (ref 0.00–0.08)
Basophils Automated: 0.2 %
Eosinophils Absolute Automated: 0.23 10*3/uL (ref 0.00–0.44)
Eosinophils Automated: 2.4 %
Hematocrit: 37 % (ref 34.7–43.7)
Hgb: 12.3 g/dL (ref 11.4–14.8)
Immature Granulocytes Absolute: 0.06 10*3/uL (ref 0.00–0.07)
Immature Granulocytes: 0.6 %
Lymphocytes Absolute Automated: 1.81 10*3/uL (ref 0.42–3.22)
Lymphocytes Automated: 19 %
MCH: 30.6 pg (ref 25.1–33.5)
MCHC: 33.2 g/dL (ref 31.5–35.8)
MCV: 92 fL (ref 78.0–96.0)
MPV: 9.3 fL (ref 8.9–12.5)
Monocytes Absolute Automated: 0.55 10*3/uL (ref 0.21–0.85)
Monocytes: 5.8 %
Neutrophils Absolute: 6.85 10*3/uL — ABNORMAL HIGH (ref 1.10–6.33)
Neutrophils: 72 %
Nucleated RBC: 0 /100 WBC (ref 0.0–0.0)
Platelets: 248 10*3/uL (ref 142–346)
RBC: 4.02 10*6/uL (ref 3.90–5.10)
RDW: 14 % (ref 11–15)
WBC: 9.52 10*3/uL — ABNORMAL HIGH (ref 3.10–9.50)

## 2020-07-02 LAB — GFR: EGFR: >60

## 2020-07-02 LAB — B-TYPE NATRIURETIC PEPTIDE: B-Natriuretic Peptide: 12 pg/mL (ref 0–100)

## 2020-07-02 LAB — IHS D-DIMER: D-Dimer: 0.64 ug/mL FEU — ABNORMAL HIGH (ref 0.00–0.60)

## 2020-07-02 LAB — URIC ACID: Uric acid: 2.9 mg/dL (ref 2.6–6.0)

## 2020-07-02 NOTE — ED Notes (Signed)
Pt c/o bilateral ankle pain x 2 weeks and chest pain w/ radiation to back x 2 days. Pt denies N/V. Pt currently 6 months pregnant. Pt Aox4.

## 2020-07-02 NOTE — ED Provider Notes (Signed)
[facility] EMERGENCY DEPARTMENT RESIDENT H&P       CLINICAL INFORMATION        HPI:      Chief Complaint: Chest Pain and Shortness of Breath  .    Judy Aguirre is a 40 y.o. female G7P6 at [redacted] weeks gestation presents to the ED with SOB, CP and lower extremity swelling. Notes 2 weeks of progressive lower extremity edema, worse with standing, and R>L lower extremity. In the past 4 days has had new CP which is sharp, substernal, and constant. States it has not gone away in 4 days, but does get worse with movement and exertion. +worse with deep inspiration, radiates to the back/scapular area. No associated diaphoresis, n/v. Associated with SOB and feeling that she is "winded".   States pregnancy is uncomplicated, follows at [facility]. +fetal movement, no leakage of fluid. +occassional spotting, no contractions. No hx of pre-eclampsia with prior pregnancies. No hx of DVT/PE.     History obtained from: patient, review of prior chart             ROS:      Review of Systems   Constitutional: Negative for chills, fatigue and fever.   HENT: Negative for rhinorrhea, sinus pain and sore throat.    Respiratory: Positive for chest tightness and shortness of breath. Negative for cough.    Cardiovascular: Positive for chest pain and leg swelling. Negative for palpitations.   Gastrointestinal: Negative for abdominal pain, diarrhea, nausea and vomiting.   Genitourinary: Negative for difficulty urinating, frequency and hematuria.   Musculoskeletal: Negative for arthralgias and back pain.   Neurological: Negative for dizziness, syncope and headaches.   Psychiatric/Behavioral: Negative for agitation and behavioral problems.         Physical Exam:      Pulse (!) 107   BP 141/78   Resp 18   SpO2 97 %   Temp 97.3 F (36.3 C)    Physical Exam  Constitutional:       Appearance: Normal appearance.      Comments: speaking in full sentences, no tachypnea or acute respiratory distress.    HENT:      Head:  Normocephalic and atraumatic.      Mouth/Throat:      Mouth: Mucous membranes are moist.      Pharynx: Oropharynx is clear.   Eyes:      Extraocular Movements: Extraocular movements intact.      Conjunctiva/sclera: Conjunctivae normal.   Cardiovascular:      Rate and Rhythm: Normal rate and regular rhythm.      Pulses: Normal pulses.      Heart sounds: Normal heart sounds.   Pulmonary:      Effort: Pulmonary effort is normal.      Breath sounds: Normal breath sounds.   Abdominal:      General: Abdomen is flat.      Palpations: Abdomen is soft.      Tenderness: There is no abdominal tenderness.      Comments: Gravid     Musculoskeletal:      Comments: Trace pitting lower extremity edema, equal bilaterally, no erythema, warmth or overlying skin changes.    Skin:     General: Skin is warm and dry.   Neurological:      Mental Status: She is alert and oriented to person, place, and time. Mental status is at baseline.   Psychiatric:         Mood and Affect: Mood normal.  Behavior: Behavior normal.                 PAST HISTORY        Primary Care Provider: Sandrea Matte, DO        PMH/PSH:    .     Past Medical History:   Diagnosis Date    Anemia     iron supplement during third trimester    Breast disorder 2019    benign lump left breast    Breast lump 10/27/2017    Hyperlipidemia     Seasonal allergic rhinitis        She has a past surgical history that includes Vaginal delivery (2000); Induced abortion; LEEP LLETZ (05/21/2013); D & E, SUCTION (N/A, 09/10/2016); and Dilation and curettage of uterus.      Social/Family History:      She reports that she has quit smoking. She quit after 3.00 years of use. She has quit using smokeless tobacco. She reports that she does not drink alcohol and does not use drugs.    Family History   Problem Relation Age of Onset    Breast cancer Mother 33    Cancer Mother     No known problems Father     Breast cancer Paternal Aunt 2        Paternal half-aunt    Cancer  Paternal Aunt     Breast cancer Cousin 47        Paternal half-cousin; Pt reports genetic testing was negative, report not reviewed    No known problems Son     Ovarian cancer Neg Hx          Listed Medications on Arrival:    .     Home Medications     Med List Status: In Progress Set By: Eather Colas, RN at 07/02/2020 10:19 PM                Prenatal MV-Min-Fe Fum-FA-DHA (PRENATAL 1 PO)     Take by mouth         Allergies: She is allergic to penicillins.            VISIT INFORMATION        Reassessments/Clinical Course:               Conversations with Other Providers:              Medications Given in the ED:    .     ED Medication Orders (From admission, onward)    None            Procedures:      Procedures      Assessment/Plan:    40 yo F at [id number] at [redacted] weeks gestation presenting to the ED with CP, SOB x4 days, and BLE x2 weeks. Initially noted to be hypertensive 141/78, tachy 107. No hypoxia. Given symptoms, hypertension, and risk factors including pregnancy, c/f ACS, CHF, pre-eclampsia, and DVT/PE. Will get labs, CXR, EKG, and bilateral duplex to further eval.  At time of initial eval, patient resting comfortably and in no acute distress. Ob-Gyn RN to perform FHM in the ED.     Labs wo leukocytosis, transaminitis or AKI. Trop negative, BNP wnl. UA wo bacteruria or proteinuria. However, dimer positive. Had a verbal risks-benefits conversation with patient regarding CTA and radiation in pregnancy. Patient verbally agreed, plan for CTA.     CTA and duplex negative. BP improved to 108/60. Pain treated with Tylenol.  FHM done at bedside with HR 140s by L&D nurse. Discussed close return precautions, pt expressed understanding. Plan [place] with f.u with Ob and PCP in 1 day.             Shanda Howells, MD  Resident  07/02/20 [id number]       Shanda Howells, MD  Resident  07/02/20 2244       Shanda Howells, MD  Resident  07/03/20 3086       Shanda Howells, MD  Resident  07/03/20 [phone]

## 2020-07-02 NOTE — ED Notes (Signed)
I have briefly evaluated this patient's presenting complaint with [facility] ED triage RN as virtual triage physician in order to facilitate and initiate the ordering of laboratory, imaging studies, and medications as needed.       Alcide Evener, MD  07/02/20 2156

## 2020-07-02 NOTE — ED Notes (Addendum)
Called OB ED charge Clydie Braun to notify of pt , need for monitoring.  She requested  this RN calls L+D charge .     Spoke to [employer] L+D charge , informed of pt . She requested I call resident.   This RN requested L+D charge call resident to monitor pt in ED

## 2020-07-02 NOTE — ED Provider Notes (Signed)
Attending Note:   The patient was seen by the resident. I agree with assessment and care plan, and confirm the diagnosis(es).      HPI: Judy Aguirre is a 40 y.o. female G7P6 at [redacted] weeks gestation who p/w four days of sharp, persistent, substernal CP with radiation to the back that is worse with exertion and inspiration and 2 weeks of progressive BLE edema that is worse with standing and worse in the right leg.  Also reports associated SOB.  Pt denies any diaphoresis, nausea, or vomiting.    States pregnancy is uncomplicated, follows at [facility]. +fetal movement, no leakage of fluid. +occassional spotting, no contractions.    No hx of DVT, PE, or pre-eclampsia with prior pregnancies.         EKG - interpreted by me: normal sinus rhythm at 96 bpm with normal axis, normal intervals, no ST elevations, and no T-wave inversions.       MDM: 40 year old female presenting with chest pain shortness of breath, she is proximately 6 months pregnant, did have some peripheral edema on exam by the resident physician.  Concern was for preeclamptic process versus pulmonary embolism.  D-dimer was elevated, for routine CT of the chest which was negative for PE.  The patient's lab work did not reveal any LFT elevation, her blood pressure improved, she has no proteinuria, does not appear to be preeclampsia.  Unclear etiology the patient's pain, but not appear cardiac or pulmonary nature.  Patient was discharged with outpatient OB follow-up.    I was acting as a Neurosurgeon for Tera Partridge, MD on Judy Aguirre  Treatment Team: Scribe: Cheri Guppy    I am the first provider for this patient and I personally performed the services documented. Treatment Team: Scribe: Cheri Guppy is scribing for me on Judy Aguirre. This note accurately reflects work and decisions made by me.  Tera Partridge, MD     Theresa Duty, MD  07/03/20 [phone]

## 2020-07-03 ENCOUNTER — Emergency Department: Payer: Commercial Managed Care - POS

## 2020-07-03 LAB — ECG 12-LEAD
Atrial Rate: 96 {beats}/min
P Axis: 43 degrees
P-R Interval: 164 ms
Q-T Interval: 344 ms
QRS Duration: 72 ms
QTC Calculation (Bezet): 434 ms
R Axis: 40 degrees
T Axis: 24 degrees
Ventricular Rate: 96 {beats}/min

## 2020-07-03 LAB — TROPONIN I: Troponin I: <0.01 ng/mL (ref 0.00–0.05)

## 2020-07-03 MED ORDER — ACETAMINOPHEN 500 MG PO TABS
1000.0000 mg | ORAL_TABLET | Freq: Once | ORAL | Status: AC
Start: 2020-07-03 — End: 2020-07-03
  Administered 2020-07-03: 04:00:00 1000 mg via ORAL
  Filled 2020-07-03: qty 2

## 2020-07-03 MED ORDER — IOHEXOL 350 MG/ML IV SOLN
70.0000 mL | Freq: Once | INTRAVENOUS | Status: AC | PRN
Start: 2020-07-03 — End: 2020-07-03
  Administered 2020-07-03: 70 mL via INTRAVENOUS

## 2020-07-03 NOTE — Discharge Instructions (Addendum)
Dear Ms. Genia Hotter:    Thank you for choosing the [facility] Emergency Department, the premier emergency department in the [place] area.  I hope your visit today was EXCELLENT. You will receive a survey via text message that will give you the opportunity to provide feedback to your team about your visit. Please do not hesitate to reach out with any questions!    Specific instructions for your visit today:    You were seen in the ED today for chest pain and shortness of breath. You had an EKG, blood tests, and a CT angiogram that did not show a cause for your symptoms. There was no blood clot in your lungs or legs. Your liver and kidney function was normal. We recommend that you follow up with your primary care physician and your ob-gyn for re-evaluation. You should call in the morning for the earliest appointment. If you have worsening pain, shortness of breath, or change in your symptoms, leakage of fluid or change in fetal movement, please return to the ED at your earliest convenience. You can use heat, ice, gentle exercise, and tylenol as needed for pain relief.         IF YOU DO NOT CONTINUE TO IMPROVE OR YOUR CONDITION WORSENS, PLEASE CONTACT YOUR DOCTOR OR RETURN IMMEDIATELY TO THE EMERGENCY DEPARTMENT.    Sincerely,  [place], Ernst Bowler*  Attending Emergency Physician  [facility] Emergency Department      OBTAINING A PRIMARY CARE APPOINTMENT    Primary care physicians (PCPs, also known as primary care doctors) are either internists or family medicine doctors. Both types of PCPs focus on health promotion, disease prevention, patient education and counseling, and treatment of acute and chronic medical conditions.    If you need a primary care doctor, please call the below number and ask who is receiving new patients.     [facility]  Telephone:  [phone]  [email or web address]    DOCTOR REFERRALS  Call [phone] (available 24 hours a day, 7 days a week) if you need any  further referrals and we can help you find a primary care doctor or specialist.  Also, available online at:  [email or web address]    YOUR CONTACT INFORMATION  Before leaving please check with registration to make sure we have an up-to-date contact number.  You can call registration at [phone] to update your information.  For questions about your hospital bill, please call [phone].  For questions about your Emergency Dept Physician bill please call [phone].      FREE HEALTH SERVICES  If you need help with health or social services, please call 2-1-1 for a free referral to resources in your area.  2-1-1 is a free service connecting people with information on health insurance, free clinics, pregnancy, mental health, dental care, food assistance, housing, and substance abuse counseling.  Also, available online at:  http://www.[place].org    ORTHOPEDIC INJURY   Please know that significant injuries can exist even when an initial x-ray is read as normal or negative.  This can occur because some fractures (broken bones) are not initially visible on x-rays.  For this reason, close outpatient follow-up with your primary care doctor or bone specialist (orthopedist) is required.    MEDICATIONS AND FOLLOWUP  Please be aware that some prescription medications can cause drowsiness.  Use caution when driving or operating machinery.    The examination and treatment you have received in our Emergency Department is provided on an emergency basis, and is  not intended to be a substitute for your primary care physician.  It is important that your doctor checks you again and that you report any new or remaining problems at that time.      24 HOUR PHARMACIES  The nearest 24 hour pharmacy is:    [facility]  [address]  [place], [place] [address]  [phone]      ASSISTANCE WITH INSURANCE    Affordable Care Act  [facility])  Call to start or finish an application, compare plans, enroll  or ask a question.  [phone]  TTY: [phone]  Web:  Healthcare.gov    Help Enrolling in [facility]  [employer] [place]  [phone] (TOLL-FREE)  906-608-5614 (TTY)  Web:  Http://www.coverva.org    Local Help Enrolling in the [facility]  [place] [facility]  617-554-1629 (MAIN)  Email:  health-help@nvfs .org  Web:  BlackjackMyths.is  Address:  [address] [place], [place] [address]    SEDATING MEDICATIONS  Sedating medications include strong pain medications (e.g. narcotics), muscle relaxers, benzodiazepines (used for anxiety and as muscle relaxers), Benadryl/diphenhydramine and other antihistamines for allergic reactions/itching, and other medications.  If you are unsure if you have received a sedating medication, please ask your physician or nurse.  If you received a sedating medication: DO NOT drive a car. DO NOT operate machinery. DO NOT perform jobs where you need to be alert.  DO NOT drink alcoholic beverages while taking this medicine.     If you get dizzy, sit or lie down at the first signs. Be careful going up and down stairs.  Be extra careful to prevent falls.     Never give this medicine to others.     Keep this medicine out of reach of children.     Do not take or save old medicines. Throw them away when outdated.     Keep all medicines in a cool, dry place. DO NOT keep them in your bathroom medicine cabinet or in a cabinet above the stove.    MEDICATION REFILLS  Please be aware that we cannot refill any prescriptions through the ER. If you need further treatment from what is provided at your ER visit, please follow up with your primary care doctor or your pain management specialist.    FREESTANDING EMERGENCY DEPARTMENTS OF [facility]  Did you know [facility] has two freestanding ERs located just a few miles away?  [facility] and [facility] [place] have short wait times, easy free parking directly in front of the building and top patient  satisfaction scores - and the same Board Certified Emergency Medicine doctors as [facility].

## 2020-07-21 ENCOUNTER — Inpatient Hospital Stay
Admission: RE | Admit: 2020-07-21 | Discharge: 2020-07-21 | Disposition: A | Discharge status: Home / Self Care | Payer: Commercial Managed Care - POS | Source: Ambulatory Visit | Attending: Obstetrics and Gynecology | Admitting: Obstetrics and Gynecology

## 2020-07-21 DIAGNOSIS — O09892 Supervision of other high risk pregnancies, second trimester: Secondary | ICD-10-CM | POA: Insufficient documentation

## 2020-07-21 DIAGNOSIS — O09522 Supervision of elderly multigravida, second trimester: Secondary | ICD-10-CM | POA: Insufficient documentation

## 2020-07-21 DIAGNOSIS — Z8751 Personal history of pre-term labor: Secondary | ICD-10-CM

## 2020-07-21 DIAGNOSIS — O09212 Supervision of pregnancy with history of pre-term labor, second trimester: Secondary | ICD-10-CM | POA: Insufficient documentation

## 2020-07-21 DIAGNOSIS — Z3A27 27 weeks gestation of pregnancy: Secondary | ICD-10-CM | POA: Insufficient documentation

## 2020-07-25 ENCOUNTER — Encounter (INDEPENDENT_AMBULATORY_CARE_PROVIDER_SITE_OTHER): Payer: Self-pay

## 2020-08-02 ENCOUNTER — Emergency Department
Admission: EM | Admit: 2020-08-02 | Discharge: 2020-08-02 | Disposition: A | Discharge status: Home / Self Care | Payer: Commercial Managed Care - POS | Attending: Emergency Medical Services | Admitting: Emergency Medical Services

## 2020-08-02 DIAGNOSIS — O99613 Diseases of the digestive system complicating pregnancy, third trimester: Secondary | ICD-10-CM | POA: Insufficient documentation

## 2020-08-02 DIAGNOSIS — Z3A28 28 weeks gestation of pregnancy: Secondary | ICD-10-CM

## 2020-08-02 DIAGNOSIS — K529 Noninfective gastroenteritis and colitis, unspecified: Secondary | ICD-10-CM | POA: Insufficient documentation

## 2020-08-02 LAB — URINALYSIS REFLEX TO MICROSCOPIC EXAM - REFLEX TO CULTURE
Bilirubin, UA: NEGATIVE
Blood, UA: NEGATIVE
Glucose, UA: NEGATIVE
Ketones UA: 5 — AB
Leukocyte Esterase, UA: NEGATIVE
Nitrite, UA: NEGATIVE
Protein, UR: 30 — AB
Specific Gravity UA: 1.024 (ref 1.001–1.035)
Urine pH: 6 (ref 5.0–8.0)
Urobilinogen, UA: NORMAL mg/dL (ref 0.2–2.0)

## 2020-08-02 LAB — COMPREHENSIVE METABOLIC PANEL
ALT: 12 U/L (ref 0–55)
ALT: 11 U/L (ref 0–55)
AST (SGOT): 14 U/L (ref 5–34)
AST (SGOT): 13 U/L (ref 5–34)
Albumin/Globulin Ratio: 0.6 — ABNORMAL LOW (ref 0.9–2.2)
Albumin/Globulin Ratio: 0.6 — ABNORMAL LOW (ref 0.9–2.2)
Albumin: 2.7 g/dL — ABNORMAL LOW (ref 3.5–5.0)
Albumin: 2.5 g/dL — ABNORMAL LOW (ref 3.5–5.0)
Alkaline Phosphatase: 91 U/L (ref 37–117)
Alkaline Phosphatase: 83 U/L (ref 37–117)
Anion Gap: 9 (ref 5.0–15.0)
Anion Gap: 8 (ref 5.0–15.0)
BUN: 7 mg/dL (ref 7.0–19.0)
BUN: 7 mg/dL (ref 7.0–19.0)
Bilirubin, Total: 0.4 mg/dL (ref 0.2–1.2)
Bilirubin, Total: 0.4 mg/dL (ref 0.2–1.2)
CO2: 20 mEq/L — ABNORMAL LOW (ref 22–29)
CO2: 23 mEq/L (ref 22–29)
Calcium: 8.6 mg/dL (ref 8.5–10.5)
Calcium: 8.4 mg/dL — ABNORMAL LOW (ref 8.5–10.5)
Chloride: 104 mEq/L (ref 100–111)
Chloride: 104 mEq/L (ref 100–111)
Creatinine: 0.6 mg/dL (ref 0.6–1.0)
Creatinine: 0.7 mg/dL (ref 0.6–1.0)
Globulin: 4.2 g/dL — ABNORMAL HIGH (ref 2.0–3.6)
Globulin: 3.9 g/dL — ABNORMAL HIGH (ref 2.0–3.6)
Glucose: 108 mg/dL — ABNORMAL HIGH (ref 70–100)
Glucose: 103 mg/dL — ABNORMAL HIGH (ref 70–100)
Potassium: 3.8 mEq/L (ref 3.5–5.1)
Potassium: 3.8 mEq/L (ref 3.5–5.1)
Protein, Total: 6.9 g/dL (ref 6.0–8.3)
Protein, Total: 6.4 g/dL (ref 6.0–8.3)
Sodium: 133 mEq/L — ABNORMAL LOW (ref 136–145)
Sodium: 135 mEq/L — ABNORMAL LOW (ref 136–145)

## 2020-08-02 LAB — CBC AND DIFFERENTIAL
Absolute NRBC: 0 10*3/uL (ref 0.00–0.00)
Absolute NRBC: 0 10*3/uL (ref 0.00–0.00)
Basophils Absolute Automated: 0.03 10*3/uL (ref 0.00–0.08)
Basophils Absolute Automated: 0.03 10*3/uL (ref 0.00–0.08)
Basophils Automated: 0.3 %
Basophils Automated: 0.3 %
Eosinophils Absolute Automated: 0.17 10*3/uL (ref 0.00–0.44)
Eosinophils Absolute Automated: 0.18 10*3/uL (ref 0.00–0.44)
Eosinophils Automated: 1.6 %
Eosinophils Automated: 1.6 %
Hematocrit: 36.4 % (ref 34.7–43.7)
Hematocrit: 38.8 % (ref 34.7–43.7)
Hgb: 12.1 g/dL (ref 11.4–14.8)
Hgb: 12.9 g/dL (ref 11.4–14.8)
Immature Granulocytes Absolute: 0.06 10*3/uL (ref 0.00–0.07)
Immature Granulocytes Absolute: 0.09 10*3/uL — ABNORMAL HIGH (ref 0.00–0.07)
Immature Granulocytes: 0.6 %
Immature Granulocytes: 0.8 %
Lymphocytes Absolute Automated: 0.46 10*3/uL (ref 0.42–3.22)
Lymphocytes Absolute Automated: 0.61 10*3/uL (ref 0.42–3.22)
Lymphocytes Automated: 4.3 %
Lymphocytes Automated: 5.3 %
MCH: 30 pg (ref 25.1–33.5)
MCH: 30.1 pg (ref 25.1–33.5)
MCHC: 33.2 g/dL (ref 31.5–35.8)
MCHC: 33.2 g/dL (ref 31.5–35.8)
MCV: 90.3 fL (ref 78.0–96.0)
MCV: 90.4 fL (ref 78.0–96.0)
MPV: 9 fL (ref 8.9–12.5)
MPV: 9.2 fL (ref 8.9–12.5)
Monocytes Absolute Automated: 0.4 10*3/uL (ref 0.21–0.85)
Monocytes Absolute Automated: 0.41 10*3/uL (ref 0.21–0.85)
Monocytes: 3.6 %
Monocytes: 3.8 %
Neutrophils Absolute: 10.21 10*3/uL — ABNORMAL HIGH (ref 1.10–6.33)
Neutrophils Absolute: 9.48 10*3/uL — ABNORMAL HIGH (ref 1.10–6.33)
Neutrophils: 88.4 %
Neutrophils: 89.4 %
Nucleated RBC: 0 /100 WBC (ref 0.0–0.0)
Nucleated RBC: 0 /100 WBC (ref 0.0–0.0)
Platelets: 228 10*3/uL (ref 142–346)
Platelets: 243 10*3/uL (ref 142–346)
RBC: 4.03 10*6/uL (ref 3.90–5.10)
RBC: 4.29 10*6/uL (ref 3.90–5.10)
RDW: 14 % (ref 11–15)
RDW: 14 % (ref 11–15)
WBC: 10.6 10*3/uL — ABNORMAL HIGH (ref 3.10–9.50)
WBC: 11.53 10*3/uL — ABNORMAL HIGH (ref 3.10–9.50)

## 2020-08-02 LAB — GFR
EGFR: >60
EGFR: >60

## 2020-08-02 LAB — LIPASE: Lipase: 41 U/L (ref 8–78)

## 2020-08-02 MED ORDER — ONDANSETRON 4 MG PO TBDP
4.0000 mg | ORAL_TABLET | Freq: Four times a day (QID) | ORAL | 0 refills | Status: AC | PRN
Start: 2020-08-02 — End: 2020-08-09

## 2020-08-02 MED ORDER — SODIUM CHLORIDE 0.9 % IV BOLUS
500.0000 mL | Freq: Once | INTRAVENOUS | Status: AC
Start: 2020-08-02 — End: 2020-08-02
  Administered 2020-08-02: 14:00:00 500 mL via INTRAVENOUS

## 2020-08-02 MED ORDER — SODIUM CHLORIDE 0.9 % IV BOLUS
1000.0000 mL | Freq: Once | INTRAVENOUS | Status: AC
Start: 2020-08-02 — End: 2020-08-02
  Administered 2020-08-02: 10:00:00 1000 mL via INTRAVENOUS

## 2020-08-02 NOTE — ED Provider Notes (Signed)
[name] EMERGENCY DEPARTMENT H&P       This chart may have been refreshed after admission or discharge, and therefore some results may not have been available for my review at the time of my involvement with the patient.     Visit date: 08/02/2020      CLINICAL SUMMARY           Diagnosis:    .     Final diagnoses:   Gastroenteritis, acute   [redacted] weeks gestation of pregnancy         MDM Notes:    Food bourne illness in pregnant woman, with tachycardia. Hydrated with IVF, zofran, with improvement of symptoms, but remained tachy. Did not want to stay in ED any longer. Tolerating po fluids. FHT acceptable. F/U with PCP           Disposition:         Discharge               Discharge Prescriptions     Medication Sig Dispense Auth. Provider    ondansetron (Zofran ODT) 4 MG disintegrating tablet Take 1 tablet (4 mg total) by mouth every 6 (six) hours as needed for Nausea 20 tablet Patsy Lager, MD                         CLINICAL INFORMATION        HPI:      Chief Complaint: Nausea, Emesis, and Diarrhea  .    Judy Aguirre is a 40 y.o. female who is 7 mo pregnant w/ a PMHx of HLD presenting w/ moderate constant nausea, emesis, and diarrhea ongoing for the past 24 hours. Onset of sx occurred after eating wings and collard greens for lunch yesterday - she notes her significant other ate the same food and is also sick. She is unable to tolerate any PO. Endorses mild lower abd pain. Denies fever.      History obtained from: Patient          ROS:      Positive and negative ROS elements as per HPI.  All other systems reviewed and negative.      Physical Exam:      Pulse (!) 135   BP 128/68   Resp 18   SpO2 96 %   Temp 97.2 F (36.2 C)    Physical Exam  Constitutional:       General: She is not in acute distress.     Appearance: She is well-developed. She is not diaphoretic.   HENT:      Head: Normocephalic and atraumatic.      Nose: Nose normal.      Mouth/Throat:      Comments:  Mucus membranes are very dry  Eyes:      General: No scleral icterus.     Conjunctiva/sclera: Conjunctivae normal.      Pupils: Pupils are equal, round, and reactive to light.   Cardiovascular:      Rate and Rhythm: Regular rhythm. Tachycardia present.      Heart sounds: Normal heart sounds.   Pulmonary:      Effort: Pulmonary effort is normal. No respiratory distress.      Breath sounds: Normal breath sounds. No stridor. No wheezing or rales.   Abdominal:      General: Bowel sounds are normal. There is no distension.      Palpations: Abdomen is soft.  Tenderness: There is no abdominal tenderness. There is no guarding or rebound.      Comments: Gravid abd, no tenderness in RUQ or elsewhere   Musculoskeletal:         General: Normal range of motion.      Cervical back: Normal range of motion and neck supple.      Comments: No peripheral edema   Skin:     General: Skin is warm and dry.      Coloration: Skin is not pale.      Findings: No rash.   Neurological:      Mental Status: She is alert and oriented to person, place, and time.      Cranial Nerves: No cranial nerve deficit.      Motor: No abnormal muscle tone.      Coordination: Coordination normal.   Psychiatric:         Behavior: Behavior normal.         Thought Content: Thought content normal.         Judgment: Judgment normal.                  PAST HISTORY        Primary Care Provider: Sandrea Matte, DO        PMH/PSH:    .     Past Medical History:   Diagnosis Date    Anemia     iron supplement during third trimester    Breast disorder 2019    benign lump left breast    Breast lump 10/27/2017    Hyperlipidemia     Seasonal allergic rhinitis        She has a past surgical history that includes Vaginal delivery (2000); Induced abortion; LEEP LLETZ (05/21/2013); D & E, SUCTION (N/A, 09/10/2016); and Dilation and curettage of uterus.      Social/Family History:      She reports that she has quit smoking. She quit after 3.00 years of use. She has quit  using smokeless tobacco. She reports that she does not drink alcohol and does not use drugs.    Family History   Problem Relation Age of Onset    Breast cancer Mother 5    Cancer Mother     No known problems Father     Breast cancer Paternal Aunt 50        Paternal half-aunt    Cancer Paternal Aunt     Breast cancer Cousin 62        Paternal half-cousin; Pt reports genetic testing was negative, report not reviewed    No known problems Son     Ovarian cancer Neg Hx          Listed Medications on Arrival:    .     Home Medications     Med List Status: In Progress Set By: Kristen Loader, RN at 08/02/2020 10:05 AM                Prenatal MV-Min-Fe Fum-FA-DHA (PRENATAL 1 PO)     Take by mouth         Allergies: She is allergic to penicillins.            VISIT INFORMATION        Clinical Course in the ED:    Differential Diagnosis includes, but is not limited to: gastritis, pancreatitis, enteritis, gastroenteritis             Medications Given in the ED:    .  ED Medication Orders (From admission, onward)    Start Ordered     Status Ordering Provider    08/02/20 1330 08/02/20 1329  sodium chloride 0.9 % bolus 500 mL  Once        Route: Intravenous  Ordered Dose: 500 mL     Last MAR action: Rene Kocher    08/02/20 1610 08/02/20 0941  sodium chloride 0.9 % bolus 1,000 mL  Once        Route: Intravenous  Ordered Dose: 1,000 mL     Last MAR action: Stopped Patsy Lager            Procedures:      Procedures      Interpretations:                     RESULTS        Lab Results:      Results     Procedure Component Value Units Date/Time    Urinalysis Reflex to Microscopic Exam- Reflex to Culture [[id number]]  (Abnormal) Collected: 08/02/20 1146     Updated: 08/02/20 1328     Urine Type Urine, Clean Ca     Color, UA Yellow     Clarity, UA Hazy     Specific Gravity UA 1.024     Urine pH 6.0     Leukocyte Esterase, UA Negative     Nitrite, UA Negative     Protein, UR 30     Glucose, UA Negative      Ketones UA 5     Urobilinogen, UA Normal mg/dL      Bilirubin, UA Negative     Blood, UA Negative     RBC, UA 0 - 2 /hpf      WBC, UA 0 - 5 /hpf      Squamous Epithelial Cells, Urine 6 - 10 /hpf     Comprehensive metabolic panel [[id number]]  (Abnormal) Collected: 08/02/20 1008    Specimen: Blood Updated: 08/02/20 1050     Glucose 103 mg/dL      BUN 7.0 mg/dL      Creatinine 0.7 mg/dL      Sodium 562 mEq/L      Potassium 3.8 mEq/L      Chloride 104 mEq/L      CO2 23 mEq/L      Calcium 8.4 mg/dL      Protein, Total 6.4 g/dL      Albumin 2.5 g/dL      AST (SGOT) 13 U/L      ALT 11 U/L      Alkaline Phosphatase 83 U/L      Bilirubin, Total 0.4 mg/dL      Globulin 3.9 g/dL      Albumin/Globulin Ratio 0.6     Anion Gap 8.0    GFR [[id number]] Collected: 08/02/20 1008     Updated: 08/02/20 1050     EGFR >60.0    CBC and differential [[id number]]  (Abnormal) Collected: 08/02/20 1008    Specimen: Blood Updated: 08/02/20 1027     WBC 10.60 x10 3/uL      Hgb 12.1 g/dL      Hematocrit 13.2 %      Platelets 228 x10 3/uL      RBC 4.03 x10 6/uL      MCV 90.3 fL      MCH 30.0 pg      MCHC 33.2 g/dL      RDW 14 %  MPV 9.0 fL      Neutrophils 89.4 %      Lymphocytes Automated 4.3 %      Monocytes 3.8 %      Eosinophils Automated 1.6 %      Basophils Automated 0.3 %      Immature Granulocytes 0.6 %      Nucleated RBC 0.0 /100 WBC      Neutrophils Absolute 9.48 x10 3/uL      Lymphocytes Absolute Automated 0.46 x10 3/uL      Monocytes Absolute Automated 0.40 x10 3/uL      Eosinophils Absolute Automated 0.17 x10 3/uL      Basophils Absolute Automated 0.03 x10 3/uL      Immature Granulocytes Absolute 0.06 x10 3/uL      Absolute NRBC 0.00 x10 3/uL     Comprehensive metabolic panel [[id number]]  (Abnormal) Collected: 08/02/20 0935    Specimen: Blood Updated: 08/02/20 1017     Glucose 108 mg/dL      BUN 7.0 mg/dL      Creatinine 0.6 mg/dL      Sodium 696 mEq/L      Potassium 3.8 mEq/L      Chloride 104 mEq/L      CO2 20 mEq/L      Calcium  8.6 mg/dL      Protein, Total 6.9 g/dL      Albumin 2.7 g/dL      AST (SGOT) 14 U/L      ALT 12 U/L      Alkaline Phosphatase 91 U/L      Bilirubin, Total 0.4 mg/dL      Globulin 4.2 g/dL      Albumin/Globulin Ratio 0.6     Anion Gap 9.0    Lipase [[id number]] Collected: 08/02/20 0935    Specimen: Blood Updated: 08/02/20 1017     Lipase 41 U/L     GFR [[id number]] Collected: 08/02/20 0935     Updated: 08/02/20 1017     EGFR >60.0    CBC and differential [[id number]]  (Abnormal) Collected: 08/02/20 0935    Specimen: Blood Updated: 08/02/20 0959     WBC 11.53 x10 3/uL      Hgb 12.9 g/dL      Hematocrit 42.5 %      Platelets 243 x10 3/uL      RBC 4.29 x10 6/uL      MCV 90.4 fL      MCH 30.1 pg      MCHC 33.2 g/dL      RDW 14 %      MPV 9.2 fL      Neutrophils 88.4 %      Lymphocytes Automated 5.3 %      Monocytes 3.6 %      Eosinophils Automated 1.6 %      Basophils Automated 0.3 %      Immature Granulocytes 0.8 %      Nucleated RBC 0.0 /100 WBC      Neutrophils Absolute 10.21 x10 3/uL      Lymphocytes Absolute Automated 0.61 x10 3/uL      Monocytes Absolute Automated 0.41 x10 3/uL      Eosinophils Absolute Automated 0.18 x10 3/uL      Basophils Absolute Automated 0.03 x10 3/uL      Immature Granulocytes Absolute 0.09 x10 3/uL      Absolute NRBC 0.00 x10 3/uL               Radiology Results:  No orders to display               Scribe Attestation:      I was acting as a Neurosurgeon for Patsy Lager, MD on [facility]    I am the first provider for this patient and I personally performed the services documented. Mahlon Gammon is scribing for me on [employer]. This note accurately reflects work and decisions made by me.  Patsy Lager, MD            Patsy Lager, MD  08/04/20 [id number]

## 2020-08-02 NOTE — Discharge Instructions (Signed)
Dear Judy Aguirre:    Thank you for choosing the [facility] Emergency Department, the premier emergency department in the [place] area.  I hope your visit today was EXCELLENT. You will receive a survey via text message that will give you the opportunity to provide feedback to your team about your visit. Please do not hesitate to reach out with any questions!    Specific instructions for your visit today:    Please be sure to keep ahead of your fluids. Extra important since you are pregnant! Take the nausea medicine immediately if you start to feel sick. Do not take diarrhea medicine, this can stop you up and cause the bacteria to build up in your system.     IF YOU DO NOT CONTINUE TO IMPROVE OR YOUR CONDITION WORSENS, PLEASE CONTACT YOUR DOCTOR OR RETURN IMMEDIATELY TO THE EMERGENCY DEPARTMENT.    Sincerely,  Patsy Lager, MD  Attending Emergency Physician  [facility] Emergency Department      OBTAINING A PRIMARY CARE APPOINTMENT    Primary care physicians (PCPs, also known as primary care doctors) are either internists or family medicine doctors. Both types of PCPs focus on health promotion, disease prevention, patient education and counseling, and treatment of acute and chronic medical conditions.    If you need a primary care doctor, please call the below number and ask who is receiving new patients.     [facility]  Telephone:  [phone]  [email or web address]    DOCTOR REFERRALS  Call [phone] (available 24 hours a day, 7 days a week) if you need any further referrals and we can help you find a primary care doctor or specialist.  Also, available online at:  [email or web address]    YOUR CONTACT INFORMATION  Before leaving please check with registration to make sure we have an up-to-date contact number.  You can call registration at [phone] to update your information.  For questions about your hospital bill, please call [phone].  For  questions about your Emergency Dept Physician bill please call [phone].      FREE HEALTH SERVICES  If you need help with health or social services, please call 2-1-1 for a free referral to resources in your area.  2-1-1 is a free service connecting people with information on health insurance, free clinics, pregnancy, mental health, dental care, food assistance, housing, and substance abuse counseling.  Also, available online at:  http://www.[facility].org    ORTHOPEDIC INJURY   Please know that significant injuries can exist even when an initial x-ray is read as normal or negative.  This can occur because some fractures (broken bones) are not initially visible on x-rays.  For this reason, close outpatient follow-up with your primary care doctor or bone specialist (orthopedist) is required.    MEDICATIONS AND FOLLOWUP  Please be aware that some prescription medications can cause drowsiness.  Use caution when driving or operating machinery.    The examination and treatment you have received in our Emergency Department is provided on an emergency basis, and is not intended to be a substitute for your primary care physician.  It is important that your doctor checks you again and that you report any new or remaining problems at that time.      ASSISTANCE WITH INSURANCE    Affordable Care Act  [facility])  Call to start or finish an application, compare plans, enroll or ask a question.  [phone]  TTY: [phone]  Web:  Healthcare.gov    Help  Enrolling in [place]  510-021-3568 (TOLL-FREE)  [phone] (TTY)  Web:  Http://www.coverva.org    Local Help Enrolling in the [place]  [phone] (MAIN)  Email:  health-help'@nvfs'$ .org  Web:  http://lewis-perez.info/  Address:  [address], [id number] [place], [place] [address]    SEDATING MEDICATIONS  Sedating medications include strong pain medications (e.g. narcotics), muscle relaxers, benzodiazepines (used for  anxiety and as muscle relaxers), Benadryl/diphenhydramine and other antihistamines for allergic reactions/itching, and other medications.  If you are unsure if you have received a sedating medication, please ask your physician or nurse.  If you received a sedating medication: DO NOT drive a car. DO NOT operate machinery. DO NOT perform jobs where you need to be alert.  DO NOT drink alcoholic beverages while taking this medicine.     If you get dizzy, sit or lie down at the first signs. Be careful going up and down stairs.  Be extra careful to prevent falls.     Never give this medicine to others.     Keep this medicine out of reach of children.     Do not take or save old medicines. Throw them away when outdated.     Keep all medicines in a cool, dry place. DO NOT keep them in your bathroom medicine cabinet or in a cabinet above the stove.    MEDICATION REFILLS  Please be aware that we cannot refill any prescriptions through the ER. If you need further treatment from what is provided at your ER visit, please follow up with your primary care doctor or your pain management specialist.    [place]  Did you know [facility] has two freestanding ERs located just a few miles away?  [facility] [place] [facility] [place] have short wait times, easy free parking directly in front of the building and top patient satisfaction scores - and the same Board Certified Emergency Medicine doctors as [facility].

## 2020-08-02 NOTE — ED Triage Notes (Signed)
Pt reports to ED c/o vomiting, diarrhea, and nausea x24 hours. Denies nausea at time. Endorses lower abd "sore"ness. Patient reports she is 7 mo pregnant. Reports dry mouth, and feeling like she needs fluids. Patient A&Ox4.

## 2020-08-18 ENCOUNTER — Inpatient Hospital Stay
Admission: RE | Admit: 2020-08-18 | Discharge: 2020-08-18 | Disposition: A | Discharge status: Home / Self Care | Payer: Commercial Managed Care - POS | Source: Ambulatory Visit | Attending: Obstetrics and Gynecology | Admitting: Obstetrics and Gynecology

## 2020-08-18 DIAGNOSIS — Z3689 Encounter for other specified antenatal screening: Secondary | ICD-10-CM | POA: Insufficient documentation

## 2020-08-18 DIAGNOSIS — O09899 Supervision of other high risk pregnancies, unspecified trimester: Secondary | ICD-10-CM

## 2020-08-18 DIAGNOSIS — O09523 Supervision of elderly multigravida, third trimester: Secondary | ICD-10-CM | POA: Insufficient documentation

## 2020-08-18 DIAGNOSIS — O09893 Supervision of other high risk pregnancies, third trimester: Secondary | ICD-10-CM | POA: Insufficient documentation

## 2020-08-18 DIAGNOSIS — Z3A31 31 weeks gestation of pregnancy: Secondary | ICD-10-CM | POA: Insufficient documentation

## 2020-08-18 DIAGNOSIS — O09293 Supervision of pregnancy with other poor reproductive or obstetric history, third trimester: Secondary | ICD-10-CM | POA: Insufficient documentation

## 2020-08-18 DIAGNOSIS — O09529 Supervision of elderly multigravida, unspecified trimester: Secondary | ICD-10-CM

## 2020-09-22 LAB — GROUP B STREP TRANSCRIBED: GBS Transcribed: POSITIVE

## 2020-09-22 LAB — HEPATITIS B SURFACE ANTIGEN W/ REFLEX TO CONFIRMATION: Hepatitis B Surface Antigen: NEGATIVE

## 2020-09-25 ENCOUNTER — Encounter: Payer: Self-pay | Admitting: Obstetrics & Gynecology

## 2020-09-25 LAB — GONOCOCCUS CULTURE
Chlamydia trachomatis Culture: NEGATIVE
Culture Gonorrhoeae: NEGATIVE

## 2020-10-09 ENCOUNTER — Observation Stay (HOSPITAL_BASED_OUTPATIENT_CLINIC_OR_DEPARTMENT_OTHER): Payer: Commercial Managed Care - POS

## 2020-10-09 ENCOUNTER — Inpatient Hospital Stay
Admission: RE | Admit: 2020-10-09 | Discharge: 2020-10-12 | DRG: 807 | Disposition: A | Discharge status: Home / Self Care | Payer: Commercial Managed Care - POS | Attending: Obstetrics and Gynecology | Admitting: Obstetrics and Gynecology

## 2020-10-09 ENCOUNTER — Encounter (HOSPITAL_BASED_OUTPATIENT_CLINIC_OR_DEPARTMENT_OTHER): Payer: Self-pay

## 2020-10-09 DIAGNOSIS — Z3490 Encounter for supervision of normal pregnancy, unspecified, unspecified trimester: Secondary | ICD-10-CM

## 2020-10-09 DIAGNOSIS — Z3A39 39 weeks gestation of pregnancy: Secondary | ICD-10-CM

## 2020-10-09 DIAGNOSIS — Z1239 Encounter for other screening for malignant neoplasm of breast: Secondary | ICD-10-CM

## 2020-10-09 DIAGNOSIS — O99824 Streptococcus B carrier state complicating childbirth: Principal | ICD-10-CM | POA: Diagnosis present

## 2020-10-09 LAB — CBC AND DIFFERENTIAL
Absolute NRBC: 0 10*3/uL (ref 0.00–0.00)
Basophils Absolute Automated: 0.02 10*3/uL (ref 0.00–0.08)
Basophils Automated: 0.2 %
Eosinophils Absolute Automated: 0.07 10*3/uL (ref 0.00–0.44)
Eosinophils Automated: 0.8 %
Hematocrit: 38 % (ref 34.7–43.7)
Hgb: 12.9 g/dL (ref 11.4–14.8)
Immature Granulocytes Absolute: 0.05 10*3/uL (ref 0.00–0.07)
Immature Granulocytes: 0.6 %
Lymphocytes Absolute Automated: 1.76 10*3/uL (ref 0.42–3.22)
Lymphocytes Automated: 20.7 %
MCH: 31.1 pg (ref 25.1–33.5)
MCHC: 33.9 g/dL (ref 31.5–35.8)
MCV: 91.6 fL (ref 78.0–96.0)
MPV: 9.6 fL (ref 8.9–12.5)
Monocytes Absolute Automated: 0.63 10*3/uL (ref 0.21–0.85)
Monocytes: 7.4 %
Neutrophils Absolute: 5.96 10*3/uL (ref 1.10–6.33)
Neutrophils: 70.3 %
Nucleated RBC: 0 /100 WBC (ref 0.0–0.0)
Platelets: 251 10*3/uL (ref 142–346)
RBC: 4.15 10*6/uL (ref 3.90–5.10)
RDW: 14 % (ref 11–15)
WBC: 8.49 10*3/uL (ref 3.10–9.50)

## 2020-10-09 LAB — TYPE AND SCREEN
AB Screen Gel: NEGATIVE
ABO Rh: B POS

## 2020-10-09 MED ORDER — ONDANSETRON 4 MG PO TBDP
4.0000 mg | ORAL_TABLET | Freq: Three times a day (TID) | ORAL | Status: DC | PRN
Start: 2020-10-09 — End: 2020-10-10

## 2020-10-09 MED ORDER — ZOLPIDEM TARTRATE 5 MG PO TABS
5.0000 mg | ORAL_TABLET | Freq: Every evening | ORAL | Status: DC | PRN
Start: 2020-10-09 — End: 2020-10-12
  Administered 2020-10-09: 5 mg via ORAL
  Filled 2020-10-09: qty 1

## 2020-10-09 MED ORDER — PROMETHAZINE HCL 12.5 MG PO TABS
12.5000 mg | ORAL_TABLET | Freq: Four times a day (QID) | ORAL | Status: DC | PRN
Start: 2020-10-09 — End: 2020-10-10

## 2020-10-09 MED ORDER — SODIUM CHLORIDE 0.9 % IV SOLN
6.2500 mg | Freq: Four times a day (QID) | INTRAVENOUS | Status: DC | PRN
Start: 2020-10-09 — End: 2020-10-10

## 2020-10-09 MED ORDER — TERBUTALINE SULFATE 1 MG/ML IJ SOLN
0.2500 mg | Freq: Once | INTRAMUSCULAR | Status: DC | PRN
Start: 2020-10-09 — End: 2020-10-10

## 2020-10-09 MED ORDER — FAMOTIDINE 10 MG/ML IV SOLN (WRAP)
20.0000 mg | Freq: Two times a day (BID) | INTRAVENOUS | Status: DC
Start: 2020-10-10 — End: 2020-10-10

## 2020-10-09 MED ORDER — VANCOMYCIN HCL IN NACL 1-0.9 GM/250ML-% IV SOLN
1000.0000 mg | Freq: Two times a day (BID) | INTRAVENOUS | Status: DC
Start: 2020-10-09 — End: 2020-10-10
  Administered 2020-10-10 (×2): 1000 mg via INTRAVENOUS
  Filled 2020-10-09 (×4): qty 250

## 2020-10-09 MED ORDER — FAMOTIDINE 20 MG PO TABS
20.0000 mg | ORAL_TABLET | Freq: Two times a day (BID) | ORAL | Status: DC
Start: 2020-10-09 — End: 2020-10-09

## 2020-10-09 MED ORDER — ONDANSETRON HCL 4 MG/2ML IJ SOLN
4.0000 mg | Freq: Three times a day (TID) | INTRAMUSCULAR | Status: DC | PRN
Start: 2020-10-09 — End: 2020-10-10
  Administered 2020-10-10: 4 mg via INTRAVENOUS
  Filled 2020-10-09: qty 2

## 2020-10-09 MED ORDER — ACETAMINOPHEN 650 MG RE SUPP
650.0000 mg | Freq: Four times a day (QID) | RECTAL | Status: DC | PRN
Start: 2020-10-09 — End: 2020-10-10

## 2020-10-09 MED ORDER — NALOXONE HCL 0.4 MG/ML IJ SOLN (WRAP)
0.2000 mg | INTRAMUSCULAR | Status: DC | PRN
Start: 2020-10-09 — End: 2020-10-10

## 2020-10-09 MED ORDER — FAMOTIDINE 20 MG PO TABS
20.0000 mg | ORAL_TABLET | Freq: Two times a day (BID) | ORAL | Status: DC
Start: 2020-10-10 — End: 2020-10-10

## 2020-10-09 MED ORDER — ACETAMINOPHEN 325 MG PO TABS
650.0000 mg | ORAL_TABLET | Freq: Four times a day (QID) | ORAL | Status: DC | PRN
Start: 2020-10-09 — End: 2020-10-10

## 2020-10-09 MED ORDER — DIPHENHYDRAMINE HCL 25 MG PO CAPS
25.0000 mg | ORAL_CAPSULE | Freq: Four times a day (QID) | ORAL | Status: DC | PRN
Start: 2020-10-09 — End: 2020-10-12

## 2020-10-09 MED ORDER — PROMETHAZINE HCL 12.5 MG RE SUPP
12.5000 mg | Freq: Four times a day (QID) | RECTAL | Status: DC | PRN
Start: 2020-10-09 — End: 2020-10-10

## 2020-10-09 MED ORDER — FAMOTIDINE 10 MG/ML IV SOLN (WRAP)
20.0000 mg | Freq: Two times a day (BID) | INTRAVENOUS | Status: DC
Start: 2020-10-09 — End: 2020-10-09

## 2020-10-09 MED ORDER — MISOPROSTOL 25 MCG PO SPLIT TAB
25.0000 ug | ORAL_TABLET | ORAL | Status: DC | PRN
Start: 2020-10-09 — End: 2020-10-10
  Administered 2020-10-10 (×2): 25 ug via ORAL
  Filled 2020-10-09 (×2): qty 1

## 2020-10-09 NOTE — Plan of Care (Signed)
Problem: Vaginal/Cesarean Delivery  Goal: Maternal Status within defined parameters  Outcome: Progressing  Flowsheets (Taken 10/09/2020 2241)  Maternal status with defined parameters:   Monitor/assess vital signs   Perform physical assessment per phase of care   Manage complications/co-morbidities per LIP orders  Goal: Evidence of Fetal Well Being  Outcome: Progressing  Flowsheets (Taken 10/09/2020 2241)  Evidence of fetal well being:   Monitor/assess fetal heart rate. Notify LIP of Category II or III EFM tracings.   Initiate interventions for Category II or III EFM strip   Position patient for maximum uterine perfusion   Patient's position should support labor progress and expulsion efforts  Goal: Free from Maternal/Fetal Infection  Outcome: Progressing  Flowsheets (Taken 10/09/2020 2241)  Free from Maternal/Fetal Infection:   Assess for infection risk using the appropriate screening tool   Determine GBS status. If GBS positive, manage per LIP orders.  Goal: Intrapartum management of pain/discomfort  Outcome: Progressing  Flowsheets (Taken 10/09/2020 2241)  Intrapartum management of pain/discomfort:   Keep pain at acceptable level for patient   Assess pain using a consistent, developmental/age appropriate pain scale   Assess pain level before and following intervention   Monitor hemodynamic parameters in response to pain/sedation medications   Assess and manage side effects associated with pain management   Include patient/patient care companion in decisions related to pain management   Report ineffective pain management to LIP     Problem: Safety  Goal: Patient will be free from injury during hospitalization  Outcome: Progressing  Flowsheets (Taken 10/09/2020 2241)  Patient will be free from injury during hospitalization:   Assess patient's risk for falls and implement fall prevention plan of care per policy   Provide and maintain safe environment   Use appropriate transfer methods   Assess for patients risk for  elopement and implement Elopement Risk Plan per policy  Goal: Patient will be free from infection during hospitalization  Outcome: Progressing  Flowsheets (Taken 10/09/2020 2241)  Free from Infection during hospitalization:   Assess and monitor for signs and symptoms of infection   Monitor lab/diagnostic results   Monitor all insertion sites (i.e. indwelling lines, tubes, urinary catheters, and drains)   Encourage patient and family to use good hand hygiene technique     Problem: Pain  Goal: Pain at adequate level as identified by patient  Outcome: Progressing  Flowsheets (Taken 10/09/2020 2241)  Pain at adequate level as identified by patient:   Assess for risk of opioid induced respiratory depression, including snoring/sleep apnea. Alert healthcare team of risk factors identified.   Identify patient comfort function goal   Assess pain on admission, during daily assessment and/or before any "as needed" intervention(s)   Reassess pain within 30-60 minutes of any procedure/intervention, per Pain Assessment, Intervention, Reassessment (AIR) Cycle   Evaluate if patient comfort function goal is met   Evaluate patient's satisfaction with pain management progress   Offer non-pharmacological pain management interventions     Pt Judy Aguirre is a 40 year old G8P2 at 39 weeks and 0 days admitted to L&D for an elective IOL. Maternal physical assessment and vital signs are WNL. Pt is GBS positive with a severe penicillin allergy so Vancomycin 1g Q12 will be given starting with first dose of cytotec at 0200. No other signs or symptoms of infection at this time. RN discussed pain relief options with pt at bedside and questions regarding epidural placement were answered. Pt has been oriented to the room and educated on the fall  risk prevention plan. POC discussed and pt expressed no further questions or concerns at this time. FOB will be joining her tomorrow morning.     Non stick socks provided and call bell left in reach of pt.      Laminated consents provided at bedside for review.

## 2020-10-09 NOTE — Progress Notes (Signed)
Monitors removed from patient with permission of Awad MD at 2220. Monitors will be put back on at 0130 for an NST prior to first dose of cytotec for induction.     At the time of removal EFM tracing was category I with moderate variability. Occasional contractions noted.     Pt resting comfortably in bed at this time and not feeling current contractions.

## 2020-10-10 ENCOUNTER — Encounter: Payer: Self-pay | Admitting: Anesthesiology

## 2020-10-10 MED ORDER — OXYTOCIN-SODIUM CHLORIDE 30-0.9 UT/500ML-% IV SOLN
7.5000 [IU]/h | INTRAVENOUS | Status: DC
Start: 2020-10-10 — End: 2020-10-12

## 2020-10-10 MED ORDER — EPHEDRINE SULFATE 50 MG/ML IJ/IV SOLN (WRAP)
10.0000 mg | Freq: Once | Status: DC | PRN
Start: 2020-10-10 — End: 2020-10-10

## 2020-10-10 MED ORDER — TETANUS-DIPHTH-ACELL PERTUSSIS 5-2.5-18.5 LF-MCG/0.5 IM SUSP
0.5000 mL | INTRAMUSCULAR | Status: DC | PRN
Start: 2020-10-10 — End: 2020-10-12

## 2020-10-10 MED ORDER — MISOPROSTOL 200 MCG PO TABS
800.0000 ug | ORAL_TABLET | Freq: Once | ORAL | Status: DC | PRN
Start: 2020-10-10 — End: 2020-10-12

## 2020-10-10 MED ORDER — OXYTOCIN-SODIUM CHLORIDE 30-0.9 UT/500ML-% IV SOLN
4.0000 m[IU]/min | INTRAVENOUS | Status: AC | PRN
Start: 2020-10-10 — End: 2020-10-11
  Administered 2020-10-10: 4 m[IU]/min via INTRAVENOUS
  Filled 2020-10-10: qty 1000

## 2020-10-10 MED ORDER — FENTANYL CITRATE (PF) 50 MCG/ML IJ SOLN (WRAP)
100.0000 ug | Freq: Once | INTRAMUSCULAR | Status: DC
Start: 2020-10-10 — End: 2020-10-10
  Filled 2020-10-10: qty 2

## 2020-10-10 MED ORDER — MISOPROSTOL 200 MCG PO TABS
800.0000 ug | ORAL_TABLET | Freq: Once | ORAL | Status: DC | PRN
Start: 2020-10-10 — End: 2020-10-10

## 2020-10-10 MED ORDER — SENNOSIDES-DOCUSATE SODIUM 8.6-50 MG PO TABS
1.0000 | ORAL_TABLET | Freq: Every evening | ORAL | Status: DC | PRN
Start: 2020-10-10 — End: 2020-10-12
  Administered 2020-10-11: 22:00:00 1 via ORAL
  Filled 2020-10-10: qty 1

## 2020-10-10 MED ORDER — LACTATED RINGERS IV SOLN
INTRAVENOUS | Status: DC
Start: 2020-10-10 — End: 2020-10-12

## 2020-10-10 MED ORDER — BISACODYL 10 MG RE SUPP
10.0000 mg | Freq: Every day | RECTAL | Status: DC | PRN
Start: 2020-10-10 — End: 2020-10-12

## 2020-10-10 MED ORDER — OXYCODONE HCL 5 MG PO TABS
5.0000 mg | ORAL_TABLET | Freq: Once | ORAL | Status: DC | PRN
Start: 2020-10-10 — End: 2020-10-12

## 2020-10-10 MED ORDER — PROMETHAZINE HCL 12.5 MG RE SUPP
12.5000 mg | Freq: Four times a day (QID) | RECTAL | Status: DC | PRN
Start: 2020-10-10 — End: 2020-10-12

## 2020-10-10 MED ORDER — ONDANSETRON HCL 4 MG/2ML IJ SOLN
4.0000 mg | Freq: Three times a day (TID) | INTRAMUSCULAR | Status: DC | PRN
Start: 2020-10-10 — End: 2020-10-12

## 2020-10-10 MED ORDER — LANOLIN EX OINT
TOPICAL_OINTMENT | CUTANEOUS | Status: DC | PRN
Start: 2020-10-10 — End: 2020-10-12

## 2020-10-10 MED ORDER — OXYTOCIN-SODIUM CHLORIDE 30-0.9 UT/500ML-% IV SOLN
7.5000 [IU]/h | INTRAVENOUS | Status: AC | PRN
Start: 2020-10-11 — End: 2020-10-11
  Administered 2020-10-10: 7.5 [IU]/h via INTRAVENOUS

## 2020-10-10 MED ORDER — ONDANSETRON 4 MG PO TBDP
4.0000 mg | ORAL_TABLET | Freq: Three times a day (TID) | ORAL | Status: DC | PRN
Start: 2020-10-10 — End: 2020-10-12

## 2020-10-10 MED ORDER — IBUPROFEN 600 MG PO TABS
600.0000 mg | ORAL_TABLET | Freq: Once | ORAL | Status: AC | PRN
Start: 2020-10-10 — End: 2020-10-10
  Administered 2020-10-10: 600 mg via ORAL
  Filled 2020-10-10: qty 1

## 2020-10-10 MED ORDER — SOD CITRATE-CITRIC ACID 500-334 MG/5ML PO SOLN
30.0000 mL | Freq: Once | ORAL | Status: DC | PRN
Start: 2020-10-10 — End: 2020-10-10

## 2020-10-10 MED ORDER — MAGNESIUM HYDROXIDE 400 MG/5ML PO SUSP
30.0000 mL | Freq: Four times a day (QID) | ORAL | Status: DC | PRN
Start: 2020-10-10 — End: 2020-10-12

## 2020-10-10 MED ORDER — BENZOCAINE 20% +/- MENTHOL 0.5% EX AERO (WRAP)
1.0000 | INHALATION_SPRAY | CUTANEOUS | Status: DC | PRN
Start: 2020-10-10 — End: 2020-10-12
  Filled 2020-10-10: qty 57

## 2020-10-10 MED ORDER — NALOXONE HCL 0.4 MG/ML IJ SOLN (WRAP)
0.2000 mg | INTRAMUSCULAR | Status: DC | PRN
Start: 2020-10-10 — End: 2020-10-12

## 2020-10-10 MED ORDER — METHYLERGONOVINE MALEATE 0.2 MG PO TABS
0.2000 mg | ORAL_TABLET | Freq: Four times a day (QID) | ORAL | Status: DC | PRN
Start: 2020-10-10 — End: 2020-10-12

## 2020-10-10 MED ORDER — FAMOTIDINE 10 MG/ML IV SOLN (WRAP)
20.0000 mg | Freq: Once | INTRAVENOUS | Status: DC | PRN
Start: 2020-10-10 — End: 2020-10-10

## 2020-10-10 MED ORDER — BUPIVACAINE HCL (PF) 0.25 % IJ SOLN
30.0000 mL | Freq: Once | INTRAMUSCULAR | Status: DC
Start: 2020-10-10 — End: 2020-10-10

## 2020-10-10 MED ORDER — ACETAMINOPHEN 650 MG RE SUPP
650.0000 mg | RECTAL | Status: DC | PRN
Start: 2020-10-10 — End: 2020-10-12

## 2020-10-10 MED ORDER — NALOXONE HCL 0.4 MG/ML IJ SOLN (WRAP)
0.1000 mg | INTRAMUSCULAR | Status: DC | PRN
Start: 2020-10-10 — End: 2020-10-10

## 2020-10-10 MED ORDER — BUPIVACAINE HCL (PF) 0.5 % IJ SOLN
30.0000 mL | Freq: Once | INTRAMUSCULAR | Status: DC
Start: 2020-10-10 — End: 2020-10-10

## 2020-10-10 MED ORDER — NIFEDIPINE 10 MG PO CAPS
10.0000 mg | ORAL_CAPSULE | Freq: Once | ORAL | Status: DC | PRN
Start: 2020-10-10 — End: 2020-10-12

## 2020-10-10 MED ORDER — SODIUM CHLORIDE 0.9 % IV SOLN
6.2500 mg | Freq: Four times a day (QID) | INTRAVENOUS | Status: DC | PRN
Start: 2020-10-10 — End: 2020-10-12

## 2020-10-10 MED ORDER — METOCLOPRAMIDE HCL 5 MG/ML IJ SOLN
10.0000 mg | Freq: Once | INTRAMUSCULAR | Status: DC | PRN
Start: 2020-10-10 — End: 2020-10-10

## 2020-10-10 MED ORDER — METHYLERGONOVINE MALEATE 0.2 MG/ML IJ SOLN
0.2000 mg | Freq: Four times a day (QID) | INTRAMUSCULAR | Status: DC | PRN
Start: 2020-10-10 — End: 2020-10-12

## 2020-10-10 MED ORDER — DIPHENHYDRAMINE HCL 50 MG/ML IJ SOLN
12.5000 mg | Freq: Once | INTRAMUSCULAR | Status: DC
Start: 2020-10-10 — End: 2020-10-12
  Filled 2020-10-10: qty 1

## 2020-10-10 MED ORDER — AMMONIA AROMATIC IN INHA
1.0000 | Freq: Once | RESPIRATORY_TRACT | Status: DC | PRN
Start: 2020-10-10 — End: 2020-10-12

## 2020-10-10 MED ORDER — ELITE-OB 50-1.25 MG PO TABS
1.0000 | ORAL_TABLET | Freq: Every day | ORAL | Status: DC
Start: 2020-10-10 — End: 2020-10-12
  Administered 2020-10-11 – 2020-10-12 (×2): 1 via ORAL
  Filled 2020-10-10 (×2): qty 1

## 2020-10-10 MED ORDER — LACTATED RINGERS IV BOLUS
1000.0000 mL | Freq: Once | INTRAVENOUS | Status: DC
Start: 2020-10-10 — End: 2020-10-10

## 2020-10-10 MED ORDER — IBUPROFEN 600 MG PO TABS
600.0000 mg | ORAL_TABLET | Freq: Once | ORAL | Status: DC | PRN
Start: 2020-10-10 — End: 2020-10-12

## 2020-10-10 MED ORDER — WITCH HAZEL EX PADS (WRAP)
1.0000 | MEDICATED_PAD | CUTANEOUS | Status: DC | PRN
Start: 2020-10-10 — End: 2020-10-12
  Filled 2020-10-10: qty 40

## 2020-10-10 MED ORDER — OXYCODONE HCL 5 MG PO TABS
5.0000 mg | ORAL_TABLET | ORAL | Status: DC | PRN
Start: 2020-10-10 — End: 2020-10-12

## 2020-10-10 MED ORDER — SODIUM CHLORIDE 0.9 % IV SOLN
INTRAVENOUS | Status: DC | PRN
Start: 2020-10-10 — End: 2020-10-10
  Administered 2020-10-10 (×2): 7 mL via EPIDURAL

## 2020-10-10 MED ORDER — NIFEDIPINE 10 MG PO CAPS
20.0000 mg | ORAL_CAPSULE | Freq: Once | ORAL | Status: DC | PRN
Start: 2020-10-10 — End: 2020-10-12

## 2020-10-10 MED ORDER — HYDROCORTISONE 1 % EX OINT
TOPICAL_OINTMENT | Freq: Three times a day (TID) | CUTANEOUS | Status: DC | PRN
Start: 2020-10-10 — End: 2020-10-12

## 2020-10-10 MED ORDER — METHYLERGONOVINE MALEATE 0.2 MG/ML IJ SOLN
0.2000 mg | INTRAMUSCULAR | Status: DC | PRN
Start: 2020-10-10 — End: 2020-10-12

## 2020-10-10 MED ORDER — FENTANYL-BUPIVACAINE-NACL 0.2-0.125-0.9 MG/100ML-% EP SOLN
EPIDURAL | Status: DC
Start: 2020-10-10 — End: 2020-10-12
  Administered 2020-10-10: 10 mL/h via EPIDURAL
  Filled 2020-10-10: qty 100

## 2020-10-10 MED ORDER — DIPHENHYDRAMINE HCL 50 MG/ML IJ SOLN
12.5000 mg | Freq: Once | INTRAMUSCULAR | Status: AC
Start: 2020-10-10 — End: 2020-10-10
  Administered 2020-10-10: 12.5 mg via INTRAVENOUS
  Filled 2020-10-10: qty 1

## 2020-10-10 MED ORDER — PROMETHAZINE HCL 12.5 MG PO TABS
12.5000 mg | ORAL_TABLET | Freq: Four times a day (QID) | ORAL | Status: DC | PRN
Start: 2020-10-10 — End: 2020-10-12

## 2020-10-10 MED ORDER — ACETAMINOPHEN 325 MG PO TABS
650.0000 mg | ORAL_TABLET | ORAL | Status: DC | PRN
Start: 2020-10-10 — End: 2020-10-12
  Administered 2020-10-10 – 2020-10-11 (×2): 650 mg via ORAL
  Filled 2020-10-10 (×3): qty 2

## 2020-10-10 MED ORDER — MEASLES, MUMPS & RUBELLA VAC IJ SOLR
0.5000 mL | INTRAMUSCULAR | Status: DC | PRN
Start: 2020-10-10 — End: 2020-10-12

## 2020-10-10 MED ORDER — IBUPROFEN 600 MG PO TABS
600.0000 mg | ORAL_TABLET | Freq: Four times a day (QID) | ORAL | Status: DC
Start: 2020-10-10 — End: 2020-10-12
  Administered 2020-10-11 – 2020-10-12 (×6): 600 mg via ORAL
  Filled 2020-10-10 (×6): qty 1

## 2020-10-10 NOTE — H&P (Signed)
[facility] AND DELIVERY ADMISSION H&P    Date Time: 06/24/227:14 AM  Room#W121/W121.01      Chief Complaint   Patient presents with    Scheduled Induction       Subjective:  Judy Aguirre is a 40 yo [id number] at 39.1 wks admitted overnight for IOL for AMA. No bleeding, pain or LOF. Mild itching. No issues with breathing/swallowing     Past Medical History:   Diagnosis Date    Anemia     iron supplement during third trimester    Breast disorder 2019    benign lump left breast    Breast lump 10/27/2017    Hyperlipidemia     Seasonal allergic rhinitis        Objective:    Vital Signs:  Temp:  [98.2 F (36.8 C)-98.7 F (37.1 C)] 98.7 F (37.1 C)  Heart Rate:  [81-88] 81  Resp Rate:  [17-18] 18  BP: (91-107)/(54-59) 91/54      Review of Systems:   General appearance - alert, well appearing, and in no distress  Heart - normal rate  Abdomen - soft, nontender, gravid  Neurological - alert, oriented, normal speech, no focal findings or movement disorder noted  Extremities - no pedal edema, no clubbing or cyanosis  Skin - normal coloration and turgor, no rashes, no suspicious skin lesions noted    Cervical Exam:  Dilation: 4  Effacement (%): 40  Cervical Characteristics: Posterior  Station: -3  Presentation: Vertex (verified by sono)  Method: Manual  OB Examiner: Dr Hermina Barters    FHT: Baseline Rate: 135 BPM      Labs:     Results       Procedure Component Value Units Date/Time    Gonococcus culture [[id number]] Resulted: 09/25/20    Specimen: Culturette Updated: 10/10/20 0438     Chlamydia trachomatis Culture Negative     Culture Gonorrhoeae Negative    Hepatitis B (HBV) Surface Antigen w/ Reflex to Confirmation [[id number]] Resulted: 09/22/20    Specimen: Blood Updated: 10/10/20 0422     Hepatitis B Surface Antigen Negative    RPR [[id number]] Resulted: 03/03/20    Specimen: Blood Updated: 10/10/20 0422     RPR Nonreactive    GBS Transcribed [[id number]] Resulted: 09/22/20     Updated: 10/10/20 0421     GBS  Transcribed Positive    Type and Screen [[id number]] Collected: 10/09/20 2158    Specimen: Blood Updated: 10/09/20 2315     ABO Rh B POS     AB Screen Gel NEG    CBC and differential [[id number]] Collected: 10/09/20 2158    Specimen: Blood Updated: 10/09/20 2225     WBC 8.49 x10 3/uL      Hgb 12.9 g/dL      Hematocrit 34.7 %      Platelets 251 x10 3/uL      RBC 4.15 x10 6/uL      MCV 91.6 fL      MCH 31.1 pg      MCHC 33.9 g/dL      RDW 14 %      MPV 9.6 fL      Neutrophils 70.3 %      Lymphocytes Automated 20.7 %      Monocytes 7.4 %      Eosinophils Automated 0.8 %      Basophils Automated 0.2 %      Immature Granulocytes 0.6 %      Nucleated RBC 0.0 /100  WBC      Neutrophils Absolute 5.96 x10 3/uL      Lymphocytes Absolute Automated 1.76 x10 3/uL      Monocytes Absolute Automated 0.63 x10 3/uL      Eosinophils Absolute Automated 0.07 x10 3/uL      Basophils Absolute Automated 0.02 x10 3/uL      Immature Granulocytes Absolute 0.05 x10 3/uL      Absolute NRBC 0.00 x10 3/uL             Allergies   Allergen Reactions    Penicillins Other (See Comments) and Rash     Has patient had a PCN reaction causing immediate rash, facial/tongue/throat swelling, SOB or lightheadedness with hypotension: Yes  Has patient had a PCN reaction causing severe rash involving mucus membranes or skin necrosis: No  Has patient had a PCN reaction that required hospitalization: Yes  Has patient had a PCN reaction occurring within the last 10 years: Yes  If all of the above answers are "NO", then may proceed with Cephalosporin use.  Was told by parent she was allergic as child       Current Facility-Administered Medications   Medication Dose Route Frequency Provider Last Rate Last Admin    acetaminophen (TYLENOL) tablet 650 mg  650 mg Oral Q6H PRN Prince Solian, MD        Or    acetaminophen (TYLENOL) suppository 650 mg  650 mg Rectal Q6H PRN Prince Solian, MD        diphenhydrAMINE (BENADRYL) capsule 25 mg  25 mg Oral Q6H PRN Prince Solian, MD         famotidine (PEPCID) injection 20 mg  20 mg Intravenous Q12H [facility] Prince Solian, MD        Or    famotidine (PEPCID) tablet 20 mg  20 mg Oral Q12H [facility] Prince Solian, MD        lactated ringers infusion   Intravenous Continuous Lawson Radar, MD 125 mL/hr at 10/10/20 0231 New Bag at 10/10/20 0231    miSOPROStol (CYTOTEC) quarter tablet 25 mcg  25 mcg Oral Q4H PRN Prince Solian, MD   25 mcg at 10/10/20 0653    naloxone (NARCAN) injection 0.2 mg  0.2 mg Intravenous PRN Prince Solian, MD        ondansetron (ZOFRAN-ODT) disintegrating tablet 4 mg  4 mg Oral Q8H PRN Prince Solian, MD        Or    ondansetron (ZOFRAN) injection 4 mg  4 mg Intravenous Q8H PRN Prince Solian, MD        promethazine (PHENERGAN) tablet 12.5 mg  12.5 mg Oral Q6H PRN Prince Solian, MD        Or    promethazine (PHENERGAN) 6.25 mg in sodium chloride 0.9 % 50 mL IVPB  6.25 mg Intravenous Q6H PRN Prince Solian, MD        Or    promethazine (PHENERGAN) suppository 12.5 mg  12.5 mg Rectal Q6H PRN Prince Solian, MD        terbutaline (BRETHINE) injection 0.25 mg  0.25 mg Subcutaneous Once PRN Prince Solian, MD        vancomycin (VANCOCIN) 1000 mg in sodium chloride 0.9 % 250 mL (premix)  1,000 mg Intravenous Q12H Prince Solian, MD   1,000 mg at 10/10/20 0231    zolpidem (AMBIEN) tablet 5 mg  5 mg Oral QHS PRN Prince Solian, MD   5 mg at 10/09/20 2333       OB History  Gravida   8    Para   2    Term   2    Preterm        AB   5    Living   2         SAB   3    IAB   2    Ectopic        Multiple   0    Live Births   1                 Principal Problem:    Encounter for induction of labor      Past Surgical History:   Procedure Laterality Date    D & E, SUCTION N/A 09/10/2016    Procedure: D & E, SUCTION;  Surgeon: Erenest Blank, MD;  Location: [facility];  Service: Gynecology;  Laterality: N/A;    DILATION AND CURETTAGE OF UTERUS      INDUCED ABORTION      x2    LEEP LLETZ  05/21/2013    Procedure: LEEP LLETZ;  Surgeon: Jeanie Cooks, MD;  Location: [facility];  Service: Gynecology;  Laterality: N/A;    VAGINAL DELIVERY  2000          Assessment/Plan:  41 yo [id number] at 39.1 wks admitted overnight for IOL for AMA.     1. IOL  - CE 4/40/-3  - TOCO irregular  - Discussed AOL options  - Amenable to Epidural--> AROM + pit    2. FWB  - 5/2 MFM USG 2141 g (87%), posterior placenta   - Pelvis proven to 7lbs9oz (2021)  - EFM cat I    3. AMA  - cfDNA neg, XX  - NT normal  - Level II normal    4. GBS pos  - PCN allergy (unknown)  - S/p 1 dose of  vanc    Risks, benefits, alternatives and possible complications have been discussed in detail with the patient.  Pre-admission, admission, and post admission procedures and expectations were discussed in detail.  All questions answered.    Lawson Radar, MD

## 2020-10-10 NOTE — Anesthesia Preprocedure Evaluation (Signed)
Anesthesia Evaluation    AIRWAY    Mallampati: III    TM distance: >3 FB  Neck ROM: full  Mouth Opening:full   CARDIOVASCULAR    cardiovascular exam normal       DENTAL    no notable dental hx     PULMONARY    pulmonary exam normal     OTHER FINDINGS    G8P2@ 39wks  Plt 251                Relevant Problems   NEURO/PSYCH   (+) History of preterm delivery               Anesthesia Plan    ASA 2     epidural                     Detailed anesthesia plan: epidural        Post op pain management: per surgeon    informed consent obtained      pertinent labs reviewed             Signed by: Tommy Rainwater, MD 10/10/20 10:32 AM

## 2020-10-10 NOTE — Procedures (Addendum)
VAGINAL DELIVERY NOTE    DELIVERY DATE/TIME: 10/10/20   DELIVERY PERFORMED EX:BMWUXL Judy Aguirre    Mother: 40 y.o. [id number], GBS Status: unknown  Anesthesia: Epidural  Infant: Gender: female   Weight: 8 lb 11.3 oz (3950 g)    APGAR: 8  (1 min), 9  (5 min)     Called to room for increased pelvic pressure and patient desiring to push. Patient was fully dilated and pushed for  minutes. NSVD of viable female infant over intact perineum. loa position. 1x nuchal cord. + meconium. Head was delivered in a controlled fashion and anterior shoulder followed atraumatically. Neonate was placed on the pt's abdomen. Bulb suction used to clear oropharynx and nares. Tactile stimulation performed. Cord clamped and cut. Neonate handed to nursing staff. Cord blood collected. Intact placenta w/ normal 3-vessel cord delivered via gentle traction and suprapubic pressure. Pitocin then given to facilitate uterine contractions. Perineum explored, no laceration repaired. Excellent hemostasis noted. Counts correct x 2. Fundus was palpated and noted to be firm after delivery.    EBL: 150 cc  Complications/Lacerations: none  Condition: Mother and infant stable postpartum    Judy Aguirre

## 2020-10-10 NOTE — Progress Notes (Signed)
Patient complaining of itchy hands after 1st dose of vancomycin. Vancomycin already finished. MD Awad notified. 12.5mg  benadryl IV ordered x1 dose.

## 2020-10-10 NOTE — Plan of Care (Signed)
RN admitted pt to [facility] with infant in arms. Fundus firm, bleeding scant. RN reviewed plan of care and safety with pt, including emergency cord use. RN told pt to call if needed. Pt verbalizes understanding.    Problem: Vaginal/Cesarean Delivery  Goal: Postpartum management of pain/discomfort  Outcome: Progressing  Flowsheets (Taken 10/10/2020 2144)  Postpartum management of pain/discomfort:   Assess pain using a consistent, developmental/age appropriate pain scale   Assess pain level before and following intervention   Include patient/patient care companion in decisions related to pain management   Offer non-pharmacologic pain management interventions  Goal: Uterine management  Outcome: Progressing  Flowsheets (Taken 10/10/2020 2144)  Uterine management:   Assess fundus and notify LIP if not firm, midline, or at or below the umbilicus, or if abdomen is abnormally distended   Assess for hemorrhage risk using appropriate screening tool  Goal: Perineum will be clean, dry, and intact and without discharge or hematoma  Outcome: Progressing  Flowsheets (Taken 10/10/2020 2144)  Perineum will be clean, dry, and intact and without discharge or hematoma:   Place cold pack on perineum   Provide pericare   Sitz bath PRN as ordered by LIP   Monitor wound/incision for signs of infections   Assess for hemorrhoids and provide interventions

## 2020-10-10 NOTE — Plan of Care (Signed)
POC reviewed; questions answered; Dr. Roby Lofts at bedside. Patient states that pain is  0/10 w/ contractions. Pain well managed with epidural. Pain management education reinforced.  Physical assessment WNL; VSS.  FHR WNL.  Pt educated to notify RN if water breaks, bleeding, urge to push, or constant pressure.

## 2020-10-10 NOTE — Anesthesia Procedure Notes (Signed)
Epidural    Patient location during procedure: L&D  Reason for block: Labor or C-section  Block at Surgeon's request: Yes    End time: 10/10/2020 10:50 AM    Staffing  Performed: anesthesiologist   Anesthesiologist: Tommy Rainwater, MD    Pre-procedure Checklist   Completed: patient identified, pre-op evaluation, timeout performed, risks and benefits discussed and anesthesia consent given      Epidural  Patient monitoring: NIBP and Pulse oximetry    Premedication: No and Meaningful Contact Maintained  Patient position: sitting    Skin Local: bupivicaine 0.25%    Attempts  Number of attempts: 1                    Successful attempt  Interspace: L3-4  Approach: midline    Needle type: Touhy needle   Needle gauge: 17  Injection technique: LOR air    CSF Return: No   Blood Return: No  Paresthesia Pain: no and No    Needle Placement  Needle type: Touhy needle   Needle gauge: 17  Injection technique: LOR air  CSF Return: No  Blood Return: No          Paresthesia Pain: no and No    Catheter Placement   Catheter type: end hole  Catheter size: 19 G  Catheter at skin depth: 13 cm  CSF Return: No  Blood Return: No  Test Dose:negative    Incremental injection: yes    No Catheter IV/SA Signs or Symptoms    Assessment   Patient tolerated procedure well: Yes  Block Outcome: patient tolerated procedure well, no complications and pain improved

## 2020-10-11 MED ORDER — IBUPROFEN 600 MG PO TABS
600.0000 mg | ORAL_TABLET | Freq: Four times a day (QID) | ORAL | 0 refills | Status: DC
Start: 2020-10-11 — End: 2022-08-05

## 2020-10-11 NOTE — Discharge Summary (Signed)
Obstetrical Discharge Form    Primary OB Clinician: Dr Lavone Nian     [facility]: Estimated Date of Delivery: 10/16/20    Gestational Age:[redacted]w[redacted]d    Antepartum complications: none    Date of Delivery: 10/10/2020  Time of Delivery:  6:06 PM      Delivered By: Elnoria Howard, [facility]     Delivery Type: Vaginal, Spontaneous     Tubal Ligation: n/a    Baby: Liveborn Female ; Apgar 1 minute: 8  Apgar 5 minute: 9 ; Birth weight: 8 lb 11.3 oz (3950 g)     Anesthesia:     Delivery complications:                                          None     Laceration:   Laceration Type: None      Episiotomy: None     Episiotomy/Laceration Repair: None      Placenta:             Feeding method: breast    Rh Immune globulin given: not applicable    Rubella vaccine given: not applicable    Tdap vaccine given:   on discharge    Discharge Date/Time: No discharge date for patient encounter.    Plan:     Address and phone number verified and same.  Follow-up appointment in 6 weeks if vaginal delivery and 2 weeks if c/section.

## 2020-10-11 NOTE — Plan of Care (Signed)
Problem: Vaginal/Cesarean Delivery  Goal: Postpartum management of pain/discomfort  Outcome: Progressing  Flowsheets (Taken 10/11/2020 0941)  Postpartum management of pain/discomfort:   Assess pain using a consistent, developmental/age appropriate pain scale   Assess pain level before and following intervention   Include patient/patient care companion in decisions related to pain management   Monitor for post anesthesia issues related to pain management     Problem: Vaginal/Cesarean Delivery  Goal: Evidence of positive mother-baby interactions  Outcome: Progressing  Flowsheets (Taken 10/11/2020 0941)  Evidence of positive mother-baby interactions:   Include patient/patient care companion in decisions related to care   Initiate safety and falls prevention interventions   Encourage rooming in and infant feeding on demand   Assess parent/caregiver engagement and awareness of infant cues/behavior   Notify LIP and case management if risk factors are identified   Assess emotional status and coping mechanisms   Ensure parent/caregiver provides infant care   Initiate skin to skin   Pt is doing well. Vital signs stable. Lochia scant. Pt is urinating without difficulty. Fall/safety protocols discussed. Plan of care reviewed. Pt verbalized understanding.

## 2020-10-11 NOTE — Lactation Note (Signed)
Initial Lactation Visit:  [id number]  Delivered: 10/10/2020  6:06 PM   Female  via Vaginal, Spontaneous  at [redacted]w[redacted]d    Birth Weight: 8 lb 11.3 oz (3950 g)   Feeding Type:  Formula   APGAR: 8  and 9     Allergies   Allergen Reactions    Penicillins Other (See Comments) and Rash     Has patient had a PCN reaction causing immediate rash, facial/tongue/throat swelling, SOB or lightheadedness with hypotension: Yes  Has patient had a PCN reaction causing severe rash involving mucus membranes or skin necrosis: No  Has patient had a PCN reaction that required hospitalization: Yes  Has patient had a PCN reaction occurring within the last 10 years: Yes  If all of the above answers are "NO", then may proceed with Cephalosporin use.  Was told by parent she was allergic as child     Past Medical History:   Diagnosis Date    Anemia     iron supplement during third trimester    Breast disorder 2019    benign lump left breast    Breast lump 10/27/2017    Hyperlipidemia     Seasonal allergic rhinitis      Past Surgical History:   Procedure Laterality Date    D & E, SUCTION N/A 09/10/2016    Procedure: D & E, SUCTION;  Surgeon: Erenest Blank, MD;  Location: [facility];  Service: Gynecology;  Laterality: N/A;    DILATION AND CURETTAGE OF UTERUS      INDUCED ABORTION      x2    LEEP LLETZ  05/21/2013    Procedure: LEEP LLETZ;  Surgeon: Jeanie Cooks, MD;  Location: [facility];  Service: Gynecology;  Laterality: N/A;    VAGINAL DELIVERY  2000       Initial lactation consult   Breastfeeding basics reviewed.  Mom endorses comfortable latches, regular feedings, and appropriate output.  She has been supplementing baby with formula per choice.    Encouraged to practice skin to skin contact and offer breast with all hunger cues (at least 8-12 times per 24 hours).    Watch baby for signs of effective feedings (adequate number of voids, stools and minimal weight loss). Avoid pacifiers and formula supplements unless it is medically  indicated.   Contact number given - Patient to call for questions or assistance as needed.    Follow up PRN.

## 2020-10-11 NOTE — Progress Notes (Signed)
OB POSTPARTUM ROUNDING NOTE    Subjective:  Delivery Type: Vaginal   Minimal Bleeding, breastfeeding well.    Objective:  Vital Signs Stable; afebrile; breast soft, non tender; fundus firm, non tender;       Assessment/Plan:  Stable - Postpartum Day 1  Routine care, discharge in  today day(s), F/U in 2 weeks for a cesarean delivery and 6 weeks for vaginal delivery.  Prescriptions given: Motrin

## 2020-10-12 NOTE — Plan of Care (Signed)
Problem: Vaginal/Cesarean Delivery  Goal: Postpartum management of pain/discomfort  Outcome: Progressing  Flowsheets (Taken 10/12/2020 0605)  Postpartum management of pain/discomfort:   Assess pain using a consistent, developmental/age appropriate pain scale   Assess pain level before and following intervention   Include patient/patient care companion in decisions related to pain management   Offer non-pharmacologic pain management interventions  Goal: Uterine management  Outcome: Progressing  Flowsheets (Taken 10/12/2020 0605)  Uterine management:   Assess fundus and notify LIP if not firm, midline, or at or below the umbilicus, or if abdomen is abnormally distended   Assess for hemorrhage risk using appropriate screening tool  Goal: Perineum will be clean, dry, and intact and without discharge or hematoma  Outcome: Progressing  Flowsheets (Taken 10/12/2020 0605)  Perineum will be clean, dry, and intact and without discharge or hematoma:   Place cold pack on perineum   Provide pericare   Sitz bath PRN as ordered by LIP   Monitor wound/incision for signs of infections   Assess for hemorrhoids and provide interventions     Plan of care and safety reviewed with pt. Fundus firm, bleeding scant. Ambulating and voiding independently. Pt is breastfeeding and formula feeding infant. Pain well controlled with ibuprofen, RN told pt to call if she wants tylenol. RN told pt to call if needed. Pt verbalizes understanding

## 2020-10-12 NOTE — Plan of Care (Signed)
Pt is doing well. Vital signs stable. Lochia scant. Pt urinating without difficult. Pain controlled with mother. Sitz bath instructions completed.s/s of infection, Postpartum depression and anxiety, PIH reviewed. Pt verbalized understanding. Pt discharge in a stable condition

## 2020-10-13 ENCOUNTER — Encounter (HOSPITAL_BASED_OUTPATIENT_CLINIC_OR_DEPARTMENT_OTHER): Payer: Self-pay | Admitting: Obstetrics & Gynecology

## 2020-10-13 NOTE — UM Notes (Signed)
Vaginal Delivery    10/09/2020 Observation  Care day#1  L&D  HPI: 39y.o G8P2 at [redacted]w[redacted]d for IOL.  Labor management    10/10/2020 Changed to Inpatient  Care day #2    Continued labor     OR Procedure: Vaginal delivery  Gestational age: [redacted]w[redacted]d  Sex: female  APGARS: 8/9  Birth weight: 1610R  Disposition: newborn nursery    Post partum care      Discharged home 10/12/2020      Completed by: Melvyn Novas, RN  [facility]  Utilization Review  Case Management Office:  Ph: [phone]  Fax: [phone]  Please use fax number to provide authorization for hospital services or to request additional information

## 2020-10-28 NOTE — Anesthesia Postprocedure Evaluation (Signed)
Anesthesia Post Evaluation    Patient: Judy Aguirre    * No procedures listed *    Anesthesia type: epidural                    Anesthesia Post Evaluation   No problems noted.    Signed by: Tommy Rainwater, MD, 10/28/2020 4:12 PM

## 2020-12-19 ENCOUNTER — Other Ambulatory Visit (INDEPENDENT_AMBULATORY_CARE_PROVIDER_SITE_OTHER): Payer: Self-pay

## 2021-12-23 ENCOUNTER — Ambulatory Visit (INDEPENDENT_AMBULATORY_CARE_PROVIDER_SITE_OTHER): Payer: Commercial Managed Care - POS | Admitting: Family

## 2021-12-23 ENCOUNTER — Encounter (INDEPENDENT_AMBULATORY_CARE_PROVIDER_SITE_OTHER): Payer: Self-pay

## 2021-12-23 VITALS — BP 98/65 | HR 66 | Temp 97.9°F | Resp 18 | Ht 67.0 in | Wt 162.0 lb

## 2021-12-23 DIAGNOSIS — S299XXA Unspecified injury of thorax, initial encounter: Secondary | ICD-10-CM

## 2021-12-23 DIAGNOSIS — S20212A Contusion of left front wall of thorax, initial encounter: Secondary | ICD-10-CM

## 2021-12-23 MED ORDER — METHOCARBAMOL 750 MG PO TABS
750.0000 mg | ORAL_TABLET | Freq: Three times a day (TID) | ORAL | 0 refills | Status: AC
Start: 2021-12-23 — End: 2021-12-30

## 2021-12-23 NOTE — Progress Notes (Signed)
[facility]  PROGRESS NOTE     Patient: Judy Aguirre   Date: 12/23/2021   MRN: [id number]       Jesika Men is a 41 y.o. female      HISTORY     History obtained from: Patient    Chief Complaint   Patient presents with    Rib Injury     Larey Seat and injured left ribs        HPI     History: A 41 y.o.f presented with c/o rib injury that happened today due to fall. Deep breathing causes a lot of pain for her. She has two small children at home that she needs to carry.   Denies fever, chills, SOB, abd pain or N/V.   Location:see history  Quality:see history  Severity:mild  Duration:see history  Timing:constant  Context /Modifying Factors: see history  Associated symptoms:see history          Review of Systems  ROS all other negative except pertinent positives in HPI    History:    Pertinent Past Medical, Surgical, Family and Social History were reviewed.        Current Outpatient Medications:     ibuprofen (ADVIL) 600 MG tablet, Take 1 tablet (600 mg total) by mouth every 6 (six) hours, Disp: 40 tablet, Rfl: 0    methocarbamol (ROBAXIN) 750 MG tablet, Take 1 tablet (750 mg) by mouth 3 (three) times daily for 7 days, Disp: 21 tablet, Rfl: 0    Prenatal MV-Min-Fe Fum-FA-DHA (PRENATAL 1 PO), Take by mouth, Disp: , Rfl:     Allergies   Allergen Reactions    Penicillins Other (See Comments) and Rash     Has patient had a PCN reaction causing immediate rash, facial/tongue/throat swelling, SOB or lightheadedness with hypotension: Yes  Has patient had a PCN reaction causing severe rash involving mucus membranes or skin necrosis: No  Has patient had a PCN reaction that required hospitalization: Yes  Has patient had a PCN reaction occurring within the last 10 years: Yes  If all of the above answers are "NO", then may proceed with Cephalosporin use.  Was told by parent she was allergic as child       Medications and Allergies reviewed.    PHYSICAL EXAM     Vitals:    12/23/21 1149   BP: 98/65   BP Site:  Right arm   Patient Position: Sitting   Cuff Size: Medium   Pulse: 66   Resp: 18   Temp: 97.9 F (36.6 C)   SpO2: 98%   Weight: 73.5 kg (162 lb)   Height: 1.702 m (5\' 7" )       Physical Exam  Constitutional:       General: She is not in acute distress.     Appearance: She is well-developed.      Comments: Afebrile    HENT:      Head: Normocephalic and atraumatic.     Eyes: Conjunctivae are normal. Cardiovascular:      Rate and Rhythm: Normal rate and regular rhythm.   Pulmonary:      Effort: Pulmonary effort is normal.      Breath sounds: Normal breath sounds.   Musculoskeletal:         General: Tenderness present.      Comments: Left rib cage area with tenderness   No bruises, erythema or warmth noted   Pain starts at 4th ribs down.  Neurological:      Mental Status: She is alert and oriented to person, place, and time.   Skin:     General: Skin is warm.   Vitals and nursing note reviewed.         UCC COURSE     There were no labs reviewed with this patient during the visit.    There were no x-rays reviewed with this patient during the visit.    No current facility-administered medications for this visit.       PROCEDURES     Procedures    MEDICAL DECISION MAKING     History, physical, labs/studies most consistent with         Rib injury due to fall as the diagnosis.  Past Medical History:   Diagnosis Date    Anemia     iron supplement during third trimester    Breast disorder 2019    benign lump left breast    Breast lump 10/27/2017    Hyperlipidemia     Seasonal allergic rhinitis      Past Surgical History:   Procedure Laterality Date    D & E, SUCTION N/A 09/10/2016    Procedure: D & E, SUCTION;  Surgeon: Erenest Blank, MD;  Location: [facility];  Service: Gynecology;  Laterality: N/A;    DILATION AND CURETTAGE OF UTERUS      INDUCED ABORTION      x2    LEEP LLETZ  05/21/2013    Procedure: LEEP LLETZ;  Surgeon: Jeanie Cooks, MD;  Location: [facility];  Service: Gynecology;  Laterality: N/A;     VAGINAL DELIVERY  2000       Chart Review:  Prior PCP, Specialist and/or ED notes reviewed today: yes  Prior labs/images/studies reviewed today: Yes    Differential Diagnosis: Rib cage injury, Muscle strain, Contusion to her chest,       ASSESSMENT     Encounter Diagnoses   Name Primary?    Rib injury Yes    Contusion of left chest wall, initial encounter             PLAN      PLAN:   A 40 y.o.f presented with c/o left rib cage injury since yesterday when she fell.  Xray of left rib with PA chest given   Advised to take NSAIDs  Hold on to a pillow before laughing, coughing, or sneezing.   All pt's questions answered. D/c instruction reviewed with pt. Pt acknowledged understanding.         No orders of the defined types were placed in this encounter.    Requested Prescriptions     Signed Prescriptions Disp Refills    methocarbamol (ROBAXIN) 750 MG tablet 21 tablet 0     Sig: Take 1 tablet (750 mg) by mouth 3 (three) times daily for 7 days       Discussed results and diagnosis with patient/family.  Reviewed warning signs for worsening condition, as well as, indications for follow-up with primary care physician and return to urgent care clinic.   Patient/family expressed understanding of instructions.     An After Visit Summary was provided to the patient.

## 2021-12-23 NOTE — Patient Instructions (Addendum)
Apply ice pack   Avoid carrying your babies, lifting, pushing or pulling   Hold on to a pillow before coughing, sneezing, laughing, or deep breathing.   Take Motrin as needed for pain   Methocarbamol 750 mg every 8 hours as needed   Follow up with your pcp if no sx relief

## 2021-12-27 ENCOUNTER — Emergency Department: Payer: Commercial Managed Care - POS

## 2021-12-27 ENCOUNTER — Emergency Department
Admission: EM | Admit: 2021-12-27 | Discharge: 2021-12-27 | Disposition: A | Discharge status: Home / Self Care | Payer: Commercial Managed Care - POS | Attending: Emergency Medicine | Admitting: Emergency Medicine

## 2021-12-27 DIAGNOSIS — S90129A Contusion of unspecified lesser toe(s) without damage to nail, initial encounter: Secondary | ICD-10-CM

## 2021-12-27 DIAGNOSIS — S90112A Contusion of left great toe without damage to nail, initial encounter: Secondary | ICD-10-CM | POA: Insufficient documentation

## 2021-12-27 DIAGNOSIS — W109XXA Fall (on) (from) unspecified stairs and steps, initial encounter: Secondary | ICD-10-CM | POA: Insufficient documentation

## 2021-12-27 LAB — POCT PREGNANCY TEST, URINE HCG: POCT Pregnancy HCG Test, UR: NEGATIVE

## 2021-12-27 MED ORDER — IBUPROFEN 600 MG PO TABS
600.0000 mg | ORAL_TABLET | Freq: Once | ORAL | Status: AC
Start: 2021-12-27 — End: 2021-12-27
  Administered 2021-12-27: 600 mg via ORAL
  Filled 2021-12-27: qty 1

## 2021-12-27 MED ORDER — NAPROXEN 500 MG PO TABS
500.0000 mg | ORAL_TABLET | Freq: Two times a day (BID) | ORAL | 0 refills | Status: DC
Start: 2021-12-27 — End: 2022-08-05

## 2021-12-27 NOTE — ED Provider Notes (Signed)
[facility] EMERGENCY DEPARTMENT H&P         CLINICAL SUMMARY          Diagnosis:    .     Final diagnoses:   None         MDM Notes:    Medical Decision Making  Amount and/or Complexity of Data Reviewed  Labs: ordered.  Radiology: ordered.    Risk  Prescription drug management.             Disposition:      {~~Select at end of visit~~:37410}              CLINICAL INFORMATION        HPI:      Chief Complaint: Fall, Toe Pain, and Rib pain  .    Judy Aguirre is a 41 y.o. female with a past medical history of anemia and hyperlipidemia who presents with pain in her left foot and bruising on her left great toe after falling down stairs yesterday. Patient denies any LOC, nausea, ankle pain, or pain in other areas. Patient also fell earlier this week on furniture hurting her left chest. She was seen at urgent care and her X-ray is clear.     History obtained from: Patient      ROS:      Positive and negative ROS elements as per HPI.  All other systems reviewed and negative.      Physical Exam:      Pulse 84  BP 113/78  Resp 18  SpO2 97 %  Temp 97.5 F (36.4 C)      Constitutional: Vital signs reviewed. Well appearing.  Head: Normocephalic, atraumatic  Eyes: No conjunctival injection. No discharge.  ENT: Mucous membranes moist  Neck: Normal range of motion. Trachea midline.  Respiratory/Chest: clear to auscultation. Slight ecchymosis, yellowing, L mid axillary line at 12th rib  Cardiovascular: Regular rate and rhythm. No murmur.   Lower Extremities: No edema. +marked ecchymosis and slight swelling L great toe; some tenderness on dorsal aspect of mid foot; no ankle pain to palpation  Neurological: No focal motor deficits by observation. Speech normal. Memory normal.  Skin: Warm and dry. No rash.              PAST HISTORY        Primary Care Provider: Sandrea Matte, DO        PMH/PSH:    .     Past Medical History:   Diagnosis Date    Anemia     iron supplement during third  trimester    Breast disorder 2019    benign lump left breast    Breast lump 10/27/2017    Hyperlipidemia     Seasonal allergic rhinitis        She has a past surgical history that includes Vaginal delivery (2000); Induced abortion; LEEP LLETZ (05/21/2013); D & E, SUCTION (N/A, 09/10/2016); and Dilation and curettage of uterus.      Social/Family History:      She reports that she has quit smoking. Her smoking use included cigarettes. She has quit using smokeless tobacco. She reports that she does not drink alcohol and does not use drugs.    Family History   Problem Relation Age of Onset    Breast cancer Mother 23    Cancer Mother     No known problems Father     Breast cancer Paternal Aunt 27  Paternal half-aunt    Cancer Paternal Aunt     Breast cancer Cousin 54        Paternal half-cousin; Pt reports genetic testing was negative, report not reviewed    No known problems Son     Ovarian cancer Neg Hx          Listed Medications on Arrival:    .     Previous Medications    IBUPROFEN (ADVIL) 600 MG TABLET    Take 1 tablet (600 mg total) by mouth every 6 (six) hours    METHOCARBAMOL (ROBAXIN) 750 MG TABLET    Take 1 tablet (750 mg) by mouth 3 (three) times daily for 7 days    PRENATAL MV-MIN-FE FUM-FA-DHA (PRENATAL 1 PO)    Take by mouth      Allergies: She is allergic to penicillins.            VISIT INFORMATION        Clinical Course in the ED:             Medications Given in the ED:    .     ED Medication Orders (From admission, onward)      Start Ordered     Status Ordering Provider    12/27/21 1221 12/27/21 1220  ibuprofen (ADVIL) tablet 600 mg  Once        Route: Oral  Ordered Dose: 600 mg       Acknowledged TRUE [place]              Procedures:      Procedures      Interpretations:      Radiology:  I reviewed and independently interpreted the following radiology studies: Xray of the left foot and left toe showing no acute fracture.             RESULTS        Lab Results:      Results       ** No results  found for the last 24 hours. **                Radiology Results:      Toes Left 2 + Views    (Results Pending)   Foot Left AP Lateral And Oblique    (Results Pending)               Scribe Attestation:   I was acting as a Neurosurgeon for Debbora Dus, MD on Badalamenti,Avyanna ELIZABETH  Sreya Mallipeddi    I am the first provider for this patient and I personally performed the services documented. Sreya Mallipeddi is scribing for me on [employer]. This note accurately reflects work and decisions made by me.  Debbora Dus, MD

## 2021-12-27 NOTE — ED Triage Notes (Signed)
Pt states left great toe pain after falling  Yesterday, states she slipped going down stairs  Pt ambulated to er chair

## 2021-12-30 ENCOUNTER — Telehealth (INDEPENDENT_AMBULATORY_CARE_PROVIDER_SITE_OTHER): Payer: Self-pay | Admitting: Family Medicine

## 2021-12-30 NOTE — Telephone Encounter (Signed)
UNABLE TO CONTACT PATIENT FOR FOLLOW UP ED VISIT

## 2022-06-23 ENCOUNTER — Encounter (INDEPENDENT_AMBULATORY_CARE_PROVIDER_SITE_OTHER): Payer: Self-pay | Admitting: Student in an Organized Health Care Education/Training Program

## 2022-06-23 ENCOUNTER — Ambulatory Visit (INDEPENDENT_AMBULATORY_CARE_PROVIDER_SITE_OTHER): Payer: Medicaid Other | Admitting: Student in an Organized Health Care Education/Training Program

## 2022-06-23 VITALS — BP 99/63 | HR 62 | Temp 98.0°F | Wt 154.0 lb

## 2022-06-23 DIAGNOSIS — Z1239 Encounter for other screening for malignant neoplasm of breast: Secondary | ICD-10-CM

## 2022-06-23 DIAGNOSIS — R634 Abnormal weight loss: Secondary | ICD-10-CM | POA: Insufficient documentation

## 2022-06-23 DIAGNOSIS — E049 Nontoxic goiter, unspecified: Secondary | ICD-10-CM

## 2022-06-23 NOTE — Progress Notes (Signed)
[facility]                       Date of Exam: 06/23/2022 10:51 AM        Patient ID: Judy Aguirre is a 42 y.o. female.  Attending Physician: Norberto Sorenson, DO        Chief Complaint:    Chief Complaint   Patient presents with    Dizziness     Started 3 days ago               HPI:    HPI  - Here for acute visit. Concerned about week long head congestion as her kids recently were having URI symptoms and is also concerned about unintentional weight loss of about 30-40 pounds in the last year. Denies any black or bloody stool, diarrhea, night sweats. States she's UTD on her pap smear but is unsure and is due for her mammogram. Denies any famhx of colon cancer.               Problem List:    Patient Active Problem List   Diagnosis    High grade squamous intraepithelial cervical dysplasia    History of preterm delivery    HSV-2 seropositive    Elderly multigravida, third trimester    Cystic hygroma    Breast lump    Abnormal findings on diagnostic imaging of breast    Family history of malignant neoplasm of breast    Vaginal discharge during pregnancy in third trimester    Encounter for screening for malignant neoplasm of breast, unspecified screening modality    Unintentional weight loss    Enlarged thyroid             Current Meds:    Outpatient Medications Marked as Taking for the 06/23/22 encounter (Office Visit) with Ennis Heavner H, DO   Medication Sig Dispense Refill    ibuprofen (ADVIL) 600 MG tablet Take 1 tablet (600 mg total) by mouth every 6 (six) hours 40 tablet 0          Allergies:    Allergies   Allergen Reactions    Penicillins Other (See Comments) and Rash     Has patient had a PCN reaction causing immediate rash, facial/tongue/throat swelling, SOB or lightheadedness with hypotension: Yes  Has patient had a PCN reaction causing severe rash involving mucus membranes or skin necrosis: No  Has patient had a PCN reaction that required hospitalization:  Yes  Has patient had a PCN reaction occurring within the last 10 years: Yes  If all of the above answers are "NO", then may proceed with Cephalosporin use.  Was told by parent she was allergic as child             Past Surgical History:    Past Surgical History:   Procedure Laterality Date    D & E, SUCTION N/A 09/10/2016    Procedure: D & E, SUCTION;  Surgeon: Peri Maris, MD;  Location: [facility];  Service: Gynecology;  Laterality: N/A;    DILATION AND CURETTAGE OF UTERUS      INDUCED ABORTION      x2    LEEP LLETZ  05/21/2013    Procedure: LEEP LLETZ;  Surgeon: Meriam Sprague, MD;  Location: [facility];  Service: Gynecology;  Laterality: N/A;    VAGINAL DELIVERY  2000           Family History:  Family History   Problem Relation Age of Onset    Breast cancer Mother 11    Cancer Mother     No known problems Father     Breast cancer Paternal Aunt 87        Paternal half-aunt    Cancer Paternal Aunt     Breast cancer Cousin 51        Paternal half-cousin; Pt reports genetic testing was negative, report not reviewed    No known problems Son     Ovarian cancer Neg Hx            Social History:    Social History     Tobacco Use    Smoking status: Former     Years: 3     Types: Cigarettes    Smokeless tobacco: Former    Tobacco comments:     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 Negative for chills, fatigue and fever.   HENT:  Positive for congestion. Negative for sinus pressure, sinus pain and  sneezing.    Respiratory:  Negative for chest tightness, shortness of breath, wheezing and stridor.    Cardiovascular:  Negative for chest pain.   Gastrointestinal:  Negative for abdominal distention, abdominal pain, anal bleeding, blood in stool, constipation, diarrhea, nausea and vomiting.   Genitourinary:  Negative for decreased urine volume, difficulty urinating and vaginal bleeding.   Skin:  Negative for color change, pallor, rash and wound.   Neurological:  Negative for dizziness, syncope and weakness.                Physical Exam:    Physical Exam  Constitutional:       General: She is not in acute distress.     Appearance: Normal appearance. She is normal weight. She is not ill-appearing or toxic-appearing.   HENT:      Head: Normocephalic and atraumatic.      Right Ear: External ear normal.      Left Ear: External ear normal.      Nose: Nose normal.      Mouth/Throat:      Mouth: Mucous membranes are dry.      Pharynx: No posterior oropharyngeal erythema.      Comments: Increased posterior drainage  Eyes:      General: No scleral icterus.     Conjunctiva/sclera: Conjunctivae normal.   Neck:      Thyroid: Thyromegaly present. No thyroid tenderness.   Cardiovascular:      Rate and Rhythm: Normal rate and regular rhythm.      Heart sounds: No murmur heard.     No friction rub. No gallop.  Pulmonary:      Effort: Pulmonary effort is normal. No respiratory distress.      Breath sounds: Normal breath sounds. No wheezing or rales.   Abdominal:      General: There is no distension.      Palpations: Abdomen is soft.      Tenderness: There is no abdominal tenderness. There is no guarding.   Skin:     General: Skin is warm and dry.   Neurological:      General: No focal deficit present.      Mental Status: She is alert and oriented to person, place, and time.                  Assessment:    1. Unintentional weight loss  - Mammo Digital Screening Bilateral W CAD  - CBC without differential  - Basic Metabolic Panel  -  TSH, Abn Reflex to Free T4, Serum  - HIV-1/2 Ag/Ab 4th Gen. w/ Reflex  - Hepatitis C (HCV) antibody, Total  - Hepatic function panel (LFT)  - Ferritin  - Hemoglobin A1C    2. Encounter for screening for malignant neoplasm of breast, unspecified screening modality  - Mammo Digital Screening Bilateral W CAD    3. Enlarged thyroid  - [place] Head Neck Soft Tissue            Plan:    Weight loss  - will initiate broad workup including CBC, BMP, ferritin, LFTs, HgbA1C, TSH, HIV,   - encouaged pt to obtain UTD pap smear records  - mammogram ordered   - may consider further imaging if symptoms persist    ?Enlarged Thyroid  - U/S ordered, noted that previous U/S only noted the R region and not both regions   - TSH also ordered             Follow-up:    Return if symptoms worsen or fail to improve.         Baleria Wyman H Williom Cedar, DO

## 2022-06-23 NOTE — Progress Notes (Signed)
Have you seen any specialists/other providers since your last visit with [place]?    No    Arm preference verified?   Yes, no preference    Health Maintenance Due   Topic Date Due    Tetanus Ten-Year  Never done    COVID-19 Vaccine (1) Never done    HEPATITIS C SCREENING  Never done    PAP SMEAR  05/17/2019    MAMMOGRAM  06/12/2020    DEPRESSION SCREENING  11/21/2020    INFLUENZA VACCINE  11/17/2021

## 2022-06-24 LAB — BASIC METABOLIC PANEL
BUN / Creatinine Ratio: 10 (ref 9–23)
BUN: 8 mg/dL (ref 6–24)
CO2: 24 mmol/L (ref 20–29)
Calcium: 9.1 mg/dL (ref 8.7–10.2)
Chloride: 98 mmol/L (ref 96–106)
Creatinine: 0.78 mg/dL (ref 0.57–1.00)
Glucose: 66 mg/dL — ABNORMAL LOW (ref 70–99)
Potassium: 4.2 mmol/L (ref 3.5–5.2)
Sodium: 138 mmol/L (ref 134–144)
eGFR: 98 mL/min/{1.73_m2} (ref >59)

## 2022-06-24 LAB — CBC
Hematocrit: 43.8 % (ref 34.0–46.6)
Hemoglobin: 14.8 g/dL (ref 11.1–15.9)
MCH: 29.7 pg (ref 26.6–33.0)
MCHC: 33.8 g/dL (ref 31.5–35.7)
MCV: 88 fL (ref 79–97)
Platelets: 271 10*3/uL (ref 150–450)
RBC: 4.98 x10E6/uL (ref 3.77–5.28)
RDW: 13 % (ref 11.7–15.4)
WBC: 4.2 10*3/uL (ref 3.4–10.8)

## 2022-06-24 LAB — HEPATIC FUNCTION PANEL (LFT)
ALT: 15 IU/L (ref 0–32)
AST (SGOT): 18 IU/L (ref 0–40)
Albumin: 4.5 g/dL (ref 3.9–4.9)
Alkaline Phosphatase: 50 IU/L (ref 44–121)
Bilirubin Direct: 0.1 mg/dL (ref 0.00–0.40)
Bilirubin, Total: 0.3 mg/dL (ref 0.0–1.2)
Protein, Total: 6.9 g/dL (ref 6.0–8.5)

## 2022-06-24 LAB — HIV-1/2, ANTIGEN AND ANTIBODY WITH REFLEX TO CONFIRMATION: HIV Screen 4th Generation wRfx: NONREACTIVE

## 2022-06-24 LAB — HEPATITIS C ANTIBODY, TOTAL: HCV AB: NONREACTIVE

## 2022-06-24 LAB — HEMOGLOBIN A1C: Hemoglobin A1C: 5.9 % — ABNORMAL HIGH (ref 4.8–5.6)

## 2022-06-24 LAB — INTERPRETATION:

## 2022-06-24 LAB — FERRITIN: Ferritin: 54 ng/mL (ref 15–150)

## 2022-06-24 LAB — THYROID STIMULATING HORMONE (TSH) WITH REFLEX TO FREE T4: TSH: 1.39 u[IU]/mL (ref 0.450–4.500)

## 2022-06-28 ENCOUNTER — Telehealth (INDEPENDENT_AMBULATORY_CARE_PROVIDER_SITE_OTHER): Payer: Self-pay | Admitting: Family Medicine

## 2022-06-28 NOTE — Telephone Encounter (Signed)
-----   Message from Norberto Sorenson, DO sent at 06/28/2022  1:03 PM EDT -----  Patient does not have MyChart. Please inform patient that their labwork is normal other than she's now prediabetic.

## 2022-06-28 NOTE — Telephone Encounter (Signed)
Writer called patient and no answer.  Unable to leave message.Someone picked up the 202 number and says wrong number.RN will try again later.Ronnell Guadalajara

## 2022-06-30 ENCOUNTER — Ambulatory Visit
Admission: RE | Admit: 2022-06-30 | Discharge: 2022-06-30 | Disposition: A | Discharge status: Home / Self Care | Payer: Medicaid Other | Source: Ambulatory Visit | Attending: Student in an Organized Health Care Education/Training Program | Admitting: Student in an Organized Health Care Education/Training Program

## 2022-06-30 DIAGNOSIS — E049 Nontoxic goiter, unspecified: Secondary | ICD-10-CM | POA: Insufficient documentation

## 2022-07-01 ENCOUNTER — Encounter (INDEPENDENT_AMBULATORY_CARE_PROVIDER_SITE_OTHER): Payer: Self-pay

## 2022-07-01 NOTE — Progress Notes (Signed)
Pt has mychart at this time.Message sent to her.Ronnell Guadalajara

## 2022-07-08 ENCOUNTER — Ambulatory Visit: Payer: Medicaid Other

## 2022-07-08 ENCOUNTER — Encounter (INDEPENDENT_AMBULATORY_CARE_PROVIDER_SITE_OTHER): Payer: Medicaid Other | Admitting: Family Medicine

## 2022-07-15 ENCOUNTER — Ambulatory Visit: Payer: Medicaid Other

## 2022-08-03 ENCOUNTER — Emergency Department
Admission: EM | Admit: 2022-08-03 | Discharge: 2022-08-03 | Disposition: A | Discharge status: Home / Self Care | Payer: Medicaid Other | Attending: Student in an Organized Health Care Education/Training Program | Admitting: Student in an Organized Health Care Education/Training Program

## 2022-08-03 DIAGNOSIS — M791 Myalgia, unspecified site: Secondary | ICD-10-CM | POA: Insufficient documentation

## 2022-08-03 DIAGNOSIS — H109 Unspecified conjunctivitis: Secondary | ICD-10-CM | POA: Insufficient documentation

## 2022-08-03 DIAGNOSIS — Z87891 Personal history of nicotine dependence: Secondary | ICD-10-CM | POA: Insufficient documentation

## 2022-08-03 DIAGNOSIS — J029 Acute pharyngitis, unspecified: Secondary | ICD-10-CM | POA: Insufficient documentation

## 2022-08-03 LAB — COVID-19 (SARS-COV-2) & INFLUENZA  A/B, NAA (ROCHE LIAT)
Influenza A: NOT DETECTED
Influenza B: NOT DETECTED
SARS-CoV-2 Overall Result: NOT DETECTED

## 2022-08-03 MED ORDER — ERYTHROMYCIN 5 MG/GM OP OINT
TOPICAL_OINTMENT | Freq: Four times a day (QID) | OPHTHALMIC | 0 refills | Status: AC
Start: 2022-08-03 — End: 2022-08-08

## 2022-08-03 MED ORDER — FLUTICASONE PROPIONATE 50 MCG/ACT NA SUSP
2.0000 | Freq: Every day | NASAL | 0 refills | Status: DC
Start: 2022-08-03 — End: 2024-02-22

## 2022-08-03 NOTE — Progress Notes (Deleted)
Subjective:      Date: 08/04/2022 2:36 PM   Patient ID: Judy Aguirre is a 42 y.o. female.    Chief Complaint:  No chief complaint on file.      HPI: Patient is a 42 y/o female who presents for acute visit:    Sore throat, right eye swelling, body aches  Weight loss     HPI     {NMG Additional Concerns:35282}    {Vanishing Tip Click a link below to be taken to that activity or part of the chart   Chart Review  Order Review  Review Flowsheets  Labs  Health Maintenance  Immunizations  Allergies  Medications  Problem List  History :55325}  Problem List:  Patient Active Problem List   Diagnosis    High grade squamous intraepithelial cervical dysplasia    History of preterm delivery    HSV-2 seropositive    Elderly multigravida, third trimester    Cystic hygroma    Breast lump    Abnormal findings on diagnostic imaging of breast    Family history of malignant neoplasm of breast    Vaginal discharge during pregnancy in third trimester    Encounter for screening for malignant neoplasm of breast, unspecified screening modality    Unintentional weight loss    Enlarged thyroid       Current Medications:  No outpatient medications have been marked as taking for the 08/04/22 encounter (Appointment) with Tinnie Gens, Nelida Mandarino, DO.       Allergies:  Allergies   Allergen Reactions    Penicillins Other (See Comments) and Rash     Has patient had a PCN reaction causing immediate rash, facial/tongue/throat swelling, SOB or lightheadedness with hypotension: Yes  Has patient had a PCN reaction causing severe rash involving mucus membranes or skin necrosis: No  Has patient had a PCN reaction that required hospitalization: Yes  Has patient had a PCN reaction occurring within the last 10 years: Yes  If all of the above answers are "NO", then may proceed with Cephalosporin use.  Was told by parent she was allergic as child       Past Medical History:  Past Medical History:   Diagnosis Date    Anemia     iron supplement during  third trimester    Breast disorder 2019    benign lump left breast    Breast lump 10/27/2017    Breast lump 10/24/2017    Hyperlipidemia     Seasonal allergic rhinitis        Past Surgical History:  Past Surgical History:   Procedure Laterality Date    D & E, SUCTION N/A 09/10/2016    Procedure: D & E, SUCTION;  Surgeon: Erenest Blank, MD;  Location: [facility];  Service: Gynecology;  Laterality: N/A;    DILATION AND CURETTAGE OF UTERUS      INDUCED ABORTION      x2    LEEP LLETZ  05/21/2013    Procedure: LEEP LLETZ;  Surgeon: Jeanie Cooks, MD;  Location: [facility];  Service: Gynecology;  Laterality: N/A;    VAGINAL DELIVERY  2000       Family History:  Family History   Problem Relation Age of Onset    Breast cancer Mother 10    Cancer Mother     No known problems Father     Breast cancer Paternal Aunt 42        Paternal half-aunt    Cancer Paternal  Aunt     Breast cancer Cousin 51        Paternal half-cousin; Pt reports genetic testing was negative, report not reviewed    No known problems Son     Ovarian cancer Neg Hx        Social History:  Social History     Tobacco Use    Smoking status: Former     Types: Cigarettes    Smokeless tobacco: Former    Tobacco comments:     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 Denies Fatigue. Denies Fever.   Ophthalmologic:   Denies Blurred vision.   ENT:   Denies Nasal Discharge. Denies Sinus pain. Denies Sore throat.   Respiratory:   Denies Cough. Denies Shortness of breath. Denies Wheezing.   Cardiovascular:   Denies Chest pain. Denies Chest pain with exertion. Denies Palpitations. Denies  Swelling in hands/feet.   Gastrointestinal:   Denies Abdominal pain. Denies Constipation. Denies Diarrhea. Denies Nausea. Denies  Vomiting.   Skin:   Denies  Rash.   Neurologic:   Denies Dizziness. Denies Headache. Denies Tingling/Numbness.     Objective:   Vitals:  There were no vitals taken for this visit.      Physical Exam:  General Examination:   Physical Exam   GENERAL APPEARANCE: alert, in no acute distress, well developed, well nourished  oriented to time, place, and person.   ORAL CAVITY: normal oropharynx, normal lips, mucosa moist.   NECK/THYROID: neck supple, no carotid bruit, no neck mass palpated, no jugular venous distention, no thyromegaly.  LYMPH: no cervical/supraclavicular adenopathy   HEART: S1, S2 normal, no murmurs, rubs, gallops, regular rate and rhythm.   LUNGS: normal effort / no distress, normal breath sounds, clear to auscultation  bilaterally, no wheezes, rales, rhonchi.   EXTREMITIES: No LE edema bilaterally.   PERIPHERAL PULSES: 2+ dorsalis pedis, 2+ posterior tibial bilaterally.   PSYCH: alert and oriented to time,place and person.   SKIN: moist, no focal rash    Assessment:       There are no diagnoses linked to this encounter.      Plan:         Follow-up:   No follow-ups on file.       Angelisa Winthrop, DO

## 2022-08-03 NOTE — ED Notes (Signed)
Patient left the ED without proper discharge from attending provider and attending nurse.

## 2022-08-03 NOTE — ED Provider Notes (Signed)
[facility] EMERGENCY DEPARTMENT  ATTENDING PHYSICIAN ATTESTATION NOTE     Patient Name: Judy Aguirre, Judy Aguirre  Encounter Date:  08/03/2022  Attending Physician: Inetta Fermo, MD  Room:  N RW23/N TD32  Patient DOB:  09-29-1980  Age: 42 y.o. female  MRN:  [id number]  PCP: Sandrea Matte, DO         Diagnosis/Disposition:     Final Impression  Final diagnoses:   Conjunctivitis of right eye, unspecified conjunctivitis type     Disposition  ED Disposition       ED Disposition   Discharge    Condition   --    Date/Time   Tue Aug 03, 2022 12:13 PM    Comment   Cathyann Kilfoyle discharge to home/self care.    Condition at disposition: Stable               Follow up  No follow-up provider specified.  Prescriptions  Discharge Medication List as of 08/03/2022 12:29 PM        START taking these medications    Details   erythromycin [facility]) ophthalmic ointment Place into the right eye every 6 (six) hours for 5 days Apply small ribbon to lower lid 4 times a day for 5 days, Starting Tue 08/03/2022, Until Sun 08/08/2022, E-Rx      fluticasone (FLONASE) 50 MCG/ACT nasal spray 2 sprays by Nasal route daily for 7 days, Starting Tue 08/03/2022, Until Tue 08/10/2022, E-Rx                 MDM:      Nursing Triage note: Pt ambulatory with sore throat, right eye swelling/pain, right ear pain, diffuse body aches. 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 Reports 50lb weight loss over last 6months.    Initial Assessment and Differential Diagnosis:    History of Present Illness:   Judy Aguirre is a 42 y.o. female here with 4 days of right eye redness and crusty discharge.  She is also had some right ear pain.  She [place] the [place] with chemicals and she is worried that the exposure to the fumes of the chemicals may have caused this.  She wears glasses and does not think that something went directly into the eye.  She has not had any fever.  She does think she is lost some weight  over the last few months, has intermittently had some joint aches although not currently.      Pertinent Physical Exam Findings:   Vital signs grossly within normal limits.   Well appearing, nontoxic, breathing comfortably on RA, warm and well perfused. Moving all extremities spontaneously without gross deficits noted. R eye with conjunctival injection. Ph WNL at 7 on pH paper. IOP R eye 18-20 (WNL). PERRL, EOMI. Fluorescein stain without focal uptake. R TM with some bulging but no erythema. No pain with pinna manipulation or edema/erythema in mastoid region. Able to ambulate and move all extremities without difficulty.       ED Course as of 08/03/22 1605   Tue Aug 03, 2022   1113 Seen with LIP - 42 y/o F here with R eye pain, R ear pain x 4 days. Chills, body aches, sore throat. 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 Worse after work. No sick contacts. Otherwise well. Vision unchanged.     Ddx includes viral syndrome, covid, flu, conjunctivitis, OM, OE, strep, chemical exposure. Will do eye exam [  AZ]   1132 On exam patient with no conjunctival uptake or FB noted. Normal pressure an PH. Suspect conjunctivitis. Will cover dx with erythromycin ointment. Her TM does not show signs of infection but does appear to have some clear fluid behind - discussed with her will try nasal decongestant. Advised PCP and optho follow up for worsening sx. Also advised to see PCP for weight loss and intermittent joint pains.     Pending covid/flu test [AZ]   1212 Patient left without being discharged prior to discharge. Meds sent. [AZ]      ED Course User Index  [AZ] Bosie Clos Loleta Rose, MD       Medical Decision Making      The patient's past medical records, including those in Care Everywhere when necessary, were reviewed by me.           Diagnostic Results:     Laboratory Studies:    All lab values have been personally reviewed by me    Results       Procedure Component Value Units Date/Time    COVID-19  (SARS-CoV-2) and Influenza A/B, NAA (Liat Rapid) [[id number]] Collected: 08/03/22 1109    Specimen: Culturette from Nasopharyngeal Updated: 08/03/22 1203     Purpose of COVID testing Diagnostic -PUI     SARS-CoV-2 Specimen Source Nasal Swab     SARS CoV 2 Overall Result Not Detected     Influenza A Not Detected     Influenza B Not Detected    Narrative:      o Collect and clearly label specimen type:  o PREFERRED-Upper respiratory specimen: One Nasal Swab in  Transport Media.  o Hand deliver to laboratory ASAP  Diagnostic -PUI            Radiology Studies:    All images have been personally viewed by me    No orders to display           Interpretations, Clinical Decision Tools and Critical Care:        O2 Sat-           saturation: 100 %; Oxygen use: room air; Interpretation: Normal             Procedures:   Procedures        Orders Placed During This Visit:     Encounter Orders:  Orders Placed This Encounter   Procedures    COVID-19 (SARS-CoV-2) and Influenza A/B, NAA ([facility]iat Rapid)       Encounter Medications:  Medications - No data to display        Allergies & Medications:     Allergies:  Sheis allergic to penicillins.    Home Medications               ibuprofen (ADVIL) 600 MG tablet     Take 1 tablet (600 mg total) by mouth every 6 (six) hours     naproxen (NAPROSYN) 500 MG tablet     Take 1 tablet (500 mg) by mouth 2 (two) times daily with meals     Patient not taking: Reported on 06/23/2022     Prenatal MV-Min-Fe Fum-FA-DHA (PRENATAL 1 PO)     Take by mouth     Patient not taking: Reported on 06/23/2022                 Past History:     Medical:   Past Medical History:   Diagnosis Date    Anemia  iron supplement during third trimester    Breast disorder 2019    benign lump left breast    Breast lump 10/27/2017    Breast lump 10/24/2017    Hyperlipidemia     Seasonal allergic rhinitis        Surgical: She has a past surgical history that includes Vaginal delivery (2000); Induced abortion; LEEP LLETZ (05/21/2013); D  & E, SUCTION (N/A, 09/10/2016); and Dilation and curettage of uterus.    Family:   Family History   Problem Relation Age of Onset    Breast cancer Mother 67    Cancer Mother     No known problems Father     Breast cancer Paternal Aunt 42        Paternal half-aunt    Cancer Paternal Aunt     Breast cancer Cousin 62        Paternal half-cousin; Pt reports genetic testing was negative, report not reviewed    No known problems Son     Ovarian cancer Neg Hx        Social: She reports that she has quit smoking. Her smoking use included cigarettes. She has quit using smokeless tobacco. She reports that she does not drink alcohol and does not use drugs.        ATTESTATIONS     Inetta Fermo, MD    Scribe Attestation:    There was no scribe involved in the care of this patient.     Midlevel Provider Attestation:     The patient was seen and examined by the mid-level (resident, physician assistant or nurse practitioner) and the plan of care was discussed with me. I personally saw the patient and made/approved the management plan and take responsibility for the patient management.  Please see the separately documented midlevel note for additional information including but not limited to full history of present illness, review of systems and comprehensive physical exam.    Documentation Notes:    Parts of this note were generated by the Epic EMR system/ Dragon speech recognition and may contain inherent errors or omissions not intended by the user. Grammatical errors, random word insertions, deletions, pronoun errors and incomplete sentences are occasional consequences of this technology due to software limitations. Not all errors are caught or corrected.    My documentation is often completed after the patient is no longer under my clinical care. In some cases, the Epic EMR may pull updated results into the above documentation which may not reflect all results or information that was available to me at the time of my medical  decision making.     If there are questions or concerns about the content of this note or information contained within the body of this dictation they should be addressed directly with the author for clarification.                    Tiajuana Amass, MD  08/03/22 [phone]

## 2022-08-03 NOTE — Discharge Instructions (Addendum)
Dear Ms. Judy Aguirre:    Thank you for choosing the [facility] Emergency Department, the premier emergency department in the [place] area.  I hope your visit today was EXCELLENT. You will receive a survey via text message that will give you the opportunity to provide feedback to your team about your visit. Please do not hesitate to reach out with any questions!    IF YOU DO NOT CONTINUE TO IMPROVE OR YOUR CONDITION WORSENS, PLEASE CONTACT YOUR DOCTOR OR RETURN IMMEDIATELY TO THE EMERGENCY DEPARTMENT.    Sincerely,  Zeke, Loleta Rose, MD  Attending Emergency Physician  [facility] Emergency Department      OBTAINING A PRIMARY CARE APPOINTMENT    Primary care physicians (PCPs, also known as primary care doctors) are either internists or family medicine doctors. Both types of PCPs focus on health promotion, disease prevention, patient education and counseling, and treatment of acute and chronic medical conditions.    If you need a primary care doctor, please call the below number and ask who is receiving new patients.     [facility]  Telephone:  [phone]  [email or web address]    DOCTOR REFERRALS  Call [phone] (available 24 hours a day, 7 days a week) if you need any further referrals and we can help you find a primary care doctor or specialist.  Also, available online at:  [email or web address]    YOUR CONTACT INFORMATION  Before leaving please check with registration to make sure we have an up-to-date contact number.  You can call registration at [phone] to update your information.  For questions about your hospital bill, please call [phone].  For questions about your Emergency Dept Physician bill please call [phone].      FREE HEALTH SERVICES  If you need help with health or social services, please call 2-1-1 for a free referral to resources in your area.  2-1-1 is a free service connecting people with information on health insurance, free clinics,  pregnancy, mental health, dental care, food assistance, housing, and substance abuse counseling.  Also, available online at:  http://www.[place].org    ORTHOPEDIC INJURY   Please know that significant injuries can exist even when an initial x-ray is read as normal or negative.  This can occur because some fractures (broken bones) are not initially visible on x-rays.  For this reason, close outpatient follow-up with your primary care doctor or bone specialist (orthopedist) is required.    MEDICATIONS AND FOLLOWUP  Please be aware that some prescription medications can cause drowsiness.  Use caution when driving or operating machinery.    The examination and treatment you have received in our Emergency Department is provided on an emergency basis, and is not intended to be a substitute for your primary care physician.  It is important that your doctor checks you again and that you report any new or remaining problems at that time.      ASSISTANCE WITH INSURANCE    Affordable Care Act  [facility])  Call to start or finish an application, compare plans, enroll or ask a question.  [phone]  TTY: [phone]  Web:  Healthcare.gov    Help Enrolling in [facility]  [employer] [place]  [phone] (TOLL-FREE)  317-375-2847 (TTY)  Web:  Http://www.coverva.org    Local Help Enrolling in the [facility]  [place] [facility]  [phone] (MAIN)  Email:  health-help@nvfs .org  Web:  BlackjackMyths.is  Address:  [address], [address] [place], [place] [address]  SEDATING MEDICATIONS  Sedating medications include strong pain medications (e.g. narcotics), muscle relaxers, benzodiazepines (used for anxiety and as muscle relaxers), Benadryl/diphenhydramine and other antihistamines for allergic reactions/itching, and other medications.  If you are unsure if you have received a sedating medication, please ask your physician or nurse.  If you received a sedating medication: DO NOT drive a car. DO NOT operate  machinery. DO NOT perform jobs where you need to be alert.  DO NOT drink alcoholic beverages while taking this medicine.     If you get dizzy, sit or lie down at the first signs. Be careful going up and down stairs.  Be extra careful to prevent falls.     Never give this medicine to others.     Keep this medicine out of reach of children.     Do not take or save old medicines. Throw them away when outdated.     Keep all medicines in a cool, dry place. DO NOT keep them in your bathroom medicine cabinet or in a cabinet above the stove.    MEDICATION REFILLS  Please be aware that we cannot refill any prescriptions through the ER. If you need further treatment from what is provided at your ER visit, please follow up with your primary care doctor or your pain management specialist.    [place]  Did you know [facility] has two freestanding ERs located just a few miles away?  [facility] [place] have short wait times, easy free parking directly in front of the building and top patient satisfaction scores - and the same Board Certified Emergency Medicine doctors as [facility].

## 2022-08-03 NOTE — ED Notes (Signed)
[facility] EMERGENCY DEPARTMENT RESIDENT H&P       CLINICAL INFORMATION        HPI:      Chief Complaint: Eye Problem, Sore Throat, and Generalized Body Aches  .    Judy Aguirre is a 42 y.o. female who presents with fever, chills, right eye and ear pain for last 4 days.  Notes that this is the third episode of these symptoms which seems to be related to when she works a shift at her job. She works part-time at [employer]. She does note she is exposed to different sprays and chemicals at this job and often is breathing this in. Wears gloves, gown, surgical mask at work. These episodes have only occurred after finishing a shift. After a few days, symptoms subside on their own. She states she has taken tylenol and motrin with little relief. Also notes 50 lb weight loss over last 6 months for which she has seen PCM for and had workup with no significant findings. Otherwise takes no medications and has no chronic medical issues. No known sick contacts. Denies chest pain, dyspnea, cough, nausea, blurred vision.    History obtained from: patient        ROS:      See HPI      Physical Exam:      Pulse 90  BP 112/73  Resp 18  SpO2 100 %  Temp 98.5 F (36.9 C)    Physical Examination: General appearance - alert, well appearing, and in no distress  Eyes - pupils equal and reactive, extraocular eye movements intact, conjunctivitis noted right eye with erythema  Ears - R TM with mild bulging, otherwise no erythema/loss of landmarks, LTM and external ear canals normal, hearing grossly normal bilaterally; no pain or swelling at outer ear  Nose - normal and patent, no erythema, discharge or polyps  Mouth - mucous membranes moist, pharynx normal without lesions and tonsils normal  Neck - supple, no significant adenopathy  Skin - normal coloration and turgor, no rashes, no suspicious skin lesions noted              PAST HISTORY        Primary Care Provider: Sandrea Matte, DO         PMH/PSH:    .     Past Medical History:   Diagnosis Date    Anemia     iron supplement during third trimester    Breast disorder 2019    benign lump left breast    Breast lump 10/27/2017    Breast lump 10/24/2017    Hyperlipidemia     Seasonal allergic rhinitis        She has a past surgical history that includes Vaginal delivery (2000); Induced abortion; LEEP LLETZ (05/21/2013); D & E, SUCTION (N/A, 09/10/2016); and Dilation and curettage of uterus.      Social/Family History:      She reports that she has quit smoking. Her smoking use included cigarettes. She has quit using smokeless tobacco. She reports that she does not drink alcohol and does not use drugs.    Family History   Problem Relation Age of Onset    Breast cancer Mother 80    Cancer Mother     No known problems Father     Breast cancer Paternal Aunt 10        Paternal half-aunt    Cancer Paternal Aunt     Breast cancer Cousin  42        Paternal half-cousin; Pt reports genetic testing was negative, report not reviewed    No known problems Son     Ovarian cancer Neg Hx          Listed Medications on Arrival:    .     Home Medications               ibuprofen (ADVIL) 600 MG tablet     Take 1 tablet (600 mg total) by mouth every 6 (six) hours     naproxen (NAPROSYN) 500 MG tablet     Take 1 tablet (500 mg) by mouth 2 (two) times daily with meals     Patient not taking: Reported on 06/23/2022     Prenatal MV-Min-Fe Fum-FA-DHA (PRENATAL 1 PO)     Take by mouth     Patient not taking: Reported on 06/23/2022           Allergies: She is allergic to penicillins.            VISIT INFORMATION        Reassessments/Clinical Course:      Right eye with conjunctivitis, ears bilaterally not concerning for AOM. Otherwise exam unremarkable. Patient requesting something other than Tylenol/Motrin for pain.    Eye pH=7, eye pressure=20, negative fluorescein eye stain        Conversations with Other Providers:      None        Medications Given in the ED:    .     ED  Medication Orders (From admission, onward)      None              Procedures:      None      Assessment/Plan:      42 y/o otherwise healthy female who has recurrent episodes of body aches, chills, and left ear and eye pain. Physical exam notable for conjunctivitis of right eye, eye pressure, fluorescein staining, and pH wnl. Presentation concerning for URI vs. exposure to toxic substance at work given time correlation. Given exam is not consistent with AOM or other bacterial infection. Patient also concerned about intermittent body aches and weight loss and can follow up with [facility] outpatient for these concerns.    - erythromycin ointment for right eye  - flonase  - offered tylenol/motrin for pain which patient declined  - Flu/covid swab negative    Staff: Dr. Bosie Clos    Holley Dexter MD, MPH  PGY-2, Family Medicine  [facility]            Weston Settle, MD  Resident  08/03/22 1213

## 2022-08-04 ENCOUNTER — Ambulatory Visit (INDEPENDENT_AMBULATORY_CARE_PROVIDER_SITE_OTHER): Payer: Medicaid Other | Admitting: Family Medicine

## 2022-08-04 NOTE — Progress Notes (Unsigned)
Subjective:      Date: 08/05/2022 3:54 PM   Patient ID: Judy Aguirre is a 43 y.o. female.    Chief Complaint:  No chief complaint on file.      HPI: Patient is a 42 y/o female who presents for acute visit:      HPI     {NMG Additional Concerns:35282}    {Vanishing Tip Click a link below to be taken to that activity or part of the chart   Chart Review  Order Review  Review Flowsheets  Labs  Health Maintenance  Immunizations  Allergies  Medications  Problem List  History :55325}  Problem List:  Patient Active Problem List   Diagnosis    High grade squamous intraepithelial cervical dysplasia    History of preterm delivery    HSV-2 seropositive    Elderly multigravida, third trimester    Cystic hygroma    Breast lump    Abnormal findings on diagnostic imaging of breast    Family history of malignant neoplasm of breast    Vaginal discharge during pregnancy in third trimester    Encounter for screening for malignant neoplasm of breast, unspecified screening modality    Unintentional weight loss    Enlarged thyroid       Current Medications:  No outpatient medications have been marked as taking for the 08/05/22 encounter (Appointment) with Judy Aguirre.       Allergies:  Allergies   Allergen Reactions    Penicillins Other (See Comments) and Rash     Has patient had a PCN reaction causing immediate rash, facial/tongue/throat swelling, SOB or lightheadedness with hypotension: Yes  Has patient had a PCN reaction causing severe rash involving mucus membranes or skin necrosis: No  Has patient had a PCN reaction that required hospitalization: Yes  Has patient had a PCN reaction occurring within the last 10 years: Yes  If all of the above answers are "NO", then may proceed with Cephalosporin use.  Was told by parent she was allergic as child       Past Medical History:  Past Medical History:   Diagnosis Date    Anemia     iron supplement during third trimester    Breast disorder 2019    benign lump left  breast    Breast lump 10/27/2017    Breast lump 10/24/2017    Hyperlipidemia     Seasonal allergic rhinitis        Past Surgical History:  Past Surgical History:   Procedure Laterality Date    D & E, SUCTION N/A 09/10/2016    Procedure: D & E, SUCTION;  Surgeon: Judy Blank, MD;  Location: [facility];  Service: Gynecology;  Laterality: N/A;    DILATION AND CURETTAGE OF UTERUS      INDUCED ABORTION      x2    LEEP LLETZ  05/21/2013    Procedure: LEEP LLETZ;  Surgeon: Judy Cooks, MD;  Location: [facility];  Service: Gynecology;  Laterality: N/A;    VAGINAL DELIVERY  2000       Family History:  Family History   Problem Relation Age of Onset    Breast cancer Mother 55    Cancer Mother     No known problems Father     Breast cancer Paternal Aunt 62        Paternal half-aunt    Cancer Paternal Aunt     Breast cancer Cousin 24  Paternal half-cousin; Pt reports genetic testing was negative, report not reviewed    No known problems Son     Ovarian cancer Neg Hx        Social History:  Social History     Tobacco Use    Smoking status: Former     Types: Cigarettes    Smokeless tobacco: Former    Tobacco comments:     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 Denies Fatigue. Denies Fever.   Ophthalmologic:   Denies Blurred vision.   ENT:   Denies Nasal Discharge. Denies Sinus pain. Denies Sore throat.   Respiratory:   Denies Cough. Denies Shortness of breath. Denies Wheezing.   Cardiovascular:   Denies Chest pain. Denies Chest pain with exertion. Denies Palpitations. Denies  Swelling in hands/feet.   Gastrointestinal:   Denies Abdominal pain. Denies Constipation. Denies Diarrhea. Denies Nausea. Denies  Vomiting.   Skin:   Denies Rash.   Neurologic:   Denies Dizziness. Denies Headache.  Denies Tingling/Numbness.     Objective:   Vitals:  There were no vitals taken for this visit.      Physical Exam:  General Examination:   Physical Exam   GENERAL APPEARANCE: alert, in no acute distress, well developed, well nourished  oriented to time, place, and person.   ORAL CAVITY: normal oropharynx, normal lips, mucosa moist.   NECK/THYROID: neck supple, no carotid bruit, no neck mass palpated, no jugular venous distention, no thyromegaly.  LYMPH: no cervical/supraclavicular adenopathy   HEART: S1, S2 normal, no murmurs, rubs, gallops, regular rate and rhythm.   LUNGS: normal effort / no distress, normal breath sounds, clear to auscultation  bilaterally, no wheezes, rales, rhonchi.   EXTREMITIES: No LE edema bilaterally.   PERIPHERAL PULSES: 2+ dorsalis pedis, 2+ posterior tibial bilaterally.   PSYCH: alert and oriented to time,place and person.   SKIN: moist, no focal rash    Assessment:       There are no diagnoses linked to this encounter.      Plan:         Follow-up:   No follow-ups on file.       Judy Bergevin, Aguirre

## 2022-08-05 ENCOUNTER — Encounter (INDEPENDENT_AMBULATORY_CARE_PROVIDER_SITE_OTHER): Payer: Self-pay | Admitting: Family Medicine

## 2022-08-05 ENCOUNTER — Ambulatory Visit (INDEPENDENT_AMBULATORY_CARE_PROVIDER_SITE_OTHER): Payer: Medicaid Other | Admitting: Family Medicine

## 2022-08-05 VITALS — BP 110/71 | HR 81 | Temp 99.0°F | Wt 151.0 lb

## 2022-08-05 DIAGNOSIS — R5383 Other fatigue: Secondary | ICD-10-CM

## 2022-08-05 DIAGNOSIS — E041 Nontoxic single thyroid nodule: Secondary | ICD-10-CM

## 2022-08-05 DIAGNOSIS — J302 Other seasonal allergic rhinitis: Secondary | ICD-10-CM

## 2022-08-05 DIAGNOSIS — R634 Abnormal weight loss: Secondary | ICD-10-CM

## 2022-08-05 DIAGNOSIS — J4540 Moderate persistent asthma, uncomplicated: Secondary | ICD-10-CM

## 2022-08-05 DIAGNOSIS — J069 Acute upper respiratory infection, unspecified: Secondary | ICD-10-CM

## 2022-08-05 MED ORDER — MONTELUKAST SODIUM 10 MG PO TABS
10.0000 mg | ORAL_TABLET | Freq: Every evening | ORAL | 0 refills | Status: DC
Start: 2022-08-05 — End: 2022-08-29

## 2022-08-05 MED ORDER — FLUTICASONE PROPIONATE HFA 110 MCG/ACT IN AERO
1.0000 | INHALATION_SPRAY | Freq: Two times a day (BID) | RESPIRATORY_TRACT | 0 refills | Status: DC
Start: 2022-08-05 — End: 2022-08-27

## 2022-08-05 MED ORDER — ALBUTEROL SULFATE HFA 108 (90 BASE) MCG/ACT IN AERS
2.0000 | INHALATION_SPRAY | RESPIRATORY_TRACT | 0 refills | Status: DC | PRN
Start: 2022-08-05 — End: 2023-06-03

## 2022-08-05 NOTE — Progress Notes (Signed)
Have you seen any specialists since your last visit with [place]?  Yes, ED      The patient was informed that the following HM items are still outstanding:   pap smear and depression screening

## 2022-08-10 ENCOUNTER — Ambulatory Visit: Payer: Medicaid Other | Attending: Student in an Organized Health Care Education/Training Program

## 2022-08-10 DIAGNOSIS — Z1231 Encounter for screening mammogram for malignant neoplasm of breast: Secondary | ICD-10-CM | POA: Insufficient documentation

## 2022-08-10 DIAGNOSIS — Z1239 Encounter for other screening for malignant neoplasm of breast: Secondary | ICD-10-CM | POA: Insufficient documentation

## 2022-08-10 DIAGNOSIS — R634 Abnormal weight loss: Secondary | ICD-10-CM | POA: Insufficient documentation

## 2022-08-11 ENCOUNTER — Other Ambulatory Visit: Payer: Medicaid Other

## 2022-08-16 ENCOUNTER — Ambulatory Visit
Admission: RE | Admit: 2022-08-16 | Discharge: 2022-08-16 | Disposition: A | Discharge status: Home / Self Care | Payer: Medicaid Other | Source: Ambulatory Visit | Attending: Family Medicine

## 2022-08-16 DIAGNOSIS — J069 Acute upper respiratory infection, unspecified: Secondary | ICD-10-CM | POA: Insufficient documentation

## 2022-08-16 DIAGNOSIS — R5383 Other fatigue: Secondary | ICD-10-CM

## 2022-08-16 DIAGNOSIS — R634 Abnormal weight loss: Secondary | ICD-10-CM

## 2022-08-16 DIAGNOSIS — J4540 Moderate persistent asthma, uncomplicated: Secondary | ICD-10-CM | POA: Insufficient documentation

## 2022-08-17 ENCOUNTER — Ambulatory Visit (INDEPENDENT_AMBULATORY_CARE_PROVIDER_SITE_OTHER): Payer: Medicaid Other | Admitting: Internal Medicine

## 2022-08-17 ENCOUNTER — Encounter (INDEPENDENT_AMBULATORY_CARE_PROVIDER_SITE_OTHER): Payer: Self-pay | Admitting: Internal Medicine

## 2022-08-17 DIAGNOSIS — D1721 Benign lipomatous neoplasm of skin and subcutaneous tissue of right arm: Secondary | ICD-10-CM

## 2022-08-17 NOTE — Progress Notes (Signed)
Chief Complaint   Patient presents with    Mass     Lump on right shoulder,bothersome when lifting arms, would like to have it checked out       HPI    Fancy Dunkley is a 42 y.o. female here for evaluation and treatment of the following concerns  Pt noted a lump on rt shoulder.    The patient's past medical history, surgical history, family history, medications and allergies were reviewed.    Allergies   Allergen Reactions    Penicillins Other (See Comments) and Rash     Has patient had a PCN reaction causing immediate rash, facial/tongue/throat swelling, SOB or lightheadedness with hypotension: Yes  Has patient had a PCN reaction causing severe rash involving mucus membranes or skin necrosis: No  Has patient had a PCN reaction that required hospitalization: Yes  Has patient had a PCN reaction occurring within the last 10 years: Yes  If all of the above answers are "NO", then may proceed with Cephalosporin use.  Was told by parent she was allergic as child       Medications  Current Outpatient Medications   Medication Sig Dispense Refill    albuterol sulfate HFA (PROVENTIL) 108 (90 Base) MCG/ACT inhaler Inhale 2 puffs into the lungs every 4 (four) hours as needed for Wheezing 1 each 0    fluticasone (FLOVENT HFA) 110 MCG/ACT inhaler Inhale 1 puff into the lungs 2 (two) times daily 1 each 0    montelukast (SINGULAIR) 10 MG tablet Take 1 tablet (10 mg) by mouth nightly 30 tablet 0    fluticasone (FLONASE) 50 MCG/ACT nasal spray 2 sprays by Nasal route daily for 7 days 9.9 mL 0     No current facility-administered medications for this visit.     Wt Readings from Last 3 Encounters:   08/05/22 68.5 kg (151 lb)   08/03/22 69.9 kg (154 lb)   06/23/22 69.9 kg (154 lb)     Review of Systems  CONST: No weight change, no fevers/chills/sweats, fatigue, muscle aches, appetite loss  EARS: No hearing loss, tinnitus, pain or discharge.   NOSE: No congestion, runny nose or bloody nose  NECK: No swollen glands, stiffness  or pain  CV: No CP, palpitations, leg swelling, changes in exercise tolerance, PND, orthopnea  RESP: No SOB, cough, wheeze, asthma  GI: No n/v, diarrhea, constipation, abd pain, heartburn, blood in stool, early satiety  MSK:  No joint or muscle pain, swelling or weakness  SKIN:   No rashes lumps, sores, concerning moles, or changes in hair or nails.  HEME:  No bruising/bleeding, swollen glands  NEURO:  No HA, LH, dizziness, LOC, numbness/tingling, weakness  PSYCH:  No depressed mood, anhedonia, anxiety    Physical Exam:  There were no vitals taken for this visit.  Wt Readings from Last 3 Encounters:   08/05/22 68.5 kg (151 lb)   08/03/22 69.9 kg (154 lb)   06/23/22 69.9 kg (154 lb)     GEN: WD WNalert and appropriate in NAD  HENT: NCAT, TMs normal bilaterally, no nasal congestion, OP normal w no exudate or erythema  EYES: PERRL, EOMI, no pallor or scleral icterus, normal conjunctiva  LYMPH: no cervical or supraclavicular LAD appreciated  CV: RRR, no m/r/g.  No LE edema.  2+ radial pulses present and equal.  RESP: CTAB, normal effort, no rhonchi or rales  GI: soft, nontender/ nondistended, NABS, no rebound or guarding  SKIN: warm, dry.  no visible rashes  or concerning moles 3 cm soft subcutaneous nodule.   MSK: Normal bulk and tone, normal strength 5/5 and sensation UE / LE  NEURO: No CN deficits noted (II-XII) PERRLA, EOMI, face symmetric, hearing intact to voice, palate raise, shoulder shrug and neck flexion intact, tongue midline. MAE.  PSYCH: normal mood and affect    Assessment:  1. Lipoma of right upper extremity  - Referral to General Surgery ([place]); Future        Plan:  Refer to surgery at pt request. The lesion is compatible with a lipoma and is not an acute threat.      Carlota Raspberry, M.D.

## 2022-08-17 NOTE — Progress Notes (Signed)
Have you seen any specialists since your last visit with us?  Yes      The patient was informed that the following HM items are still outstanding:   nothing at this time, HM is up-to-date.

## 2022-08-18 ENCOUNTER — Encounter (INDEPENDENT_AMBULATORY_CARE_PROVIDER_SITE_OTHER): Payer: Self-pay

## 2022-08-27 ENCOUNTER — Other Ambulatory Visit (INDEPENDENT_AMBULATORY_CARE_PROVIDER_SITE_OTHER): Payer: Self-pay | Admitting: Family Medicine

## 2022-08-27 DIAGNOSIS — R5383 Other fatigue: Secondary | ICD-10-CM

## 2022-08-27 DIAGNOSIS — J4540 Moderate persistent asthma, uncomplicated: Secondary | ICD-10-CM

## 2022-08-27 DIAGNOSIS — J069 Acute upper respiratory infection, unspecified: Secondary | ICD-10-CM

## 2022-08-27 DIAGNOSIS — R634 Abnormal weight loss: Secondary | ICD-10-CM

## 2022-08-27 MED ORDER — FLUTICASONE PROPIONATE HFA 110 MCG/ACT IN AERO
1.0000 | INHALATION_SPRAY | Freq: Two times a day (BID) | RESPIRATORY_TRACT | 0 refills | Status: AC
Start: 2022-08-27 — End: ?

## 2022-08-27 NOTE — Telephone Encounter (Signed)
RN sent refill to the Pharmacy. Pepper Wyndham,RN

## 2022-09-01 ENCOUNTER — Ambulatory Visit (INDEPENDENT_AMBULATORY_CARE_PROVIDER_SITE_OTHER): Payer: Medicaid Other | Admitting: Surgery

## 2022-09-01 ENCOUNTER — Encounter (INDEPENDENT_AMBULATORY_CARE_PROVIDER_SITE_OTHER): Payer: Self-pay | Admitting: Surgery

## 2022-09-01 VITALS — BP 97/65 | HR 80 | Temp 98.0°F | Ht 67.0 in | Wt 151.4 lb

## 2022-09-01 DIAGNOSIS — D1721 Benign lipomatous neoplasm of skin and subcutaneous tissue of right arm: Secondary | ICD-10-CM

## 2022-09-01 DIAGNOSIS — M25811 Other specified joint disorders, right shoulder: Secondary | ICD-10-CM

## 2022-09-01 NOTE — Progress Notes (Signed)
Visit Date Time: 09/01/2022  1:41 PM     Referring Provider: Cassell Clement, MD Sandrea Matte, DO  [facility] Provider: Campbell Lerner, MD    Service: General Surgery    Reason For Visit:  Soft tissue mass      History of Present Illness:   Judy Aguirre is a 42 y.o. female who  has a past medical history of Anemia, Breast disorder (2019), Breast lump (10/27/2017), Breast lump (10/24/2017), Hyperlipidemia, and Seasonal allergic rhinitis..  She presents with a soft tissue mass.  Location -right anterior shoulder.  The mass has been present for 2 weeks.  There has been no enlargement.  Associated symptoms include: tenderness.  Pt denies bleeding and infection.  Prior removal has not been performed.  No recent imaging specific to this new condition has been obtained.  She has 2 young children-  ages 4 and 2.  She reports that she was pushing a stroller with both kids up a hill and then felt a sudden pain in her right shoulder and a subsequent swelling.     The following data was personally reviewed during the evaluation of this problem:  previous medical records/operative notes, imaging reports, and images or tracings -CT scan of the chest from 2/08/19/2022    Past Medical History:     Past Medical History:   Diagnosis Date    Anemia     iron supplement during third trimester    Breast disorder 2019    benign lump left breast    Breast lump 10/27/2017    Breast lump 10/24/2017    Hyperlipidemia     Seasonal allergic rhinitis        Past Surgical History:     Past Surgical History:   Procedure Laterality Date    D & E, SUCTION N/A 09/10/2016    Procedure: D & E, SUCTION;  Surgeon: Erenest Blank, MD;  Location: [facility];  Service: Gynecology;  Laterality: N/A;    DILATION AND CURETTAGE OF UTERUS      INDUCED ABORTION      x2    LEEP LLETZ  05/21/2013    Procedure: LEEP LLETZ;  Surgeon: Jeanie Cooks, MD;  Location: [facility];  Service: Gynecology;  Laterality: N/A;    VAGINAL DELIVERY  2000        Family History:     Family History   Problem Relation Age of Onset    Breast cancer Mother 94    Cancer Mother     No known problems Father     Breast cancer Paternal Aunt 62        Paternal half-aunt    Cancer Paternal Aunt     Breast cancer Cousin 33        Paternal half-cousin; Pt reports genetic testing was negative, report not reviewed    No known problems Son     Ovarian cancer Neg Hx        Social History:     Social History     Socioeconomic History    Marital status: Single   Tobacco Use    Smoking status: Former     Types: Cigarettes    Smokeless tobacco: Former    Tobacco comments:     Technical brewer    Vaping status: Never Used   Substance and Sexual Activity    Alcohol use: Never    Drug use: No    Sexual activity: Not Currently  Partners: Male     Social Determinants of Health     Food Insecurity: No Food Insecurity (08/03/2022)    Hunger Vital Sign     Worried About Running Out of Food in the Last Year: Never true     Ran Out of Food in the Last Year: Never true   Intimate Partner Violence: Not At Risk (08/03/2022)    Humiliation, Afraid, Rape, and Kick questionnaire     Fear of Current or Ex-Partner: No     Emotionally Abused: No     Physically Abused: No     Sexually Abused: No       Allergies:   Penicillins    Medications:     Current Outpatient Medications   Medication Sig Dispense Refill    albuterol sulfate HFA (PROVENTIL) 108 (90 Base) MCG/ACT inhaler Inhale 2 puffs into the lungs every 4 (four) hours as needed for Wheezing 1 each 0    fluticasone (FLOVENT HFA) 110 MCG/ACT inhaler Inhale 1 puff into the lungs 2 (two) times daily 1 each 0    montelukast (SINGULAIR) 10 MG tablet TAKE 1 TABLET BY MOUTH EVERY DAY NIGHTLY 90 tablet 1    fluticasone (FLONASE) 50 MCG/ACT nasal spray 2 sprays by Nasal route daily for 7 days 9.9 mL 0     No current facility-administered medications for this visit.       Review of Systems:   Review of Systems   All other systems reviewed and are negative.      As per the HPI and above. The patient otherwise denies any additional changes to their otic, opthalmologic, dermatologic, pulmonary, cardiac, gastrointestinal, genitourinary, musculoskeletal, hematologic, constitutional, or psychiatric systems.    Physical Exam:     Vitals:    09/01/22 1321   BP: 97/65   Pulse: 80   Temp: 98 F (36.7 C)   SpO2: 97%     Constitutional: Appears well, stated age.  Well nourished and well developed.  Eyes:  Conjunctiva and lids normal with no jaundice, PERRL.  Vision grossly intact.  Ear, Nose, Mouth and Throat:  Normal appearing external ears, nose and mouth.  Hearing grossly normal.  Normal lips, teeth and gums.  Neck: Symmetric with good range of motion.  Midline trachea.  Respiratory:  Normal respiration with no effort.  No audible wheezing.  CV:  No peripheral edema.  Abdomen: nonobese abdomen  Skin and soft tissue: Normal color and turgor.  Right anterior shoulder with soft deformable 2 x 3 cm ovoid swelling in the subcutaneous tissue  Neuro: No gross deficits in cranial nerve function, sensation or motor function.  Psych: Normal judgement, insight, memory, mood, affect.  Oriented X 3.      Labs:   No results found for: "CBC", "BMP"     Rads:     Radiology Results (24 Hour)       ** No results found for the last 24 hours. **          CT Chest WO/Contrast    Result Date: 08/19/2022  HISTORY:  Worsening shortness of breath and unintentional weight loss. COMPARISON: CT angiogram chest 07/03/2020. TECHNIQUE: CT of the chest performed without intravenous contrast. The following dose reduction techniques were utilized: automated exposure control and/or adjustment of the mA and/or KV according to patient size, and the use of an iterative reconstruction technique. CONTRAST: None. FINDINGS: Vascular, hilar and mediastinal detail is limited without IV contrast.  Stable 3 mm right middle lobe pulmonary nodule (series  303 image 71) since 2022. No suspicious nodule, infiltrate, interlobular  septal thickening, bronchiectasis or bronchial wall thickening. Vascularity normal. No pneumothorax. Central airways are patent. Stable prominent thyroid. No thoracic adenopathy. Heart size normal. No coronary artery calcification. Aorta normal in caliber. No pericardial or pleural fluid. No esophageal dilatation. Upper abdominal images demonstrate a right upper pole lesion measuring 9 mm containing macroscopic fat consistent with angiomyolipoma. Adrenal glands normal. No suspicious bone lesion. Mild pectus excavatum.     1. Normal chest except for stable 3 mm pulmonary nodule since 2022. No clear source of the patient's unintentional weight loss and shortness of breath. 2. Right renal angiomyolipoma. Mills Koller, MD 08/19/2022 12:14 PM    Mammo Screening 3D/Tomo Bilateral    Result Date: 08/12/2022  HISTORY: Screening mammogram. COMPARISON: Mammography 06/12/2018, biopsy 11/14/2017, mammography 10/27/2017 PARENCHYMAL PATTERN: The breasts are heterogeneously dense, which may obscure small masses. FINDINGS: No suspicious masses, suspicious calcifications or areas of architectural distortion.     No mammographic evidence of malignancy.     A LETTER WAS GENERATED TO THE PATIENT INDICATING THESE RESULTS.                                                             RECOMMENDATION:  SOCIETY OF BREAST IMAGING AND AMERICAN COLLEGE OF RADIOLOGY RECOMMENDATIONS FOR IMAGING SCREENING FOR BREAST CANCER  " Yearly screening mammogram from age 71* * for women at average risk of breast cancer. To see the full SBI/ACR recommendations go to http://[email or web address].org/Facts/Guidelines BIRADS CATEGORY 01-NEGATIVE MAMMOGRAM (1A) Bonita Quin, MD 08/12/2022 3:14 PM     Impression:     Patient Active Problem List   Diagnosis    High grade squamous intraepithelial cervical dysplasia    History of preterm delivery    HSV-2 seropositive    Elderly multigravida, third trimester    Cystic hygroma    Breast lump    Abnormal  findings on diagnostic imaging of breast    Family history of malignant neoplasm of breast    Vaginal discharge during pregnancy in third trimester    Encounter for screening for malignant neoplasm of breast, unspecified screening modality    Unintentional weight loss    Enlarged thyroid    Mass of joint of right shoulder     1. Lipoma of right upper extremity  - Referral to General Surgery ([place])    2. Mass of joint of right shoulder  - Ultrasound shoulder right MSK; Future     Plan:     Obtain ultrasound of soft tissues of right anterior shoulder to determine if this is a synovial/bursal cyst.  If so then surgical excision not indicated and will refer to Ortho .  If the palpable mass is in fact a fatty growth been we can proceed with office surgery.    Thank you for sending this patient to [facility] for evaluation.  We appreciate the opportunity to participate in her care.  We will address the issue for which she was sent, and afterward return the patient to your care.  If you have any questions, do not hesitate to contact us at [phone].    Signed by: Campbell Lerner, MD

## 2022-09-05 NOTE — Progress Notes (Unsigned)
Subjective:      Date: 09/06/2022 1:52 PM   Patient ID: Judy Aguirre is a 42 y.o. female.    Chief Complaint:  Chief Complaint   Patient presents with    foot pain     Right foot pain.        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  Has had more pain recently  Has needed lidocaine patches in the past which have been helpful   Went to podiatry recently - xray was completed ([facility])   States that this was few months ago         Problem List:  Patient Active Problem List   Diagnosis    High grade squamous intraepithelial cervical dysplasia    History of preterm delivery    HSV-2 seropositive    Elderly multigravida, third trimester    Cystic hygroma    Breast lump    Abnormal findings on diagnostic imaging of breast    Family history of malignant neoplasm of breast    Vaginal discharge during pregnancy in third trimester    Encounter for screening for malignant neoplasm of breast, unspecified screening modality    Unintentional weight loss    Enlarged thyroid    Mass of joint of right shoulder       Current Medications:  Outpatient Medications Marked as Taking for the 09/06/22 encounter (Office Visit) with Sandrea Matte, DO   Medication Sig Dispense Refill    albuterol sulfate HFA (PROVENTIL) 108 (90 Base) MCG/ACT inhaler Inhale 2 puffs into the lungs every 4 (four) hours as needed for Wheezing 1 each 0    fluticasone (FLOVENT HFA) 110 MCG/ACT inhaler Inhale 1 puff into the lungs 2 (two) times daily 1 each 0    montelukast (SINGULAIR) 10 MG tablet TAKE 1 TABLET BY MOUTH EVERY DAY NIGHTLY 90 tablet 1       Allergies:  Allergies   Allergen Reactions    Penicillins Other (See Comments) and Rash     Has patient had a PCN reaction causing immediate rash, facial/tongue/throat swelling, SOB or lightheadedness with  hypotension: Yes  Has patient had a PCN reaction causing severe rash involving mucus membranes or skin necrosis: No  Has patient had a PCN reaction that required hospitalization: Yes  Has patient had a PCN reaction occurring within the last 10 years: Yes  If all of the above answers are "NO", then may proceed with Cephalosporin use.  Was told by parent she was allergic as child       Past Medical History:  Past Medical History:   Diagnosis Date    Anemia     iron supplement during third trimester    Breast disorder 2019    benign lump left breast    Breast lump 10/27/2017    Breast lump 10/24/2017    Hyperlipidemia     Seasonal allergic rhinitis        Past Surgical History:  Past Surgical History:   Procedure Laterality Date    D & E, SUCTION N/A 09/10/2016    Procedure: D & E, SUCTION;  Surgeon: Erenest Blank, MD;  Location: [facility];  Service: Gynecology;  Laterality: N/A;    DILATION AND CURETTAGE OF UTERUS  INDUCED ABORTION      x2    LEEP LLETZ  05/21/2013    Procedure: LEEP LLETZ;  Surgeon: Jeanie Cooks, MD;  Location: [facility];  Service: Gynecology;  Laterality: N/A;    VAGINAL DELIVERY  2000       Family History:  Family History   Problem Relation Age of Onset    Breast cancer Mother 33    Cancer Mother     No known problems Father     Breast cancer Paternal Aunt 15        Paternal half-aunt    Cancer Paternal Aunt     Breast cancer Cousin 59        Paternal half-cousin; Pt reports genetic testing was negative, report not reviewed    No known problems Son     Ovarian cancer Neg Hx        Social History:  Social History     Tobacco Use    Smoking status: Former     Types: Cigarettes    Smokeless tobacco: Former    Tobacco comments:     Occupational hygienist status: Never Used   Substance Use Topics    Alcohol use: Never    Drug use: No          The following sections were reviewed this encounter by the provider:   Tobacco  Allergies  Meds  Problems  Med Hx  Surg Hx  Fam Hx            ROS:    General/Constitutional:   Denies Chills. Denies Fatigue. Denies Fever.   Ophthalmologic:   Denies Blurred vision.   ENT:   Denies Nasal Discharge. Denies Sinus pain. Denies Sore throat.   Respiratory:   Denies Cough. Denies Shortness of breath. Denies Wheezing.   Cardiovascular:   Denies Chest pain. Denies Chest pain with exertion. Denies Palpitations. Denies  Swelling in hands/feet.   Gastrointestinal:   Denies Abdominal pain. Denies Constipation. Denies Diarrhea. Denies Nausea. Denies  Vomiting.   Skin:   Denies Rash.   Neurologic:   Denies Dizziness. Denies Headache. Denies Tingling/Numbness.     Objective:   Vitals:  BP 103/67 (BP Site: Left arm, Patient Position: Sitting, Cuff Size: Medium)   Pulse 75   Temp 98.3 F (36.8 C) (Temporal)   Wt 68.5 kg (151 lb)   SpO2 99%   BMI 23.65 kg/m       Physical Exam:  General Examination:     GENERAL APPEARANCE: alert, in no acute distress, well developed, well nourished  oriented to time, place, and person.   PSYCH: alert and oriented to time,place and person.   SKIN: moist, no focal rash    Assessment:       1. Right foot pain  - lidocaine (LIDODERM) 5 %; Place 1 patch onto the skin every 24 hours Remove & Discard patch within 12 hours or as directed by MD  Dispense: 10 patch; Refill: 0    2. History of foot surgery  - lidocaine (LIDODERM) 5 %; Place 1 patch onto the skin every 24 hours Remove & Discard patch within 12 hours or as directed by MD  Dispense: 10 patch; Refill: 0        Plan:   Will send lidocaine patches   Needs to follow up with podiatry       Follow-up:   No follow-ups on file.  Amilliana Hayworth, DO

## 2022-09-06 ENCOUNTER — Encounter (INDEPENDENT_AMBULATORY_CARE_PROVIDER_SITE_OTHER): Payer: Self-pay | Admitting: Family Medicine

## 2022-09-06 ENCOUNTER — Ambulatory Visit (INDEPENDENT_AMBULATORY_CARE_PROVIDER_SITE_OTHER): Payer: Medicaid Other | Admitting: Family Medicine

## 2022-09-06 VITALS — BP 103/67 | HR 75 | Temp 98.3°F | Wt 151.0 lb

## 2022-09-06 DIAGNOSIS — M79671 Pain in right foot: Secondary | ICD-10-CM

## 2022-09-06 DIAGNOSIS — Z9889 Other specified postprocedural states: Secondary | ICD-10-CM

## 2022-09-06 MED ORDER — LIDOCAINE 5 % EX PTCH
1.0000 | MEDICATED_PATCH | CUTANEOUS | 0 refills | Status: DC
Start: 2022-09-06 — End: 2022-10-08

## 2022-09-06 NOTE — Progress Notes (Signed)
Have you seen any specialists since your last visit with us?  No      The patient was informed that the following HM items are still outstanding:   pap smear and depression screening

## 2022-10-08 ENCOUNTER — Other Ambulatory Visit (INDEPENDENT_AMBULATORY_CARE_PROVIDER_SITE_OTHER): Payer: Self-pay | Admitting: Family Medicine

## 2022-10-08 DIAGNOSIS — M79671 Pain in right foot: Secondary | ICD-10-CM

## 2022-10-08 DIAGNOSIS — Z9889 Other specified postprocedural states: Secondary | ICD-10-CM

## 2022-11-05 ENCOUNTER — Other Ambulatory Visit (INDEPENDENT_AMBULATORY_CARE_PROVIDER_SITE_OTHER): Payer: Self-pay | Admitting: Surgery

## 2022-11-05 DIAGNOSIS — M25811 Other specified joint disorders, right shoulder: Secondary | ICD-10-CM

## 2022-11-16 ENCOUNTER — Ambulatory Visit: Payer: Self-pay

## 2022-11-16 ENCOUNTER — Ambulatory Visit
Admission: RE | Admit: 2022-11-16 | Discharge: 2022-11-16 | Disposition: A | Discharge status: Home / Self Care | Payer: Medicaid Other | Source: Ambulatory Visit | Attending: Surgery | Admitting: Surgery

## 2022-11-16 DIAGNOSIS — M25811 Other specified joint disorders, right shoulder: Secondary | ICD-10-CM | POA: Insufficient documentation

## 2022-11-16 NOTE — Progress Notes (Signed)
Right lateral shoulder [place] indicates 2.8 x 2.8 x 0.7 cm encapsulated lipoma present. Per Dr. Darnelle Bos office note: if lipoma can proceed with office surgery. Request sent to admin team for scheduling. Unsure if order needed, will obtain if indicated.

## 2022-11-26 ENCOUNTER — Ambulatory Visit: Payer: Medicaid Other | Attending: Surgery | Admitting: Surgery

## 2022-11-26 ENCOUNTER — Encounter (INDEPENDENT_AMBULATORY_CARE_PROVIDER_SITE_OTHER): Payer: Self-pay | Admitting: Surgery

## 2022-11-26 ENCOUNTER — Ambulatory Visit (INDEPENDENT_AMBULATORY_CARE_PROVIDER_SITE_OTHER): Payer: Medicaid Other | Admitting: Surgery

## 2022-11-26 VITALS — BP 112/75 | HR 76 | Temp 98.1°F | Ht 67.0 in | Wt 148.1 lb

## 2022-11-26 DIAGNOSIS — M25811 Other specified joint disorders, right shoulder: Secondary | ICD-10-CM

## 2022-11-26 DIAGNOSIS — D1721 Benign lipomatous neoplasm of skin and subcutaneous tissue of right arm: Secondary | ICD-10-CM

## 2022-11-26 NOTE — Procedures (Signed)
Visit Date Time: 11/26/2022  10:33 AM     [facility] Provider: Campbell Lerner, MD    OFFICE SURGERY NOTE: Excision right shoulder subcutaneous mass of noncutaneous origin 4 cm     Prior to initiating the procedure, I verified the patient's identity by name and date of birth. I reviewed medications and allergies. The procedure was explained to the patient, including the benefits and risks, and the patient was allowed time to ask questions or express concerns. Written consent was obtained.  Any relevant testing or applicable imaging was reviewed. If visible, the site of the procedure was agreed upon by me and the patient, and marked as needed.        Reason for procedure: Right shoulder mass   size of lesion: 4 cm         Procedure note:  Time out was performed with confirmation of patient identity, procedure, and site.  The right shoulder was prepped with chlorhexidine solution and draped sterile fashion 7 cc of 1% lidocaine with epinephrine and bicarbonate was injected at the surgical site.  A transverse 4 cm incision was made overlying the palpable subcutaneous mass with a #15 scalpel.  Plane of dissection was brought down into the subcutaneous tissue with Metzenbaum scissors.  The mass was identified sitting on top of the deltoid fascia.  The mass was circumferentially excised in its entirety with the Metzenbaum scissors.  Hemostasis was achieved with direct pressure.  Intermediate layered closure was performed with 3-0 Vicryl sutures and running subcuticular 4-0 Monocryl suture.  Dermabond alternative was applied.  The patient tolerated the procedure well.  The site  was cleaned and a dressing region was applied.  Written wound care instructions were provided.    Number of specimens to pathology: 1    Cultures not obtained    Wound care instructions: no further care    Follow up: as needed      Campbell Lerner, MD

## 2022-11-27 ENCOUNTER — Other Ambulatory Visit (INDEPENDENT_AMBULATORY_CARE_PROVIDER_SITE_OTHER): Payer: Self-pay | Admitting: Family Medicine

## 2022-11-27 DIAGNOSIS — Z9889 Other specified postprocedural states: Secondary | ICD-10-CM

## 2022-11-27 DIAGNOSIS — M79671 Pain in right foot: Secondary | ICD-10-CM

## 2022-11-29 MED ORDER — LIDOCAINE 5 % EX PTCH
1.0000 | MEDICATED_PATCH | CUTANEOUS | 0 refills | Status: AC
Start: 2022-11-29 — End: ?

## 2022-12-01 NOTE — Progress Notes (Deleted)
Subjective:      Date: 12/06/2022 3:46 PM   Patient ID: Judy Aguirre is a 42 y.o. female.    Chief Complaint:  No chief complaint on file.      HPI: Patient is a 42 y/o female who presents for acute visit:      HPI     {NMG Additional Concerns:35282}    {Vanishing Tip Click a link below to be taken to that activity or part of the chart   Chart Review  Order Review  Review Flowsheets  Labs  Health Maintenance  Immunizations  Allergies  Medications  Problem List  History :55325}  Problem List:  Problem List[1]    Current Medications:  Medications Taking[2]    Allergies:  Allergies[3]    Past Medical History:  Medical History[4]    Past Surgical History:  Past Surgical History:   Procedure Laterality Date    D & E, SUCTION N/A 09/10/2016    Procedure: D & E, SUCTION;  Surgeon: Erenest Blank, MD;  Location: [facility];  Service: Gynecology;  Laterality: N/A;    DILATION AND CURETTAGE OF UTERUS      INDUCED ABORTION      x2    LEEP LLETZ  05/21/2013    Procedure: LEEP LLETZ;  Surgeon: Jeanie Cooks, MD;  Location: [facility];  Service: Gynecology;  Laterality: N/A;    VAGINAL DELIVERY  2000       Family History:  Family History   Problem Relation Age of Onset    Breast cancer Mother 19    Cancer Mother     No known problems Father     Breast cancer Paternal Aunt 53        Paternal half-aunt    Cancer Paternal Aunt     Breast cancer Cousin 63        Paternal half-cousin; Pt reports genetic testing was negative, report not reviewed    No known problems Son     Ovarian cancer Neg Hx        Social History:  Social History[5]       The following sections were reviewed this encounter by the provider:          ROS:  Review of Systems   General/Constitutional:   Denies Chills. Denies Fatigue. Denies Fever.   Ophthalmologic:   Denies Blurred vision.   ENT:   Denies Nasal Discharge. Denies Sinus pain. Denies Sore throat.   Respiratory:   Denies Cough. Denies Shortness of breath. Denies Wheezing.    Cardiovascular:   Denies Chest pain. Denies Chest pain with exertion. Denies Palpitations. Denies  Swelling in hands/feet.   Gastrointestinal:   Denies Abdominal pain. Denies Constipation. Denies Diarrhea. Denies Nausea. Denies  Vomiting.   Skin:   Denies Rash.   Neurologic:   Denies Dizziness. Denies Headache. Denies Tingling/Numbness.     Objective:   Vitals:  There were no vitals taken for this visit.      Physical Exam:  General Examination:   Physical Exam   GENERAL APPEARANCE: alert, in no acute distress, well developed, well nourished  oriented to time, place, and person.   ORAL CAVITY: normal oropharynx, normal lips, mucosa moist.   NECK/THYROID: neck supple, no carotid bruit, no neck mass palpated, no jugular venous distention, no thyromegaly.  LYMPH: no cervical/supraclavicular adenopathy   HEART: S1, S2 normal, no murmurs, rubs, gallops, regular rate and rhythm.   LUNGS: normal effort / no distress, normal breath  sounds, clear to auscultation  bilaterally, no wheezes, rales, rhonchi.   EXTREMITIES: No LE edema bilaterally.   PERIPHERAL PULSES: 2+ dorsalis pedis, 2+ posterior tibial bilaterally.   PSYCH: alert and oriented to time,place and person.   SKIN: moist, no focal rash    Assessment:       There are no diagnoses linked to this encounter.      Plan:         Follow-up:   No follow-ups on file.       Tunya Held, DO        [1]   Patient Active Problem List  Diagnosis    High grade squamous intraepithelial cervical dysplasia    History of preterm delivery    HSV-2 seropositive    Elderly multigravida, third trimester    Cystic hygroma    Breast lump    Abnormal findings on diagnostic imaging of breast    Family history of malignant neoplasm of breast    Vaginal discharge during pregnancy in third trimester    Encounter for screening for malignant neoplasm of breast, unspecified screening modality    Unintentional weight loss    Enlarged thyroid    Mass of joint of right shoulder   [2]   No  outpatient medications have been marked as taking for the 12/06/22 encounter (Appointment) with Tinnie Gens, Marci Polito, DO.   [3]   Allergies  Allergen Reactions    Penicillins Other (See Comments) and Rash     Has patient had a PCN reaction causing immediate rash, facial/tongue/throat swelling, SOB or lightheadedness with hypotension: Yes  Has patient had a PCN reaction causing severe rash involving mucus membranes or skin necrosis: No  Has patient had a PCN reaction that required hospitalization: Yes  Has patient had a PCN reaction occurring within the last 10 years: Yes  If all of the above answers are "NO", then may proceed with Cephalosporin use.  Was told by parent she was allergic as child   [4]   Past Medical History:  Diagnosis Date    Anemia     iron supplement during third trimester    Breast disorder 2019    benign lump left breast    Breast lump 10/27/2017    Breast lump 10/24/2017    Hyperlipidemia     Seasonal allergic rhinitis    [5]   Social History  Tobacco Use    Smoking status: Former     Types: Cigarettes    Smokeless tobacco: Former    Tobacco comments:     social    Advertising account planner    Vaping status: Never Used   Substance Use Topics    Alcohol use: Never    Drug use: No

## 2022-12-03 LAB — SURGICAL PATHOLOGY

## 2022-12-06 ENCOUNTER — Ambulatory Visit (INDEPENDENT_AMBULATORY_CARE_PROVIDER_SITE_OTHER): Payer: Medicaid Other | Admitting: Family Medicine

## 2022-12-13 NOTE — Progress Notes (Deleted)
Subjective:      Date: 12/15/2022 4:03 PM   Patient ID: Judy Aguirre is a 42 y.o. female.    Chief Complaint:  No chief complaint on file.    HPI: Patient is a 42 y/o female who presents for acute visit:      HPI     {NMG Additional Concerns:35282}    {Vanishing Tip Click a link below to be taken to that activity or part of the chart   Chart Review  Order Review  Review Flowsheets  Labs  Health Maintenance  Immunizations  Allergies  Medications  Problem List  History :55325}  Problem List:  Problem List[1]    Current Medications:  Medications Taking[2]    Allergies:  Allergies[3]    Past Medical History:  Medical History[4]    Past Surgical History:  Past Surgical History:   Procedure Laterality Date    D & E, SUCTION N/A 09/10/2016    Procedure: D & E, SUCTION;  Surgeon: Erenest Blank, MD;  Location: [facility];  Service: Gynecology;  Laterality: N/A;    DILATION AND CURETTAGE OF UTERUS      INDUCED ABORTION      x2    LEEP LLETZ  05/21/2013    Procedure: LEEP LLETZ;  Surgeon: Jeanie Cooks, MD;  Location: [facility];  Service: Gynecology;  Laterality: N/A;    VAGINAL DELIVERY  2000       Family History:  Family History   Problem Relation Age of Onset    Breast cancer Mother 63    Cancer Mother     No known problems Father     Breast cancer Paternal Aunt 44        Paternal half-aunt    Cancer Paternal Aunt     Breast cancer Cousin 78        Paternal half-cousin; Pt reports genetic testing was negative, report not reviewed    No known problems Son     Ovarian cancer Neg Hx        Social History:  Social History[5]       The following sections were reviewed this encounter by the provider:          ROS:  Review of Systems   General/Constitutional:   Denies Chills. Denies Fatigue. Denies Fever.   Ophthalmologic:   Denies Blurred vision.   ENT:   Denies Nasal Discharge. Denies Sinus pain. Denies Sore throat.   Respiratory:   Denies Cough. Denies Shortness of breath. Denies Wheezing.    Cardiovascular:   Denies Chest pain. Denies Chest pain with exertion. Denies Palpitations. Denies  Swelling in hands/feet.   Gastrointestinal:   Denies Abdominal pain. Denies Constipation. Denies Diarrhea. Denies Nausea. Denies  Vomiting.   Skin:   Denies Rash.   Neurologic:   Denies Dizziness. Denies Headache. Denies Tingling/Numbness.     Objective:   Vitals:  There were no vitals taken for this visit.      Physical Exam:  General Examination:   Physical Exam   GENERAL APPEARANCE: alert, in no acute distress, well developed, well nourished  oriented to time, place, and person.   ORAL CAVITY: normal oropharynx, normal lips, mucosa moist.   NECK/THYROID: neck supple, no carotid bruit, no neck mass palpated, no jugular venous distention, no thyromegaly.  LYMPH: no cervical/supraclavicular adenopathy   HEART: S1, S2 normal, no murmurs, rubs, gallops, regular rate and rhythm.   LUNGS: normal effort / no distress, normal breath sounds, clear  to auscultation  bilaterally, no wheezes, rales, rhonchi.   EXTREMITIES: No LE edema bilaterally.   PERIPHERAL PULSES: 2+ dorsalis pedis, 2+ posterior tibial bilaterally.   PSYCH: alert and oriented to time,place and person.   SKIN: moist, no focal rash    Assessment:       There are no diagnoses linked to this encounter.      Plan:         Follow-up:   No follow-ups on file.       Kylan Liberati, DO        [1]   Patient Active Problem List  Diagnosis    High grade squamous intraepithelial cervical dysplasia    History of preterm delivery    HSV-2 seropositive    Elderly multigravida, third trimester    Cystic hygroma    Breast lump    Abnormal findings on diagnostic imaging of breast    Family history of malignant neoplasm of breast    Vaginal discharge during pregnancy in third trimester    Encounter for screening for malignant neoplasm of breast, unspecified screening modality    Unintentional weight loss    Enlarged thyroid    Mass of joint of right shoulder   [2]   No  outpatient medications have been marked as taking for the 12/15/22 encounter (Appointment) with Tinnie Gens, Cris Talavera, DO.   [3]   Allergies  Allergen Reactions    Penicillins Other (See Comments) and Rash     Has patient had a PCN reaction causing immediate rash, facial/tongue/throat swelling, SOB or lightheadedness with hypotension: Yes  Has patient had a PCN reaction causing severe rash involving mucus membranes or skin necrosis: No  Has patient had a PCN reaction that required hospitalization: Yes  Has patient had a PCN reaction occurring within the last 10 years: Yes  If all of the above answers are "NO", then may proceed with Cephalosporin use.  Was told by parent she was allergic as child   [4]   Past Medical History:  Diagnosis Date    Anemia     iron supplement during third trimester    Breast disorder 2019    benign lump left breast    Breast lump 10/27/2017    Breast lump 10/24/2017    Hyperlipidemia     Seasonal allergic rhinitis    [5]   Social History  Tobacco Use    Smoking status: Former     Types: Cigarettes    Smokeless tobacco: Former    Tobacco comments:     social    Advertising account planner    Vaping status: Never Used   Substance Use Topics    Alcohol use: Never    Drug use: No

## 2022-12-15 ENCOUNTER — Ambulatory Visit (INDEPENDENT_AMBULATORY_CARE_PROVIDER_SITE_OTHER): Payer: Medicaid Other | Admitting: Family Medicine

## 2022-12-22 ENCOUNTER — Encounter (INDEPENDENT_AMBULATORY_CARE_PROVIDER_SITE_OTHER): Payer: Self-pay | Admitting: Family Medicine

## 2022-12-22 ENCOUNTER — Ambulatory Visit (INDEPENDENT_AMBULATORY_CARE_PROVIDER_SITE_OTHER): Payer: Medicaid Other | Admitting: Family Medicine

## 2022-12-22 VITALS — BP 103/68 | HR 73 | Temp 98.3°F

## 2022-12-22 DIAGNOSIS — J069 Acute upper respiratory infection, unspecified: Secondary | ICD-10-CM

## 2022-12-22 MED ORDER — AZITHROMYCIN 250 MG PO TABS
ORAL_TABLET | ORAL | 0 refills | Status: AC
Start: 2022-12-22 — End: 2022-12-27

## 2022-12-22 MED ORDER — BENZONATATE 200 MG PO CAPS
200.0000 mg | ORAL_CAPSULE | Freq: Three times a day (TID) | ORAL | 0 refills | Status: DC | PRN
Start: 2022-12-22 — End: 2023-06-21

## 2022-12-22 MED ORDER — COVID-19 AT HOME ANTIGEN TEST VI KIT
4.0000 | PACK | Freq: Every day | 0 refills | Status: DC | PRN
Start: 2022-12-22 — End: 2023-05-19

## 2022-12-22 NOTE — Progress Notes (Signed)
[facility]   OFFICE VISIT            HPI   Chief Complaint   Patient presents with    Otalgia     Right ear hurts more    Cough      Otalgia   Associated symptoms include coughing.   Cough  Associated symptoms include ear pain.     Patient is a 42 year old female with history of asthma who presents with a complaint of cough and ear pain for over 2 days.  It is associated with right-sided tonsil pain.  Denies associated symptoms including fever, chills, chest pain, dyspnea, nausea and vomiting, abdominal pain, diarrhea and fatigue.  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         ROS   Review of Systems   HENT:  Positive for ear pain.    Respiratory:  Positive for cough.          Vital Signs   BP 103/68   Pulse 73   Temp 98.3 F (36.8 C) (Temporal)   LMP 12/20/2022 (Exact Date)   SpO2 97%   Breastfeeding No      Physical Exam   Physical Exam  Constitutional:       Appearance: Normal appearance.   HENT:      Head: Normocephalic and atraumatic.      Right Ear: Tympanic membrane, ear canal and external ear normal.      Left Ear: Tympanic membrane, ear canal and external ear normal.      Nose: Nose normal.      Mouth/Throat:      Mouth: Mucous membranes are moist.      Pharynx: No oropharyngeal exudate or posterior oropharyngeal erythema.      Comments: Right tonsil enlargement with erythema and tenderness  Eyes:      Conjunctiva/sclera: Conjunctivae normal.      Pupils: Pupils are equal, round, and reactive to light.   Cardiovascular:      Rate and Rhythm: Normal rate and regular rhythm.      Pulses: Normal pulses.   Pulmonary:      Effort: Pulmonary effort is normal.      Breath sounds: Normal breath sounds. No wheezing.   Abdominal:      General: Abdomen is flat. Bowel sounds are normal.      Palpations: Abdomen is soft.      Tenderness: There is no abdominal tenderness.   Musculoskeletal:      Cervical back: Normal range of motion and neck supple. No  tenderness.   Lymphadenopathy:      Cervical: Cervical adenopathy present.   Neurological:      Mental Status: She is alert.           Assessment/Plan   1. Acute URI  - Sofia(TM) SARS COVID19 & Flu A/B POCT; Future  - POCT Rapid Group A Strep; Future  - benzonatate (TESSALON) 200 MG capsule; Take 1 capsule (200 mg) by mouth 3 (three) times daily as needed for Cough  Dispense: 30 capsule; Refill: 0  - azithromycin (Zithromax Z-Pak) 250 MG tablet; Take 2 tablets (500 mg) by mouth daily for 1 day, THEN 1 tablet (250 mg) daily for 4 days.  Dispense: 6 tablet; Refill: 0  - COVID-19 At Home Antigen Test Kit; 4 .application by In Vitro route daily as needed (Use as needed with upper respiratory symptoms)  Dispense: 2 kit; Refill: 0  -  Sofia(TM) SARS COVID19 & Flu A/B POCT  - POCT Rapid Group A Strep     negative influenza, COVID and strep test associated with 2 days of cough, ear pain, and right sided tonsillitis with history of asthma.  Physical exam benign except right-sided tonsillitis.  No red flag symptoms.  Likely viral than bacterial infection.  .  Start her with Z-Pak to help with symptoms and tonsillitis.  .  Treat the cough with benzonatate 200 mg 3 times daily as needed.  Also can add Mucinex in addition.  .  Treat the pain or fever with ibuprofen or Tylenol as needed.  .  1000 mg of vitamin C and 100 mg of zinc over-the-counter can improve the immune system and symptoms.  .  Hydration recommended.  .  Red flags includes chest pain, shortness of breath, nausea and vomiting, and fever that does not decrease with medication.    .  Follow-up as needed.

## 2022-12-23 ENCOUNTER — Encounter (INDEPENDENT_AMBULATORY_CARE_PROVIDER_SITE_OTHER): Payer: Medicaid Other | Admitting: Family Medicine

## 2023-05-19 ENCOUNTER — Ambulatory Visit (INDEPENDENT_AMBULATORY_CARE_PROVIDER_SITE_OTHER): Payer: Medicaid Other | Admitting: Family Medicine

## 2023-05-19 ENCOUNTER — Encounter (INDEPENDENT_AMBULATORY_CARE_PROVIDER_SITE_OTHER): Payer: Self-pay | Admitting: Family Medicine

## 2023-05-19 VITALS — BP 105/63 | HR 110 | Temp 100.0°F

## 2023-05-19 DIAGNOSIS — R197 Diarrhea, unspecified: Secondary | ICD-10-CM

## 2023-05-19 DIAGNOSIS — J029 Acute pharyngitis, unspecified: Secondary | ICD-10-CM

## 2023-05-19 DIAGNOSIS — R110 Nausea: Secondary | ICD-10-CM

## 2023-05-19 DIAGNOSIS — J101 Influenza due to other identified influenza virus with other respiratory manifestations: Secondary | ICD-10-CM

## 2023-05-19 DIAGNOSIS — R059 Cough, unspecified: Secondary | ICD-10-CM

## 2023-05-19 LAB — IHS AMB POCT SOFIA (TM) COVID-19 & FLU A/B
Sofia Influenza A Ag POCT: POSITIVE — AB
Sofia Influenza B Ag POCT: NEGATIVE
Sofia SARS COV2 Antigen POCT: NEGATIVE

## 2023-05-19 LAB — POCT RAPID STREP A: Rapid Strep A Screen POCT: NEGATIVE

## 2023-05-19 MED ORDER — ONDANSETRON 4 MG PO TBDP
4.0000 mg | ORAL_TABLET | Freq: Three times a day (TID) | ORAL | 0 refills | Status: AC | PRN
Start: 2023-05-19 — End: ?

## 2023-05-19 MED ORDER — BENZONATATE 100 MG PO CAPS
100.0000 mg | ORAL_CAPSULE | Freq: Three times a day (TID) | ORAL | 0 refills | Status: AC | PRN
Start: 2023-05-19 — End: 2023-08-17

## 2023-05-19 MED ORDER — OSELTAMIVIR PHOSPHATE 75 MG PO CAPS
75.0000 mg | ORAL_CAPSULE | Freq: Two times a day (BID) | ORAL | 0 refills | Status: AC
Start: 2023-05-19 — End: 2023-05-24

## 2023-05-19 MED ORDER — ACETAMINOPHEN 325 MG PO TABS
650.0000 mg | ORAL_TABLET | Freq: Once | ORAL | Status: AC
Start: 2023-05-19 — End: ?

## 2023-05-19 NOTE — Progress Notes (Signed)
 [facility]                       Date of Exam: 05/19/2023 11:31 AM        Patient ID: Judy Aguirre is a 43 y.o. female.  Attending Physician: Wilburn Hahn, MD        Chief Complaint:    Chief Complaint   Patient presents with    Sore Throat    Chills    Diarrhea    URI     For past two days              HPI:    HPI: Judy Aguirre is here with c/o nasal congestion, sore throat, fever, body aches, nausea, loose stools x 2 d. No known sick contacts.   No cough, no sob.          Problem List:    Problem List[1]          Current Meds:    Medications Taking[2]       Allergies:    Allergies[3]          Past Surgical History:    Past Surgical History[4]        Family History:    Family History[5]        Social History:    Social History[6]        The following sections were reviewed this encounter by the provider:   Tobacco  Allergies  Meds  Problems  Med Hx  Surg Hx  Fam Hx             Vital Signs:    BP 105/63 (BP Site: Left arm, Patient Position: Sitting)   Pulse (!) 110   Temp 100 F (37.8 C)   SpO2 96%          ROS:    Review of Systems   Constitutional:  Positive for fever.   HENT:  Positive for congestion. Negative for sinus pain and sore throat.    Respiratory:  Positive for cough. Negative for shortness of breath.    Gastrointestinal:  Positive for diarrhea and nausea.            Physical Exam:    Physical Exam  Vitals reviewed.   Constitutional:       Appearance: Normal appearance.   HENT:      Mouth/Throat:      Pharynx: No posterior oropharyngeal erythema.   Pulmonary:      Effort: Pulmonary effort is normal.      Breath sounds: Normal breath sounds.   Lymphadenopathy:      Cervical: No cervical adenopathy.   Neurological:      Mental Status: She is alert.             Assessment:    1. Influenza A  - acetaminophen  (TYLENOL ) tablet 650 mg  - oseltamivir  (TAMIFLU ) 75 MG capsule; Take 1 capsule (75 mg) by mouth 2 (two) times daily for 5 days  Dispense: 10 capsule;  Refill: 0    2. Sore throat  - Sofia(TM) SARS COVID19 & Flu A/B POCT; Future  - POCT Rapid Group A Strep; Future  - Sofia(TM) SARS COVID19 & Flu A/B POCT  - POCT Rapid Group A Strep    3. Cough, unspecified type  - benzonatate  (TESSALON ) 100 MG capsule; Take 1 capsule (100 mg) by mouth 3 (three) times daily as needed for Cough  Dispense: 30 capsule;  Refill: 0    4. Diarrhea, unspecified type    5. Nausea  - ondansetron  (ZOFRAN -ODT) 4 MG disintegrating tablet; Take 1 tablet (4 mg) by mouth every 8 (eight) hours as needed for Nausea  Dispense: 12 tablet; Refill: 0          Plan:    Flu A positive. Tamiflu  prescribed. Zofran , tessalon  prescribed for prn use.   Fluid hydration, supportive measures discussed.         Follow-up:    As needed        Wilburn Hahn, MD                     [1]   Patient Active Problem List  Diagnosis    High grade squamous intraepithelial cervical dysplasia    History of preterm delivery    HSV-2 seropositive    Elderly multigravida, third trimester    Cystic hygroma    Breast lump    Abnormal findings on diagnostic imaging of breast    Family history of malignant neoplasm of breast    Vaginal discharge during pregnancy in third trimester    Encounter for screening for malignant neoplasm of breast, unspecified screening modality    Unintentional weight loss    Enlarged thyroid     Mass of joint of right shoulder   [2]   Outpatient Medications Marked as Taking for the 05/19/23 encounter (Office Visit) with Hahn Wilburn, MD   Medication Sig Dispense Refill    albuterol  sulfate HFA (PROVENTIL ) 108 (90 Base) MCG/ACT inhaler Inhale 2 puffs into the lungs every 4 (four) hours as needed for Wheezing 1 each 0    fluticasone  (FLOVENT  HFA) 110 MCG/ACT inhaler Inhale 1 puff into the lungs 2 (two) times daily 1 each 0    lidocaine  (LIDODERM ) 5 % Place 1 patch onto the skin every 24 hours Remove & Discard patch within 12 hours or as directed by MD 10 patch 0   [3]   Allergies  Allergen Reactions     Penicillins Other (See Comments) and Rash     Has patient had a PCN reaction causing immediate rash, facial/tongue/throat swelling, SOB or lightheadedness with hypotension: Yes  Has patient had a PCN reaction causing severe rash involving mucus membranes or skin necrosis: No  Has patient had a PCN reaction that required hospitalization: Yes  Has patient had a PCN reaction occurring within the last 10 years: Yes  If all of the above answers are NO, then may proceed with Cephalosporin use.  Was told by parent she was allergic as child   [4]   Past Surgical History:  Procedure Laterality Date    D & E, SUCTION N/A 09/10/2016    Procedure: D & E, SUCTION;  Surgeon: Ladora Therisa Gelineau, MD;  Location: [facility];  Service: Gynecology;  Laterality: N/A;    DILATION AND CURETTAGE OF UTERUS      INDUCED ABORTION      x2    LEEP LLETZ  05/21/2013    Procedure: LEEP LLETZ;  Surgeon: Herbie Cherry, MD;  Location: [facility];  Service: Gynecology;  Laterality: N/A;    VAGINAL DELIVERY  2000   [5]   Family History  Problem Relation Name Age of Onset    Breast cancer Mother  21    Cancer Mother      No known problems Father      Breast cancer Paternal Aunt  95  Paternal half-aunt    Cancer Paternal Aunt      Breast cancer Cousin  98        Paternal half-cousin; Pt reports genetic testing was negative, report not reviewed    No known problems Son      Ovarian cancer Neg Hx     [6]   Social History  Tobacco Use    Smoking status: Former     Types: Cigarettes    Smokeless tobacco: Former    Tobacco comments:     [employer]    Vaping status: Never Used   Substance Use Topics    Alcohol use: Never    Drug use: No

## 2023-05-24 ENCOUNTER — Encounter (INDEPENDENT_AMBULATORY_CARE_PROVIDER_SITE_OTHER): Payer: Self-pay | Admitting: Family Medicine

## 2023-05-24 ENCOUNTER — Other Ambulatory Visit (INDEPENDENT_AMBULATORY_CARE_PROVIDER_SITE_OTHER): Payer: Self-pay | Admitting: Family Medicine

## 2023-05-24 DIAGNOSIS — J101 Influenza due to other identified influenza virus with other respiratory manifestations: Secondary | ICD-10-CM

## 2023-05-24 MED ORDER — BALOXAVIR MARBOXIL(40 MG DOSE) 1 X 40 MG PO TBPK
1.0000 | ORAL_TABLET | Freq: Once | ORAL | 0 refills | Status: AC
Start: 2023-05-24 — End: 2023-05-24

## 2023-05-24 NOTE — Progress Notes (Unsigned)
Was seen on 1/30 for acute symptoms

## 2023-05-30 ENCOUNTER — Ambulatory Visit (INDEPENDENT_AMBULATORY_CARE_PROVIDER_SITE_OTHER): Payer: Medicaid Other | Admitting: Family Medicine

## 2023-06-03 ENCOUNTER — Other Ambulatory Visit (INDEPENDENT_AMBULATORY_CARE_PROVIDER_SITE_OTHER): Payer: Self-pay | Admitting: Family Medicine

## 2023-06-03 DIAGNOSIS — J069 Acute upper respiratory infection, unspecified: Secondary | ICD-10-CM

## 2023-06-03 DIAGNOSIS — J4540 Moderate persistent asthma, uncomplicated: Secondary | ICD-10-CM

## 2023-06-03 DIAGNOSIS — R634 Abnormal weight loss: Secondary | ICD-10-CM

## 2023-06-03 DIAGNOSIS — R5383 Other fatigue: Secondary | ICD-10-CM

## 2023-06-03 MED ORDER — ALBUTEROL SULFATE HFA 108 (90 BASE) MCG/ACT IN AERS
2.0000 | INHALATION_SPRAY | RESPIRATORY_TRACT | 0 refills | Status: AC | PRN
Start: 2023-06-03 — End: ?

## 2023-06-03 NOTE — Telephone Encounter (Signed)
 Last office visit for foot pain 09/06/2022.     Due for physical.

## 2023-06-03 NOTE — Telephone Encounter (Signed)
 Already sent .[facility]

## 2023-06-03 NOTE — Telephone Encounter (Signed)
 RN sent refill to the Pharmacy. [facility]

## 2023-06-15 ENCOUNTER — Ambulatory Visit (INDEPENDENT_AMBULATORY_CARE_PROVIDER_SITE_OTHER): Payer: Medicaid Other | Admitting: Student in an Organized Health Care Education/Training Program

## 2023-06-21 ENCOUNTER — Ambulatory Visit (INDEPENDENT_AMBULATORY_CARE_PROVIDER_SITE_OTHER): Admitting: Family Medicine

## 2023-06-21 ENCOUNTER — Encounter (INDEPENDENT_AMBULATORY_CARE_PROVIDER_SITE_OTHER): Payer: Self-pay | Admitting: Family Medicine

## 2023-06-21 VITALS — BP 123/81 | HR 92 | Temp 98.2°F | Wt 146.6 lb

## 2023-06-21 DIAGNOSIS — J029 Acute pharyngitis, unspecified: Secondary | ICD-10-CM

## 2023-06-21 DIAGNOSIS — R059 Cough, unspecified: Secondary | ICD-10-CM

## 2023-06-21 LAB — POCT RAPID STREP A: Rapid Strep A Screen POCT: NEGATIVE

## 2023-06-21 MED ORDER — AZITHROMYCIN 250 MG PO TABS
ORAL_TABLET | ORAL | 0 refills | Status: AC
Start: 2023-06-21 — End: 2023-06-28

## 2023-06-21 NOTE — Progress Notes (Signed)
 [facility]                       Date of Exam: 06/21/2023 3:40 PM        Patient ID: Judy Aguirre is a 43 y.o. female.  Attending Physician: Wilburn Hahn, MD        Chief Complaint:    Chief Complaint   Patient presents with    Sore Throat     Last two days    Cough             HPI:    HPI: Judy Aguirre is here with c/o sore throat and worsening cough x 3 d.   No fever. OTC motrin  not helping.          Problem List:    Problem List[1]          Current Meds:    Medications Taking[2]       Allergies:    Allergies[3]          Past Surgical History:    Past Surgical History[4]        Family History:    Family History[5]        Social History:    Social History[6]        The following sections were reviewed this encounter by the provider:   Tobacco  Allergies  Meds  Problems  Med Hx  Surg Hx  Fam Hx             Vital Signs:    BP 123/81   Pulse 92   Temp 98.2 F (36.8 C)   Wt 66.5 kg (146 lb 9.6 oz)   SpO2 98%   BMI 22.96 kg/m          ROS:    Review of Systems   Constitutional:  Negative for fever.   HENT:  Positive for sore throat. Negative for congestion, ear pain and sinus pain.    Respiratory:  Positive for cough. Negative for shortness of breath.    Gastrointestinal:  Negative for nausea and vomiting.   Neurological:  Negative for dizziness.            Physical Exam:    Physical Exam  Vitals reviewed.   Constitutional:       Appearance: Normal appearance.   HENT:      Mouth/Throat:      Pharynx: Posterior oropharyngeal erythema present.   Pulmonary:      Effort: Pulmonary effort is normal.      Breath sounds: Normal breath sounds.   Lymphadenopathy:      Cervical: No cervical adenopathy.   Neurological:      Mental Status: She is alert.              Assessment:    1. Sore throat  - POCT Rapid Group A Strep; Future  - POCT Rapid Group A Strep  - azithromycin  (ZITHROMAX ) 250 MG tablet; Take two 250mg  tabs PO day one then one 250mg  tab daily x 4 days  Dispense: 6 tablet;  Refill: 0    2. Cough, unspecified type          Plan:    Rapid strep neg. Antibiotic prescribed for empiric treatment. Supportive measures discussed.        Follow-up:    As needed         Wilburn Hahn, MD                     [  1]   Patient Active Problem List  Diagnosis    High grade squamous intraepithelial cervical dysplasia    History of preterm delivery    HSV-2 seropositive    Elderly multigravida, third trimester    Cystic hygroma    Breast lump    Abnormal findings on diagnostic imaging of breast    Family history of malignant neoplasm of breast    Vaginal discharge during pregnancy in third trimester    Encounter for screening for malignant neoplasm of breast, unspecified screening modality    Unintentional weight loss    Enlarged thyroid     Mass of joint of right shoulder   [2]   Outpatient Medications Marked as Taking for the 06/21/23 encounter (Office Visit) with Jannetta Barlow, MD   Medication Sig Dispense Refill    albuterol  sulfate HFA (PROVENTIL ) 108 (90 Base) MCG/ACT inhaler Inhale 2 puffs into the lungs every 4 (four) hours as needed for Wheezing 1 each 0    benzonatate  (TESSALON ) 100 MG capsule Take 1 capsule (100 mg) by mouth 3 (three) times daily as needed for Cough 30 capsule 0    benzonatate  (TESSALON ) 200 MG capsule Take 1 capsule (200 mg) by mouth 3 (three) times daily as needed for Cough 30 capsule 0    fluticasone  (FLOVENT  HFA) 110 MCG/ACT inhaler Inhale 1 puff into the lungs 2 (two) times daily 1 each 0    lidocaine  (LIDODERM ) 5 % Place 1 patch onto the skin every 24 hours Remove & Discard patch within 12 hours or as directed by MD 10 patch 0    ondansetron  (ZOFRAN -ODT) 4 MG disintegrating tablet Take 1 tablet (4 mg) by mouth every 8 (eight) hours as needed for Nausea 12 tablet 0     Current Facility-Administered Medications for the 06/21/23 encounter (Office Visit) with Jannetta Barlow, MD   Medication Dose Route Frequency Provider Last Rate Last Admin    acetaminophen  (TYLENOL ) tablet 650 mg   650 mg Oral Once Tonnie Friedel, MD       [3]   Allergies  Allergen Reactions    Penicillins Other (See Comments) and Rash     Has patient had a PCN reaction causing immediate rash, facial/tongue/throat swelling, SOB or lightheadedness with hypotension: Yes  Has patient had a PCN reaction causing severe rash involving mucus membranes or skin necrosis: No  Has patient had a PCN reaction that required hospitalization: Yes  Has patient had a PCN reaction occurring within the last 10 years: Yes  If all of the above answers are NO, then may proceed with Cephalosporin use.  Was told by parent she was allergic as child   [4]   Past Surgical History:  Procedure Laterality Date    D & E, SUCTION N/A 09/10/2016    Procedure: D & E, SUCTION;  Surgeon: Ladora Therisa Gelineau, MD;  Location: [facility];  Service: Gynecology;  Laterality: N/A;    DILATION AND CURETTAGE OF UTERUS      INDUCED ABORTION      x2    LEEP LLETZ  05/21/2013    Procedure: LEEP LLETZ;  Surgeon: Herbie Cherry, MD;  Location: [facility];  Service: Gynecology;  Laterality: N/A;    VAGINAL DELIVERY  2000   [5]   Family History  Problem Relation Name Age of Onset    Breast cancer Mother  59    Cancer Mother      No known problems Father      Breast cancer Paternal  Aunt  42        Paternal half-aunt    Cancer Paternal Aunt      Breast cancer Cousin  74        Paternal half-cousin; Pt reports genetic testing was negative, report not reviewed    No known problems Son      Ovarian cancer Neg Hx     [6]   Social History  Tobacco Use    Smoking status: Former     Types: Cigarettes    Smokeless tobacco: Former    Tobacco comments:     Technical Brewer    Vaping status: Never Used   Substance Use Topics    Alcohol use: Never    Drug use: No

## 2023-09-21 ENCOUNTER — Encounter (INDEPENDENT_AMBULATORY_CARE_PROVIDER_SITE_OTHER): Payer: Self-pay | Admitting: Family Medicine

## 2023-09-21 ENCOUNTER — Telehealth (INDEPENDENT_AMBULATORY_CARE_PROVIDER_SITE_OTHER): Admitting: Family Medicine

## 2023-09-21 DIAGNOSIS — R634 Abnormal weight loss: Secondary | ICD-10-CM

## 2023-09-21 DIAGNOSIS — R1031 Right lower quadrant pain: Secondary | ICD-10-CM

## 2023-09-21 NOTE — Progress Notes (Signed)
 [facility]                       Date of Exam: 09/21/2023 3:52 PM        Patient ID: Judy Aguirre is a 43 y.o. female.  Attending Physician: Wilburn Hahn, MD        Chief Complaint:    Chief Complaint   Patient presents with    Abdominal Pain     Right side, been going on for a while        Verbal consent has been obtained from the patient to conduct this video visit encounter to minimize exposure to COVID-19.        HPI:    HPI: Ms. Perfetti with h/o allergic rhinitis, asthma, R kidney angiomyolipoma, cervical LEEP (2015), smoking, HL is here with c/o R sided mid-lower abdominal pain x 2 months.   Pain is intermittent, sharp, 4-7/10, occurs few times a week, lasts a few hours. Pain radiates to the back. No medications taken for this.   No n/v/d/c, dysuria. She has an IUD since 2022.   C/o unintentional weight loss for last 1 yr. Baseline weight was 170 lb. It gradually dropped to 155 lb. Weight suddenly dropped to 145 lb over last month. No anxiety, depression.   Mammogram benign 08/11/23. Pap smear neg 06/2023. CT chest 08/2022 showed stable 3 mm pulmonary nodule since 2022.          Problem List:    Problem List[1]          Current Meds:    Medications Taking[2]       Allergies:    Allergies[3]          Past Surgical History:    Past Surgical History[4]        Family History:    Family History[5]        Social History:    Social History[6]        The following sections were reviewed this encounter by the provider:   Tobacco  Allergies  Meds  Problems  Med Hx  Surg Hx  Fam Hx             Vital Signs:    There were no vitals taken for this visit.         ROS:    Review of Systems   Constitutional:  Positive for unexpected weight change. Negative for fatigue and fever.   HENT:  Negative for sore throat.    Respiratory:  Negative for cough and shortness of breath.    Cardiovascular:  Negative for chest pain and palpitations.   Gastrointestinal:  Negative for blood in stool,  constipation, diarrhea, nausea and vomiting.   Genitourinary:  Negative for dysuria and vaginal bleeding.   Neurological:  Negative for dizziness and headaches.   Psychiatric/Behavioral:  Negative for behavioral problems.             Physical Exam:    Physical Exam   GENERAL APPEARANCE: alert, in no acute distress, well developed        Assessment:    1. Right lower quadrant abdominal pain  - CBC with Differential (Order); Future  - Comprehensive Metabolic Panel; Future  - TSH; Future  - Hemoglobin A1C; Future  - Lipase; Future  - Celiac Disease Comprehensive Panel; Future  - CT Abdomen Pelvis W IV/ WO PO Cont; Future  - Lipase  - Celiac Disease Comprehensive Panel  2. Unintentional weight loss  - ANA IFA with Reflex to Titer/Pattern/Antibody; Future  - CT Abdomen Pelvis W IV/ WO PO Cont; Future  - ANA IFA with Reflex to Titer/Pattern/Antibody          Plan:    Labs and CT abdomen ordered as above for further evaluation.  Mammogram benign 08/11/23. Pap smear neg 06/2023. CT chest 08/2022 showed stable 3 mm pulmonary nodule since 2022.  OTC analgesics as needed, activity as tolerated recommended.  Monitor weekly weights.  Eat 3 meals a day.  Add 1-2 snacks in between meals.         Follow-up:    4-6 weeks         Wilburn Hahn, MD                     [1]   Patient Active Problem List  Diagnosis    High grade squamous intraepithelial cervical dysplasia    History of preterm delivery    HSV-2 seropositive    Elderly multigravida, third trimester    Cystic hygroma    Breast lump    Abnormal findings on diagnostic imaging of breast    Family history of malignant neoplasm of breast    Vaginal discharge during pregnancy in third trimester    Encounter for screening for malignant neoplasm of breast, unspecified screening modality    Unintentional weight loss    Enlarged thyroid     Mass of joint of right shoulder   [2]   Outpatient Medications Marked as Taking for the 09/21/23 encounter (Telemedicine Visit) with Hahn Wilburn,  MD   Medication Sig Dispense Refill    albuterol  sulfate HFA (PROVENTIL ) 108 (90 Base) MCG/ACT inhaler Inhale 2 puffs into the lungs every 4 (four) hours as needed for Wheezing 1 each 0    benzonatate  (TESSALON ) 100 MG capsule Take 1 capsule (100 mg) by mouth 3 (three) times daily as needed for Cough 30 capsule 0    fluticasone  (FLONASE ) 50 MCG/ACT nasal spray 2 sprays by Nasal route daily for 7 days 9.9 mL 0    fluticasone  (FLOVENT  HFA) 110 MCG/ACT inhaler Inhale 1 puff into the lungs 2 (two) times daily 1 each 0    lidocaine  (LIDODERM ) 5 % Place 1 patch onto the skin every 24 hours Remove & Discard patch within 12 hours or as directed by MD 10 patch 0    ondansetron  (ZOFRAN -ODT) 4 MG disintegrating tablet Take 1 tablet (4 mg) by mouth every 8 (eight) hours as needed for Nausea 12 tablet 0     Current Facility-Administered Medications for the 09/21/23 encounter (Telemedicine Visit) with Hahn Wilburn, MD   Medication Dose Route Frequency Provider Last Rate Last Admin    acetaminophen  (TYLENOL ) tablet 650 mg  650 mg Oral Once Aneesha Holloran, MD       [3]   Allergies  Allergen Reactions    Penicillins Other (See Comments) and Rash     Has patient had a PCN reaction causing immediate rash, facial/tongue/throat swelling, SOB or lightheadedness with hypotension: Yes  Has patient had a PCN reaction causing severe rash involving mucus membranes or skin necrosis: No  Has patient had a PCN reaction that required hospitalization: Yes  Has patient had a PCN reaction occurring within the last 10 years: Yes  If all of the above answers are NO, then may proceed with Cephalosporin use.  Was told by parent she was allergic as child   [4]   Past Surgical  History:  Procedure Laterality Date    D & E, SUCTION N/A 09/10/2016    Procedure: D & E, SUCTION;  Surgeon: Ladora Therisa Gelineau, MD;  Location: [facility];  Service: Gynecology;  Laterality: N/A;    DILATION AND CURETTAGE OF UTERUS      INDUCED ABORTION      x2    LEEP LLETZ   05/21/2013    Procedure: LEEP LLETZ;  Surgeon: Herbie Cherry, MD;  Location: [facility];  Service: Gynecology;  Laterality: N/A;    VAGINAL DELIVERY  2000   [5]   Family History  Problem Relation Name Age of Onset    Breast cancer Mother  38    Cancer Mother      No known problems Father      Breast cancer Paternal Aunt  66        Paternal half-aunt    Cancer Paternal Aunt      Breast cancer Cousin  47        Paternal half-cousin; Pt reports genetic testing was negative, report not reviewed    No known problems Son      Ovarian cancer Neg Hx     [6]   Social History  Tobacco Use    Smoking status: Former     Types: Cigarettes    Smokeless tobacco: Former    Tobacco comments:     social    Advertising account planner    Vaping status: Never Used   Substance Use Topics    Alcohol use: Never    Drug use: No

## 2023-09-22 ENCOUNTER — Encounter (INDEPENDENT_AMBULATORY_CARE_PROVIDER_SITE_OTHER): Payer: Self-pay | Admitting: Family Medicine

## 2023-09-22 DIAGNOSIS — R1031 Right lower quadrant pain: Secondary | ICD-10-CM

## 2023-09-29 ENCOUNTER — Ambulatory Visit (INDEPENDENT_AMBULATORY_CARE_PROVIDER_SITE_OTHER): Payer: Self-pay | Admitting: Family Medicine

## 2023-09-29 LAB — CBC AND DIFFERENTIAL
Baso(Absolute): 0.1 x10E3/uL (ref 0.0–0.2)
Basophils Automated: 1 %
Eosinophils Absolute: 0.3 x10E3/uL (ref 0.0–0.4)
Eosinophils Automated: 4 %
Hematocrit: 46 % (ref 34.0–46.6)
Hemoglobin: 14.8 g/dL (ref 11.1–15.9)
Immature Granulocytes Absolute: 0 x10E3/uL (ref 0.0–0.1)
Immature Granulocytes: 0 %
Lymphocytes Absolute: 2.2 x10E3/uL (ref 0.7–3.1)
Lymphocytes Automated: 28 %
MCH: 29 pg (ref 26.6–33.0)
MCHC: 32.2 g/dL (ref 31.5–35.7)
MCV: 90 fL (ref 79–97)
Monocytes Absolute: 0.5 x10E3/uL (ref 0.1–0.9)
Monocytes: 6 %
Neutrophils Absolute Count: 5 x10E3/uL (ref 1.4–7.0)
Neutrophils: 61 %
Platelets: 268 x10E3/uL (ref 150–450)
RBC: 5.11 x10E6/uL (ref 3.77–5.28)
RDW: 12 % (ref 11.7–15.4)
WBC: 8.1 x10E3/uL (ref 3.4–10.8)

## 2023-09-29 LAB — COMPREHENSIVE METABOLIC PANEL
ALT: 17 IU/L (ref 0–32)
AST (SGOT): 13 IU/L (ref 0–40)
Albumin: 4.6 g/dL (ref 3.9–4.9)
Alkaline Phosphatase: 53 IU/L (ref 44–121)
BUN / Creatinine Ratio: 17 (ref 9–23)
BUN: 12 mg/dL (ref 6–24)
Bilirubin, Total: 0.4 mg/dL (ref 0.0–1.2)
CO2: 22 mmol/L (ref 20–29)
Calcium: 9.1 mg/dL (ref 8.7–10.2)
Chloride: 100 mmol/L (ref 96–106)
Creatinine: 0.71 mg/dL (ref 0.57–1.00)
Globulin, Total: 2.4 g/dL (ref 1.5–4.5)
Glucose: 88 mg/dL (ref 70–99)
Potassium: 4 mmol/L (ref 3.5–5.2)
Protein, Total: 7 g/dL (ref 6.0–8.5)
Sodium: 137 mmol/L (ref 134–144)
eGFR: 109 mL/min/1.73 (ref >59)

## 2023-09-29 LAB — LIPASE: Lipase: 68 U/L (ref 14–72)

## 2023-09-29 LAB — TSH: TSH: 0.981 u[IU]/mL (ref 0.450–4.500)

## 2023-10-01 LAB — CELIAC DISEASE COMPREHENSIVE PANEL
Endomysial IgA: NEGATIVE
Immunoglobulin A: 117 mg/dL (ref 87–352)
Transglutaminase IgA: <2 U/mL (ref 0–3)

## 2023-10-04 ENCOUNTER — Other Ambulatory Visit (INDEPENDENT_AMBULATORY_CARE_PROVIDER_SITE_OTHER): Payer: Self-pay | Admitting: Family Medicine

## 2023-10-04 ENCOUNTER — Ambulatory Visit (INDEPENDENT_AMBULATORY_CARE_PROVIDER_SITE_OTHER): Payer: Self-pay | Admitting: Family Medicine

## 2023-10-04 DIAGNOSIS — N9489 Other specified conditions associated with female genital organs and menstrual cycle: Secondary | ICD-10-CM

## 2023-10-05 LAB — ANA IFA WITH REFLEX TO TITER/PATTERN/ANTIBODY: Antinuclear Antibodies (ANA): NEGATIVE

## 2023-10-12 ENCOUNTER — Ambulatory Visit
Admission: RE | Admit: 2023-10-12 | Discharge: 2023-10-12 | Disposition: A | Discharge status: Home / Self Care | Source: Ambulatory Visit | Attending: Family Medicine | Admitting: Family Medicine

## 2023-10-12 DIAGNOSIS — N9489 Other specified conditions associated with female genital organs and menstrual cycle: Secondary | ICD-10-CM | POA: Insufficient documentation

## 2023-10-14 ENCOUNTER — Ambulatory Visit (INDEPENDENT_AMBULATORY_CARE_PROVIDER_SITE_OTHER): Payer: Self-pay | Admitting: Family Medicine

## 2023-10-21 ENCOUNTER — Encounter (INDEPENDENT_AMBULATORY_CARE_PROVIDER_SITE_OTHER): Payer: Self-pay | Admitting: Family Medicine

## 2023-10-21 ENCOUNTER — Encounter (INDEPENDENT_AMBULATORY_CARE_PROVIDER_SITE_OTHER): Payer: Self-pay | Admitting: Nurse Practitioner

## 2023-10-24 LAB — HEMOGLOBIN A1C: Hemoglobin A1C: 5.6 % (ref 4.8–5.6)

## 2023-12-02 ENCOUNTER — Encounter (INDEPENDENT_AMBULATORY_CARE_PROVIDER_SITE_OTHER): Payer: Self-pay | Admitting: Family Medicine

## 2023-12-24 ENCOUNTER — Emergency Department
Admission: EM | Admit: 2023-12-24 | Discharge: 2023-12-24 | Disposition: A | Discharge status: Home / Self Care | Attending: Emergency Medicine | Admitting: Emergency Medicine

## 2023-12-24 ENCOUNTER — Emergency Department

## 2023-12-24 DIAGNOSIS — R079 Chest pain, unspecified: Secondary | ICD-10-CM

## 2023-12-24 DIAGNOSIS — W010XXA Fall on same level from slipping, tripping and stumbling without subsequent striking against object, initial encounter: Secondary | ICD-10-CM | POA: Insufficient documentation

## 2023-12-24 DIAGNOSIS — S20211A Contusion of right front wall of thorax, initial encounter: Secondary | ICD-10-CM | POA: Insufficient documentation

## 2023-12-24 LAB — POCT PREGNANCY TEST, URINE HCG: POCT Pregnancy HCG Test, UR: NEGATIVE

## 2023-12-24 LAB — ECG 12-LEAD
Atrial Rate: 70 {beats}/min
IHS MUSE NARRATIVE AND IMPRESSION: NORMAL
P Axis: 30 degrees
P-R Interval: 162 ms
Q-T Interval: 370 ms
QRS Duration: 74 ms
QTC Calculation (Bezet): 399 ms
R Axis: 52 degrees
T Axis: 23 degrees
Ventricular Rate: 70 {beats}/min

## 2023-12-24 MED ORDER — LIDOCAINE 5 % EX PTCH
1.0000 | MEDICATED_PATCH | CUTANEOUS | 0 refills | Status: AC
Start: 2023-12-24 — End: ?

## 2023-12-24 MED ORDER — CYCLOBENZAPRINE HCL 5 MG PO TABS
5.0000 mg | ORAL_TABLET | Freq: Two times a day (BID) | ORAL | 0 refills | Status: AC | PRN
Start: 2023-12-24 — End: 2024-01-08

## 2023-12-24 MED ORDER — LIDOCAINE 5 % EX PTCH
1.0000 | MEDICATED_PATCH | Freq: Once | CUTANEOUS | Status: DC
Start: 2023-12-24 — End: 2023-12-24
  Administered 2023-12-24: 1 via TRANSDERMAL
  Filled 2023-12-24: qty 1

## 2023-12-24 MED ORDER — IBUPROFEN 600 MG PO TABS
600.0000 mg | ORAL_TABLET | Freq: Four times a day (QID) | ORAL | 0 refills | Status: AC | PRN
Start: 2023-12-24 — End: ?

## 2023-12-24 MED ORDER — KETOROLAC TROMETHAMINE 30 MG/ML IJ SOLN
30.0000 mg | Freq: Once | INTRAMUSCULAR | Status: AC
Start: 2023-12-24 — End: 2023-12-24
  Administered 2023-12-24: 30 mg via INTRAMUSCULAR
  Filled 2023-12-24: qty 1

## 2023-12-24 NOTE — ED Provider Notes (Signed)
 Date Time: 12/24/23 5:36 PM   Patient Name: Judy Aguirre   Attending Physician: Dr. Sheena Senters, M.D.    Attending Note:   Midlevel Provider Attestation:   The patient was seen and examined by the mid-level (resident, physician assistant or nurse practitioner) and the plan of care was discussed with me. I personally saw the patient and made/approved the management plan and take responsibility for the patient management.  Please see the separately documented midlevel note for additional information including but not limited to full history of present illness, review of systems and comprehensive physical exam.  Selected historical findings: 43 yo F w/ no contributing PMH who presents w/ R sided chest discomfort.  Patient states 2 weeks ago she had mechanical fall after slipping on wet floor while getting in elevator.  States landed to R chest wall.  Did not strike head.  States persistent pain to R chest wall, worse w/ movement.    Selected physical examination findings: AF VSS Completely nontoxic appearing.  Discomfort w/ palpation to R anterior chest wall.  CTAB.  Abd soft, NT.    Assessment: Chest wall contusion.    Plan: Completed CXR w/ rib films.  No fracture noted.  No other traumatic injuries identified.  I believe that patient is currently safe for d/c home.        ____________________________________________________________________    I was acting as a scribe for Sheena Senters, M.D. on Judy Aguirre,Judy Aguirre        I am the first provider for this patient and I personally performed the services documented.  is scribing for me on Judy Aguirre,Judy Aguirre. This note accurately reflects work and decisions made by me.  Sheena Senters, M.D.  ____________________________________________________________________      Senters Sheena Boas, MD  12/24/23 [phone]

## 2023-12-24 NOTE — Discharge Instructions (Signed)
 Dear Ms. Estelle:    Thank you for choosing the [facility] Emergency Department, the premier emergency department in the [place] area.  I hope your visit today was EXCELLENT. You will receive a survey via text message that will give you the opportunity to provide feedback to your team about your visit. Please do not hesitate to reach out with any questions!    Specific instructions for your visit today:      IF YOU DO NOT CONTINUE TO IMPROVE OR YOUR CONDITION WORSENS, PLEASE CONTACT YOUR DOCTOR OR RETURN IMMEDIATELY TO THE EMERGENCY DEPARTMENT.    Sincerely,  Billy Sheena Boas, MD  Attending Emergency Physician  [facility] Emergency Department      OBTAINING A PRIMARY CARE APPOINTMENT    Primary care physicians (PCPs, also known as primary care doctors) are either internists or family medicine doctors. Both types of PCPs focus on health promotion, disease prevention, patient education and counseling, and treatment of acute and chronic medical conditions.    If you need a primary care doctor, please call the below number and ask who is receiving new patients.     [facility]  Telephone:  [phone]  [email or web address]    DOCTOR REFERRALS  Call [phone] (available 24 hours a day, 7 days a week) if you need any further referrals and we can help you find a primary care doctor or specialist.  Referral services are also available online at:  [email or web address] Please reach out to our ED Care Management Specialist, Kacie Oneal, if you encounter any difficulty in scheduling your [facility]ist follow up appointment.  Kacie Oneal can be reached at [phone] or [email or web address].    YOUR CONTACT INFORMATION  Before leaving please check with registration to make sure we have an up-to-date contact number.  You can call registration at [phone] to update your information.  For questions about your hospital bill, please call [phone].  For questions about your  Emergency Dept Physician bill please call [phone].      FREE HEALTH SERVICES  If you need help with health or social services, please call 2-1-1 for a free referral to resources in your area.  2-1-1 is a free service connecting people with information on health insurance, free clinics, pregnancy, mental health, dental care, food assistance, housing, and substance abuse counseling.  Also, available online at:  http://www.[facility].org    ORTHOPEDIC INJURY   Please know that significant injuries can exist even when an initial x-ray is read as normal or negative.  This can occur because some fractures (broken bones) are not initially visible on x-rays.  For this reason, close outpatient follow-up with your primary care doctor or bone specialist (orthopedist) is required.    MEDICATIONS AND FOLLOWUP  Please be aware that some prescription medications can cause drowsiness.  Use caution when driving or operating machinery.    The examination and treatment you have received in our Emergency Department is provided on an emergency basis, and is not intended to be a substitute for your primary care physician.  It is important that your doctor checks you again and that you report any new or remaining problems at that time.      ASSISTANCE WITH INSURANCE    Affordable Care Act  [facility])  Call to start or finish an application, compare plans, enroll or ask a question.  [phone]  TTY: (916) 807-4812  Web:  Healthcare.gov    Help Enrolling in [facility]  [employer] [place]   ([phone] (TOLL-FREE)  858-435-1421)  778-8409 (TTY)  Web:  Http://www.coverva.org    Local Help Enrolling in the [employer]CA  [place]  Family Service  (203)418-3474 (MAIN)  Email:  health-help@nvfs .org  Web:  BlackjackMyths.is  Address:  [address] [place], [place] [address]    SEDATING MEDICATIONS  Sedating medications include strong pain medications (e.g. narcotics), muscle relaxers, benzodiazepines (used for anxiety and as muscle  relaxers), Benadryl /diphenhydramine  and other antihistamines for allergic reactions/itching, and other medications.  If you are unsure if you have received a sedating medication, please ask your physician or nurse.  If you received a sedating medication: DO NOT drive a car. DO NOT operate machinery. DO NOT perform jobs where you need to be alert.  DO NOT drink alcoholic beverages while taking this medicine.     If you get dizzy, sit or lie down at the first signs. Be careful going up and down stairs.  Be extra careful to prevent falls.     Never give this medicine to others.     Keep this medicine out of reach of children.     Do not take or save old medicines. Throw them away when outdated.     Keep all medicines in a cool, dry place. DO NOT keep them in your bathroom medicine cabinet or in a cabinet above the stove.    MEDICATION REFILLS  Please be aware that we cannot refill any prescriptions through the ER. If you need further treatment from what is provided at your ER visit, please follow up with your primary care doctor or your pain management specialist.    FREESTANDING EMERGENCY DEPARTMENTS OF [facility]  Did you know [facility] has two freestanding ERs located just a few miles away?  [facility] and [facility] [place] have short wait times, easy free parking directly in front of the building and top patient satisfaction scores - and the same Board Certified Emergency Medicine doctors as [facility].

## 2023-12-24 NOTE — ED Provider Notes (Signed)
 [facility]  Emergency Department APP H&P     Diagnosis/Disposition   ED Disposition:  Discharge    ED Diagnosis:  Contusion of rib on right side, initial encounter    Discharge Prescription    CYCLOBENZAPRINE  (FLEXERIL ) 5 MG TABLET    Take 1 tablet (5 mg) by mouth 2 (two) times daily as needed for Muscle spasms    IBUPROFEN  (ADVIL ) 600 MG TABLET    Take 1 tablet (600 mg) by mouth every 6 (six) hours as needed for Pain    LIDOCAINE  (LIDODERM ) 5 %    Place 1 patch onto the skin in the morning. Remove & Discard patch within 12 hours or as directed by MD.           History of Present Illness     Nursing Triage Note: Slipped and fell on chest two weeks ago in an elevator. CP present and states that she was really sick from having a cold and is also having a pain post fall. Continues to have R side chest pain that is sharp and piercing. Shortness of breath with ambulation and at rest and states she does not want to talk due to shortness of breath. Denies fevers at this time.  Chief Complaint: Chest Pain, Chest Injury, and Chest congestion    HPI 43 y.o.female with no significant pmh presents to ED for right sided chest wall pain, pt reports slip and fall on wet floor 2 weeks ago, reports purely mechanical fall. Denies head strike, no LOC, denies thinner use. Pt reports pain to R. Side of chest, worse with inspiration. Pt reports contracting viral illness over past two weeks and kids at home are also sick. Denies any fevers, chills, n/v. Denies neck or back pain, no sensation changes. Denies any weakness, no incontinence or retention.       Physical Exam   Pulse 84  BP 120/83  Resp 18  SpO2 97 %  Temp 98 F (36.7 C)     Physical Exam  Vitals and nursing note reviewed.   Constitutional:       General: She is not in acute distress.     Appearance: Normal appearance. She is well-developed. She is not ill-appearing.   HENT:      Head: Normocephalic and atraumatic.   Cardiovascular:      Rate and  Rhythm: Normal rate.      Pulses: Normal pulses.      Heart sounds: Heart sounds not distant.   Pulmonary:      Effort: Pulmonary effort is normal. No tachypnea, accessory muscle usage or respiratory distress.      Breath sounds: No stridor. No decreased breath sounds, wheezing, rhonchi or rales.   Chest:      Chest wall: Tenderness present.      Comments: Right sided chest wall tenderness  No bruising noted  Lungs are clear bilaterally   No flailing or crepitus of chest wall   No deformities noted  Abdominal:      General: Abdomen is flat.      Tenderness: There is no abdominal tenderness.   Musculoskeletal:         General: Tenderness present. No signs of injury. Normal range of motion.      Cervical back: Normal range of motion.      Right lower leg: No edema.      Left lower leg: No edema.   Skin:     General: Skin is warm.  Capillary Refill: Capillary refill takes less than 2 seconds.   Neurological:      General: No focal deficit present.      Mental Status: She is alert.   Psychiatric:         Mood and Affect: Mood normal.            Medical Decision Making     Initial Differential Diagnosis:  Initial differential diagnosis to include but not limited to: fracture, sprain, bruising, ligamentous injury, tendon injury, muscle injury, dislocation  Plan:  CXR  IM toradol , Lidoderm  patch  Incentive spirometer     Final Impression:  The patient was deemed stable for discharge. They were given strict return precautions as it relates to their presumed diagnosis, verbalized understanding of these precautions and agreed to follow up as instructed. All questions were answered prior to discharge.         Medical Decision Making  fracture, sprain, bruising, ligamentous injury, tendon injury, muscle injury, dislocation     CXR   IM toradol , Lidoderm  patch   Incentive spirometer     PT is afebrile, non toxic appearing, no tachycardia or hypoxia     Right sided chest wall tenderness  No bruising noted  Lungs are clear  bilaterally   No flailing or crepitus of chest wall   No deformities noted    No upper or lower extremity weakness noted  No C, T, L spine tenderness       XR is neg for acute process  Plan for incentive spirometer  Ibuprofen  and flexeril   Follow up with PCP     Return precautions discussed    Amount and/or Complexity of Data Reviewed  Labs: ordered.  Radiology: ordered.    Risk  Prescription drug management.        Records Reviewed (internal and external)?: N/A  Was management discussed with a consultant?: N/A  Diagnostic test considered and not performed: N/A  Prescription medications considered and not given: N/A    Was the decision around the need for surgery discussed with a consultant?: N/A  Social Determinants of Health Considerations: N/A  Was there decision to not resuscitate or to de-escalate care due to poor prognosis?: N/A           Interpretations   O2 Sat:  The patient's oxygen saturation was 97 % on room air. This was independently interpreted by me as Normal.                 Radiology: All available radiologist's interpretations were reviewed if not separately interpreted by me.            Procedures      Procedures      Supplemental Encounter Data   Medical History[6]  Past Surgical History[7]  Social History[8]  Family History[9]  Allergies[10]    Encounter Orders:  Orders Placed This Encounter   Procedures    Ribs Right with PA Chest    Dispense Incentive Spirometer with Instructions (Spirometry)    POCT Pregnancy Test, Urine HCG    ECG 12 Lead     Medications Administered:  Medications   lidocaine  (LIDODERM ) 5 % 1 patch (1 patch Transdermal Patch Applied 12/24/23 1738)   ketorolac  (TORADOL ) injection 30 mg (30 mg Intramuscular Given 12/24/23 1738)     Laboratory and Imaging Studies:  Results for orders placed or performed during the hospital encounter of 12/24/23 (from the past 24 hours)   POCT Pregnancy Test, Urine HCG  Collection Time: 12/24/23  5:29 PM   Result Value    POCT QC Pass    POCT  Pregnancy HCG Test, UR Negative    Comment:      Negative Value is Normal in Healthy Males or Healthy non-pregnant Females     Ribs Right with PA Chest   Final Result      Normal exam.      Elsie Brought MD   12/24/2023 5:56 PM          Attestations   Ernestina Jung, PA-C         [6]   Past Medical History:  Diagnosis Date    Anemia     iron  supplement during third trimester    Angiomyolipoma of right kidney     Breast disorder 2019    benign lump left breast    Breast lump 10/27/2017    Breast lump 10/24/2017    Hyperlipidemia     Seasonal allergic rhinitis    [7]   Past Surgical History:  Procedure Laterality Date    D & E, SUCTION N/A 09/10/2016    Procedure: D & E, SUCTION;  Surgeon: Ladora Therisa Gelineau, MD;  Location:  [facility];  Service: Gynecology;  Laterality: N/A;    DILATION AND CURETTAGE OF UTERUS      INDUCED ABORTION      x2    LEEP LLETZ  05/21/2013    Procedure: LEEP LLETZ;  Surgeon: Herbie Cherry, MD;  Location: [facility];  Service: Gynecology;  Laterality: N/A;    VAGINAL DELIVERY  2000   [8]   Social History  Tobacco Use    Smoking status: Former     Types: Cigarettes    Smokeless tobacco: Former    Tobacco comments:     Occupational hygienist status: Never Used   Substance Use Topics    Alcohol use: Never    Drug use: No   [9]   Family History  Problem Relation Name Age of Onset    Breast cancer Mother  80    Cancer Mother      No known problems Father      Breast cancer Paternal Aunt  80        Paternal half-aunt    Cancer Paternal Aunt      Breast cancer Cousin  83        Paternal half-cousin; Pt reports genetic testing was negative, report not reviewed    No known problems Son      Ovarian cancer Neg Hx     [10]   Allergies  Allergen Reactions    Penicillins Other (See Comments) and Rash     Has patient had a PCN reaction causing immediate rash, facial/tongue/throat swelling, SOB or lightheadedness with hypotension: Yes  Has patient had a PCN reaction causing severe rash involving  mucus membranes or skin necrosis: No  Has patient had a PCN reaction that required hospitalization: Yes  Has patient had a PCN reaction occurring within the last 10 years: Yes  If all of the above answers are NO, then may proceed with Cephalosporin use.  Was told by parent she was allergic as child        Jung Ernestina, [place]  12/24/23 1806

## 2023-12-28 ENCOUNTER — Ambulatory Visit (INDEPENDENT_AMBULATORY_CARE_PROVIDER_SITE_OTHER)

## 2023-12-28 ENCOUNTER — Encounter (INDEPENDENT_AMBULATORY_CARE_PROVIDER_SITE_OTHER): Payer: Self-pay

## 2023-12-28 VITALS — BP 92/58 | HR 71 | Temp 98.1°F | Wt 155.4 lb

## 2023-12-28 DIAGNOSIS — S20211D Contusion of right front wall of thorax, subsequent encounter: Secondary | ICD-10-CM

## 2023-12-28 NOTE — Progress Notes (Signed)
 [facility]   OFFICE VISIT                 Date of Exam: 12/28/2023 6:13 PM        Patient ID: Judy Aguirre is a 43 y.o. female.  Attending Physician: Laymon Hoof, MD         HPI   Chief Complaint   Patient presents with    Chest wall pain     Post fall,follow up from ER.    Back Pain       History of Present Illness  Judy Aguirre is a 43 year old female who presents with MSK chest pain following a fall.    On August 24th, she experienced a fall while entering an elevator with her two young children. She slipped on a wet surface and fell forward, attempting to brace herself with her right hand while holding coffee. She did not hit her head but experienced an adrenaline rush and immediate pain.    Following the fall, she returned to her hotel room in [place] and later traveled back to [place] . She was preoccupied with caring for her sick children and managing her own allergies, which led her to take medications that may have initially masked some of her pain. However, she began experiencing sharp pains in her chest, particularly when sleeping on her stomach.    A few days post-fall, she noticed worsening pain across her ribs, prompting a visit to the ER at [facility] 12/24/2023 where she was diagnosed with a chest contusion.  ER note reviewed.  XR ribs and chest negative for pleural effusion, pneumothorax, fracture, acute finding.  She was provided with an incentive spirometer and prescribed ibuprofen , muscle relaxers, and lidocaine  patches. She uses ibuprofen  regularly but is cautious with muscle relaxers due to her responsibilities as a single mother, including caring for an autistic child.    She continues to experience sharp pain when stretching her arm or breathing deeply.  The pain has improved from the initial pain but has not resolved.  The pain persists despite the use of lidocaine  patches, which she applies intermittently. The pain is exacerbated by daily  activities and she describes the impact of the fall on a metal floor, which she notes was particularly hard and unyielding.    She is concerned about the persistence of her symptoms and the impact on her daily life, including her ability to care for her children.       ROS   Review of Systems   Musculoskeletal:  Positive for joint pain.   All other systems reviewed and are negative.  Rib contusion      Vital Signs   BP 92/58 (BP Site: Left arm, Patient Position: Sitting, Cuff Size: Medium)   Pulse 71   Temp 98.1 F (36.7 C) (Oral)   Wt 70.5 kg (155 lb 6.4 oz)   SpO2 99%   BMI 24.34 kg/m      Physical Exam   Physical Exam  Vitals and nursing note reviewed.   Constitutional:       General: She is not in acute distress.     Appearance: Normal appearance. She is not ill-appearing, toxic-appearing or diaphoretic.   HENT:      Head: Normocephalic and atraumatic.      Right Ear: External ear normal.      Left Ear: External ear normal.      Nose: Nose normal. No congestion or rhinorrhea.   Eyes:  General: No scleral icterus.        Right eye: No discharge.         Left eye: No discharge.      Pupils: Pupils are equal, round, and reactive to light.   Musculoskeletal:         General: No swelling, tenderness, deformity or signs of injury.      Cervical back: Neck supple.   Skin:     General: Skin is warm and dry.      Coloration: Skin is not jaundiced or pale.      Findings: No bruising, erythema, lesion or rash.   Neurological:      General: No focal deficit present.      Mental Status: She is alert and oriented to person, place, and time.      Gait: Gait normal.   Psychiatric:         Mood and Affect: Mood normal.         Behavior: Behavior normal.       Physical Exam           Results  RADIOLOGY  Chest X-ray: No fracture (12/13/2023)         Problem List:    Problem List[1]      Assessment/Plan   1. Rib contusion, right, subsequent encounter  Normal healing from rib contusion  Continue lidocaine  patches,  ibuprofen  and muscle relaxants as needed  Activity, massage, stretching as tolerated; heat therapy as tolerated      Counseled patient to call office with any questions, with worsening symptoms or lack of improvement in symptoms.  Counseled patient to present to ED if there is concern for emergency.    I recommend the patient schedule a physical exam annually. If they have had a physical exam within the last year, I recommend scheduling a physical from the one year mark.    This document was prepared using speech dictation software and may contain grammatical and spelling errors, homophonic word substitutions or errors and not the intention of the author.            [1]   Patient Active Problem List  Diagnosis    High grade squamous intraepithelial cervical dysplasia    History of preterm delivery    HSV-2 seropositive    Elderly multigravida, third trimester    Cystic hygroma    Breast lump    Abnormal findings on diagnostic imaging of breast    Family history of malignant neoplasm of breast    Vaginal discharge during pregnancy in third trimester    Encounter for screening for malignant neoplasm of breast, unspecified screening modality    Unintentional weight loss    Enlarged thyroid     Mass of joint of right shoulder

## 2023-12-28 NOTE — Progress Notes (Signed)
Have you seen any specialists since your last visit with us?  No      The patient was informed that the following HM items are still outstanding:   pap smear and influenza vaccine

## 2024-02-12 NOTE — Progress Notes (Unsigned)
 [facility]   OFFICE VISIT          {  Disappearing Text  Click a link below to be taken to that activity or part of the chart   Chart Review  Order Review  Review Flowsheets  Labs  Health Maintenance  Immunizations  Allergies  Medications  Problem List  History  Synopsis   :[id number]}      HPI   No chief complaint on file.     HPI  ROS   Review of Systems    Vital Signs   There were no vitals taken for this visit.  Physical Exam   Physical Exam   Assessment/Plan     Assessment & Plan

## 2024-02-13 ENCOUNTER — Ambulatory Visit (INDEPENDENT_AMBULATORY_CARE_PROVIDER_SITE_OTHER): Admitting: Family Medicine

## 2024-02-13 ENCOUNTER — Other Ambulatory Visit (INDEPENDENT_AMBULATORY_CARE_PROVIDER_SITE_OTHER): Payer: Self-pay | Admitting: Family Medicine

## 2024-02-13 ENCOUNTER — Encounter (INDEPENDENT_AMBULATORY_CARE_PROVIDER_SITE_OTHER): Payer: Self-pay | Admitting: Family Medicine

## 2024-02-13 VITALS — BP 104/68 | HR 62 | Temp 98.2°F | Wt 154.0 lb

## 2024-02-13 DIAGNOSIS — N83201 Unspecified ovarian cyst, right side: Secondary | ICD-10-CM

## 2024-02-13 DIAGNOSIS — R109 Unspecified abdominal pain: Secondary | ICD-10-CM

## 2024-02-13 DIAGNOSIS — Z9889 Other specified postprocedural states: Secondary | ICD-10-CM

## 2024-02-13 DIAGNOSIS — E041 Nontoxic single thyroid nodule: Secondary | ICD-10-CM

## 2024-02-13 DIAGNOSIS — Z23 Encounter for immunization: Secondary | ICD-10-CM

## 2024-02-13 DIAGNOSIS — M79671 Pain in right foot: Secondary | ICD-10-CM

## 2024-02-14 ENCOUNTER — Other Ambulatory Visit (INDEPENDENT_AMBULATORY_CARE_PROVIDER_SITE_OTHER): Payer: Self-pay | Admitting: Family Medicine

## 2024-02-14 DIAGNOSIS — E041 Nontoxic single thyroid nodule: Secondary | ICD-10-CM

## 2024-02-14 DIAGNOSIS — R109 Unspecified abdominal pain: Secondary | ICD-10-CM

## 2024-02-17 ENCOUNTER — Ambulatory Visit

## 2024-02-17 ENCOUNTER — Ambulatory Visit
Admission: RE | Admit: 2024-02-17 | Discharge: 2024-02-17 | Disposition: A | Discharge status: Home / Self Care | Source: Ambulatory Visit | Attending: Family Medicine | Admitting: Family Medicine

## 2024-02-17 DIAGNOSIS — E041 Nontoxic single thyroid nodule: Secondary | ICD-10-CM | POA: Insufficient documentation

## 2024-02-20 ENCOUNTER — Ambulatory Visit
Admission: RE | Admit: 2024-02-20 | Discharge: 2024-02-20 | Disposition: A | Discharge status: Home / Self Care | Source: Ambulatory Visit | Attending: Family Medicine | Admitting: Family Medicine

## 2024-02-20 ENCOUNTER — Ambulatory Visit (INDEPENDENT_AMBULATORY_CARE_PROVIDER_SITE_OTHER): Payer: Self-pay | Admitting: Family Medicine

## 2024-02-20 DIAGNOSIS — R109 Unspecified abdominal pain: Secondary | ICD-10-CM | POA: Insufficient documentation

## 2024-02-22 ENCOUNTER — Encounter (INDEPENDENT_AMBULATORY_CARE_PROVIDER_SITE_OTHER): Admitting: Physician Assistant

## 2024-02-22 ENCOUNTER — Encounter (INDEPENDENT_AMBULATORY_CARE_PROVIDER_SITE_OTHER): Payer: Self-pay | Admitting: Physician Assistant

## 2024-02-22 DIAGNOSIS — R109 Unspecified abdominal pain: Secondary | ICD-10-CM

## 2024-02-22 NOTE — Progress Notes (Signed)
A user error has taken place: encounter opened in error, closed for administrative reasons.  This encounter was created in error - please disregard.    Items noted as "reviewed" are for administrative purposes only and are not guaranteed by the provider to be accurate on this date.

## 2024-02-28 LAB — CBC
Hematocrit: 42.9 % (ref 34.0–46.6)
Hemoglobin: 14.3 g/dL (ref 11.1–15.9)
MCH: 30 pg (ref 26.6–33.0)
MCHC: 33.3 g/dL (ref 31.5–35.7)
MCV: 90 fL (ref 79–97)
Platelets: 310 x10E3/uL (ref 150–450)
RBC: 4.77 x10E6/uL (ref 3.77–5.28)
RDW: 12 % (ref 11.7–15.4)
WBC: 7.5 x10E3/uL (ref 3.4–10.8)

## 2024-02-28 LAB — COMPREHENSIVE METABOLIC PANEL
ALT: 14 IU/L (ref 0–32)
AST (SGOT): 14 IU/L (ref 0–40)
Albumin: 4.3 g/dL (ref 3.9–4.9)
Alkaline Phosphatase: 52 IU/L (ref 41–116)
BUN / Creatinine Ratio: 11 (ref 9–23)
BUN: 9 mg/dL (ref 6–24)
Bilirubin, Total: 0.5 mg/dL (ref 0.0–1.2)
CO2: 23 mmol/L (ref 20–29)
Calcium: 9 mg/dL (ref 8.7–10.2)
Chloride: 102 mmol/L (ref 96–106)
Creatinine: 0.79 mg/dL (ref 0.57–1.00)
Globulin, Total: 2.9 g/dL (ref 1.5–4.5)
Glucose: 101 mg/dL — ABNORMAL HIGH (ref 70–99)
Potassium: 4.1 mmol/L (ref 3.5–5.2)
Protein, Total: 7.2 g/dL (ref 6.0–8.5)
Sodium: 140 mmol/L (ref 134–144)
eGFR: 96 mL/min/1.73 (ref >59)

## 2024-03-10 ENCOUNTER — Emergency Department

## 2024-03-10 ENCOUNTER — Emergency Department
Admission: EM | Admit: 2024-03-10 | Discharge: 2024-03-10 | Disposition: A | Discharge status: Home / Self Care | Attending: Emergency Medicine | Admitting: Emergency Medicine

## 2024-03-10 DIAGNOSIS — J329 Chronic sinusitis, unspecified: Secondary | ICD-10-CM

## 2024-03-10 DIAGNOSIS — J324 Chronic pansinusitis: Secondary | ICD-10-CM | POA: Insufficient documentation

## 2024-03-10 DIAGNOSIS — R110 Nausea: Secondary | ICD-10-CM | POA: Insufficient documentation

## 2024-03-10 DIAGNOSIS — J45909 Unspecified asthma, uncomplicated: Secondary | ICD-10-CM | POA: Insufficient documentation

## 2024-03-10 DIAGNOSIS — R109 Unspecified abdominal pain: Secondary | ICD-10-CM | POA: Insufficient documentation

## 2024-03-10 DIAGNOSIS — R519 Headache, unspecified: Secondary | ICD-10-CM

## 2024-03-10 DIAGNOSIS — Z87891 Personal history of nicotine dependence: Secondary | ICD-10-CM | POA: Insufficient documentation

## 2024-03-10 LAB — LAB USE ONLY - CBC WITH DIFFERENTIAL
Absolute Basophils: 0.05 x10 3/uL (ref 0.00–0.08)
Absolute Eosinophils: 0.22 x10 3/uL (ref 0.00–0.44)
Absolute Immature Granulocytes: 0.04 x10 3/uL (ref 0.00–0.07)
Absolute Lymphocytes: 2 x10 3/uL (ref 0.42–3.22)
Absolute Monocytes: 0.59 x10 3/uL (ref 0.21–0.85)
Absolute Neutrophils: 7.08 x10 3/uL — ABNORMAL HIGH (ref 1.10–6.33)
Absolute nRBC: 0 x10 3/uL (ref <=0.00)
Basophils %: 0.5 %
Eosinophils %: 2.2 %
Hematocrit: 40.8 % (ref 34.7–43.7)
Hemoglobin: 13.4 g/dL (ref 11.4–14.8)
Immature Granulocytes %: 0.4 %
Lymphocytes %: 20 %
MCH: 29.3 pg (ref 25.1–33.5)
MCHC: 32.8 g/dL (ref 31.5–35.8)
MCV: 89.3 fL (ref 78.0–96.0)
MPV: 8.7 fL — ABNORMAL LOW (ref 8.9–12.5)
Monocytes %: 5.9 %
Neutrophils %: 71 %
Platelet Count: 358 x10 3/uL — ABNORMAL HIGH (ref 142–346)
Preliminary Absolute Neutrophil Count: 7.08 x10 3/uL — ABNORMAL HIGH (ref 1.10–6.33)
RBC: 4.57 x10 6/uL (ref 3.90–5.10)
RDW: 13 % (ref 11–15)
WBC: 9.98 x10 3/uL — ABNORMAL HIGH (ref 3.10–9.50)
nRBC %: 0 /100{WBCs} (ref <=0.0)

## 2024-03-10 LAB — COMPREHENSIVE METABOLIC PANEL
ALT: 19 U/L (ref <=55)
AST (SGOT): 19 U/L (ref <=41)
Albumin/Globulin Ratio: 1.1 (ref 0.9–2.2)
Albumin: 4 g/dL (ref 3.5–4.9)
Alkaline Phosphatase: 62 U/L (ref 37–117)
Anion Gap: 9 (ref 5.0–15.0)
BUN: 11 mg/dL (ref 7–21)
Bilirubin, Total: 0.3 mg/dL (ref 0.2–1.2)
CO2: 24 meq/L (ref 17–29)
Calcium: 8.9 mg/dL (ref 8.5–10.5)
Chloride: 103 meq/L (ref 99–111)
Creatinine: 0.7 mg/dL (ref 0.4–1.0)
GFR: >60 mL/min/1.73 m2 (ref >=60.0)
Globulin: 3.6 g/dL (ref 2.0–3.6)
Glucose: 105 mg/dL — ABNORMAL HIGH (ref 70–100)
Potassium: 3.9 meq/L (ref 3.5–5.3)
Protein, Total: 7.6 g/dL (ref 6.0–8.3)
Sodium: 136 meq/L (ref 135–145)

## 2024-03-10 LAB — COVID-19 (SARS-COV-2) & INFLUENZA  A/B, NAA (ROCHE LIAT)
Influenza A RNA: NOT DETECTED
Influenza B RNA: NOT DETECTED
SARS-CoV-2 (COVID-19) RNA: NOT DETECTED

## 2024-03-10 LAB — BETA HCG QUANTITATIVE, PREGNANCY: hCG, Quantitative: <2.4 m[IU]/mL

## 2024-03-10 LAB — MONONUCLEOSIS SCREEN: Mononucleosis Screen: NEGATIVE

## 2024-03-10 LAB — THYROID STIMULATING HORMONE (TSH) WITH REFLEX TO FREE T4: TSH: 0.82 u[IU]/mL (ref 0.35–4.94)

## 2024-03-10 MED ORDER — KETOROLAC TROMETHAMINE 30 MG/ML IJ SOLN
30.0000 mg | Freq: Once | INTRAMUSCULAR | Status: AC
Start: 2024-03-10 — End: 2024-03-10
  Administered 2024-03-10: 30 mg via INTRAVENOUS
  Filled 2024-03-10: qty 1

## 2024-03-10 MED ORDER — BUTALBITAL-APAP-CAFFEINE 50-325-40 MG PO TABS
1.0000 | ORAL_TABLET | ORAL | 0 refills | Status: AC | PRN
Start: 2024-03-10 — End: ?

## 2024-03-10 MED ORDER — DIPHENHYDRAMINE HCL 50 MG/ML IJ SOLN
12.5000 mg | Freq: Once | INTRAMUSCULAR | Status: AC
Start: 2024-03-10 — End: 2024-03-10
  Administered 2024-03-10: 12.5 mg via INTRAVENOUS
  Filled 2024-03-10: qty 1

## 2024-03-10 MED ORDER — BUTALBITAL-APAP-CAFFEINE 50-325-40 MG PO TABS
1.0000 | ORAL_TABLET | Freq: Once | ORAL | Status: AC
Start: 2024-03-10 — End: 2024-03-10
  Administered 2024-03-10: 1 via ORAL
  Filled 2024-03-10: qty 1

## 2024-03-10 MED ORDER — DEXAMETHASONE SODIUM PHOSPHATE 4 MG/ML IJ SOLN (WRAP)
10.0000 mg | Freq: Once | INTRAMUSCULAR | Status: AC
Start: 2024-03-10 — End: 2024-03-10
  Administered 2024-03-10: 10 mg via INTRAVENOUS
  Filled 2024-03-10: qty 3

## 2024-03-10 MED ORDER — METOCLOPRAMIDE HCL 5 MG/ML IJ SOLN
10.0000 mg | Freq: Once | INTRAMUSCULAR | Status: AC
Start: 2024-03-10 — End: 2024-03-10
  Administered 2024-03-10: 10 mg via INTRAVENOUS
  Filled 2024-03-10: qty 2

## 2024-03-10 MED ORDER — LEVOFLOXACIN 750 MG PO TABS
750.0000 mg | ORAL_TABLET | Freq: Once | ORAL | Status: AC
Start: 2024-03-10 — End: 2024-03-10
  Administered 2024-03-10: 750 mg via ORAL
  Filled 2024-03-10: qty 1

## 2024-03-10 MED ORDER — LEVOFLOXACIN 750 MG PO TABS
750.0000 mg | ORAL_TABLET | Freq: Every day | ORAL | 0 refills | Status: AC
Start: 2024-03-10 — End: 2024-03-15

## 2024-03-10 MED ORDER — SODIUM CHLORIDE 0.9 % IV BOLUS
1000.0000 mL | Freq: Once | INTRAVENOUS | Status: AC
Start: 2024-03-10 — End: 2024-03-10
  Administered 2024-03-10: 1000 mL via INTRAVENOUS
  Filled 2024-03-10: qty 1000

## 2024-03-10 NOTE — Discharge Instructions (Addendum)
 Dear Ms. Estelle:    Thank you for choosing the [facility] Emergency Department, the premier emergency department in the [place] area.  I hope your visit today was EXCELLENT. You will receive a survey via text message that will give you the opportunity to provide feedback to your team about your visit. Please do not hesitate to reach out with any questions!    Specific instructions for your visit today:    Fleurette entire course of antibiotics.  Take Fioricet  for headache as needed.    IF YOU DO NOT CONTINUE TO IMPROVE OR YOUR CONDITION WORSENS, PLEASE CONTACT YOUR DOCTOR OR RETURN IMMEDIATELY TO THE EMERGENCY DEPARTMENT.    Sincerely,  Aslani-Amoli, Erskin, MD  Attending Emergency Physician  [facility] Emergency Department      OBTAINING A PRIMARY CARE APPOINTMENT    Primary care physicians (PCPs, also known as primary care doctors) are either internists or family medicine doctors. Both types of PCPs focus on health promotion, disease prevention, patient education and counseling, and treatment of acute and chronic medical conditions.    If you need a primary care doctor, please review the information below to find a primary care location best for you:.     [facility]  Visit below website for list of all [facility] Locations  [email or web address]    DOCTOR REFERRALS  Call [phone] (available 24 hours a day, 7 days a week) if you need any further referrals and we can help you find a primary care doctor or specialist.  Referral services are also available online at:  [email or web address] Please reach out to our ED Care Management Specialist, Kacie Oneal, if you encounter any difficulty in scheduling your [facility] follow up appointment.  Kacie Oneal can be reached at [phone] or [email or web address].    YOUR CONTACT INFORMATION  Before leaving please check with registration to make sure we have an up-to-date  contact number.  You can call registration at [phone] to update your information.  For questions about your hospital bill, please call [phone].  For questions about your Emergency Dept Physician bill please call [phone].      FREE HEALTH SERVICES  If you need help with health or social services, please call 2-1-1 for a free referral to resources in your area.  2-1-1 is a free service connecting people with information on health insurance, free clinics, pregnancy, mental health, dental care, food assistance, housing, and substance abuse counseling.  Also, available online at:  http://www.[facility].org    ORTHOPEDIC INJURY   Please know that significant injuries can exist even when an initial x-ray is read as normal or negative.  This can occur because some fractures (broken bones) are not initially visible on x-rays.  For this reason, close outpatient follow-up with your primary care doctor or bone specialist (orthopedist) is required.    MEDICATIONS AND FOLLOWUP  Please be aware that some prescription medications can cause drowsiness.  Use caution when driving or operating machinery.    The examination and treatment you have received in our Emergency Department is provided on an emergency basis, and is not intended to be a substitute for your primary care physician.  It is important that your doctor checks you again and that you report any new or remaining problems at that time.      ASSISTANCE WITH INSURANCE    Affordable Care Act  [facility])  Call to start or finish an application, compare plans, enroll or ask a question.  [phone]  TTY: 8-144-110-5674  Web:  Healthcare.gov    Help Enrolling in [facility]  Cover [place]   208-531-6338 (TOLL-FREE)  7031280620 (TTY)  Web:  Http://www.coverva.org    Local Help Enrolling in the [employer]CA  [place]  Family Service  [phone] (MAIN)  Email:  health-help@nvfs .org  Web:  Blackjackmyths.is  Address:  [address], [address]  [place], [place] [address]    SEDATING MEDICATIONS  Sedating medications include strong pain medications (e.g. narcotics), muscle relaxers, benzodiazepines (used for anxiety and as muscle relaxers), Benadryl /diphenhydramine  and other antihistamines for allergic reactions/itching, and other medications.  If you are unsure if you have received a sedating medication, please ask your physician or nurse.  If you received a sedating medication: DO NOT drive a car. DO NOT operate machinery. DO NOT perform jobs where you need to be alert.  DO NOT drink alcoholic beverages while taking this medicine.     If you get dizzy, sit or lie down at the first signs. Be careful going up and down stairs.  Be extra careful to prevent falls.     Never give this medicine to others.     Keep this medicine out of reach of children.     Do not take or save old medicines. Throw them away when outdated.     Keep all medicines in a cool, dry place. DO NOT keep them in your bathroom medicine cabinet or in a cabinet above the stove.    MEDICATION REFILLS  Please be aware that we cannot refill any prescriptions through the ER. If you need further treatment from what is provided at your ER visit, please follow up with your primary care doctor or your pain management specialist.    FREESTANDING EMERGENCY DEPARTMENTS OF [facility]  Did you know [facility] has two freestanding ERs located just a few miles away?  [facility] [place] and [facility] [place] have short wait times, easy free parking directly in front of the building and top patient satisfaction scores - and the same Board Certified Emergency Medicine doctors as [facility].

## 2024-03-10 NOTE — ED Provider Notes (Signed)
 [facility]  Emergency Department Physician Note      Diagnosis/Disposition     ED Disposition:  Discharge    ED Diagnosis:     Nonintractable headache, unspecified chronicity pattern, unspecified headache type  Sinusitis, unspecified chronicity, unspecified location    Discharge Medication List as of 03/10/2024  7:46 PM        START taking these medications    Details   butalbital -acetaminophen -caffeine  (FIORICET ) 50-325-40 MG per tablet Take 1 tablet by mouth every 4 (four) hours as needed for Headaches, Starting Sat 03/10/2024, E-Rx      levoFLOXacin  (LEVAQUIN ) 750 MG tablet Take 1 tablet (750 mg) by mouth once daily for 5 days, Starting Sat 03/10/2024, Until Thu 03/15/2024, E-Rx               History of Present Illness      Nursing Triage Note: Arrives ambulatory for c/o throbbing HA, fatigue, abdominal pain and nausea for over a month. Pt states that she went to PCP and was told that I am prediabetic and I have a tumor on my kidney.  Hx of asthma.  Pt reports significant weight loss recently.  Denies fever, diarrhea, constipation, dysuria, urinary frequency/urgency.  Pt received first flu vaccine a month ago.  +dizziness and intermittent hematuria.  Chief Complaint: Headache    History of Present Illness  Judy Aguirre is a 43 year old female with asthma who presents with headache and overall fatigue.    Over the past year and a half, she has experienced a decline in health, including weight loss. Four weeks ago, she developed congestion and watery eyes without fever, which she initially attributed to a cold. Three weeks ago, she received a flu shot, but her symptoms persisted. She now experiences lightheadedness, especially when driving or standing, with a sensation of blood rushing through her head and chest, leading to disorientation and a feeling of faintness.  She has not been started on any antibiotics recently.    She experiences breathing difficulties, particularly when  lying down, similar to RSV symptoms in children. She occasionally coughs, which she attributes to asthma. Probiotic pills have helped alleviate some throat discomfort. She experiences abdominal pain when eating, described as discomfort rather than nausea, and often feels worse after eating. She has two small children and finds it challenging to care for them due to her symptoms.      Physical Exam     Pulse (!) 117  BP 128/75  Resp 20  SpO2 96 %  Temp 98.5 F (36.9 C)  Physical Exam  CONSTITUTIONAL: Vitals and nursing note reviewed. No acute distress. Well developed.  HEENT: Normocephalic and atraumatic. External ears normal. Nose normal. Normal conjunctiva. Pupils are equal, round and reactive to light.  Nasal congestion.  CARDIOVASCULAR: Normal rate and rhythm. Normal heart sounds. No murmur.  RESPIRATORY: Normal breath sounds. Normal effort.  ABDOMINAL: No abdominal tenderness. No rebound. No guarding. No pulsatile mass.  MUSCULOSKELETAL: No musculoskeletal tenderness. Normal range of motion.  NEUROLOGICAL: Alert and oriented. Normal strength. Normal sensation.    NIH Stroke Scale: 0 points   INPUTS:   1A: Level of consciousness -> 0 = Alert; keenly responsive   1B: Ask month and age -> 0 = Both questions right   1C: 'Blink eyes' & 'squeeze hands' -> 0 = Performs both tasks   2: Horizontal extraocular movements -> 0 = Normal   3: Visual fields -> 0 = No visual loss   4: Facial  palsy -> 0 = Normal symmetry   5A: Left arm motor drift -> 0 = No drift for 10 seconds   5B: Right arm motor drift -> 0 = No drift for 10 seconds   6A: Left leg motor drift -> 0 = No drift for 5 seconds   6B: Right leg motor drift -> 0 = No drift for 5 seconds   7: Limb Ataxia -> 0 = No ataxia   8: Sensation -> 0 = Normal; no sensory loss   9: Language/aphasia -> 0 = Normal; no aphasia   10: Dysarthria -> 0 = Normal   11: Extinction/inattention -> 0 = No abnormality        Medical Decision Making          Initial Differential  Diagnosis:  Initial differential diagnosis to include but not limited to: COVID, flu, viral syndrome, mono, anemia, AKI, thyroid  disorder    Medical Decision Making:  Medical Decision Making  43 year old female with a history of asthma, benign kidney angiolipoma, thyroid  cyst, and prediabetes presented with several weeks of worsening fatigue, lightheadedness, disorientation, new headaches, chest discomfort with dyspnea, and abdominal pain with eating.  On exam she has nasal congestion, but otherwise neuro intact.    - Blood work ordered including CBC, CMP all normal  - Monospot negative  - Negative COVID/flu  - Thyroid  screen WNL  - CT head shows pansinusitis  - Patient given migraine cocktail of IV fluids/IV Benadryl /IV Reglan /IV Toradol /IV Decadron , she has significant improvement in her headache symptoms especially after Decadron .  Followed with p.o. Fioricet   - Will treat her sinusitis which is likely part of her overall feeling of illness, given first dose of p.o. Levaquin  in the ED as patient has severe penicillin allergy will discharge with home antibiotics  - Patient in agreement with discharge plan. Return precautions reviewed.        Initial Differential Diagnosis:  Records Reviewed (internal and external)? : N/A  Was management discussed with a consultant? : N/A  Diagnostic test considered and not performed : N/A  Prescription medications considered and not given : N/A  Was the decision around the need for surgery discussed with a consultant? : N/A  Social Determinants of Health Considerations : N/A  Was there decision to not resuscitate or to de-escalate care due to poor prognosis? : N/A         Interpretations   O2 Sat:  The patient's oxygen saturation was 96 % on room air. This was independently interpreted by me as Normal.     Cardiac Monitoring: I independely reviewed and interpreted the patient's rhythm strip as normal sinus at 71.        Procedures      Procedures      Supplemental Encounter Data    Medical History[7]  Past Surgical History[8]  Social History[9]  Family History[10]  Allergies[11]    Medications Administered:  Medications   sodium chloride  0.9 % bolus 1,000 mL (0 mLs Intravenous Stopped 03/10/24 1906)   ketorolac  (TORADOL ) injection 30 mg (30 mg Intravenous Given 03/10/24 1729)   metoclopramide  (REGLAN ) injection 10 mg (10 mg Intravenous Given 03/10/24 1729)   diphenhydrAMINE  (BENADRYL ) injection 12.5 mg (12.5 mg Intravenous Given 03/10/24 1729)   dexAMETHasone  (DECADRON ) injection 10 mg (10 mg Intravenous Given 03/10/24 1846)   butalbital -acetaminophen -caffeine  (FIORICET ) 50-325-40 MG per tablet 1 tablet (1 tablet Oral Given 03/10/24 1950)   levoFLOXacin  (LEVAQUIN ) tablet 750 mg (750 mg Oral Given 03/10/24 1950)  Laboratory and Imaging Studies:  Results for orders placed or performed during the hospital encounter of 03/10/24 (from the past 24 hours)   Comprehensive Metabolic Panel    Collection Time: 03/10/24  4:55 PM   Result Value    Glucose 105 (H)    BUN 11    Creatinine 0.7    Sodium 136    Potassium 3.9    Chloride 103    CO2 24    Calcium 8.9    Anion Gap 9.0    GFR >60.0    AST (SGOT) 19    ALT 19    Alkaline Phosphatase 62    Albumin 4.0    Protein, Total 7.6    Globulin 3.6    Albumin/Globulin Ratio 1.1    Bilirubin, Total 0.3   Beta HCG Quantitative, Pregnancy    Collection Time: 03/10/24  4:55 PM   Result Value    hCG, Quantitative <2.4   CBC with Differential (Component)    Collection Time: 03/10/24  4:55 PM   Result Value    WBC 9.98 (H)    Hemoglobin 13.4    Hematocrit 40.8    Platelet Count 358 (H)    MPV 8.7 (L)    RBC 4.57    MCV 89.3    MCH 29.3    MCHC 32.8    RDW 13    nRBC % 0.0    Absolute nRBC 0.00    Preliminary Absolute Neutrophil Count 7.08 (H)    Neutrophils % 71.0    Lymphocytes % 20.0    Monocytes % 5.9    Eosinophils % 2.2    Basophils % 0.5    Immature Granulocytes % 0.4    Absolute Neutrophils 7.08 (H)    Absolute Lymphocytes 2.00    Absolute Monocytes  0.59    Absolute Eosinophils 0.22    Absolute Basophils 0.05    Absolute Immature Granulocytes 0.04   Thyroid  Stimulating Hormone (TSH) with Reflex to Free T4    Collection Time: 03/10/24  4:55 PM   Result Value    TSH 0.82   COVID-19 and Influenza ([place]) (symptomatic)    Collection Time: 03/10/24  6:15 PM    Specimen: Anterior Nares; Swab   Result Value    SARS-CoV-2 (COVID-19) RNA Not Detected    Influenza A RNA Not Detected    Influenza B RNA Not Detected   Mononucleosis Screen    Collection Time: 03/10/24  6:15 PM   Result Value    Mononucleosis Screen Negative     CT Head WO Contrast   Final Result          1.No CT evidence of acute intracranial abnormality.   2.Pansinusitis.      Sherlean IVAR Flank, MD   03/10/2024 6:41 PM          Attestations   I am the primary attending physician of record for this patient.          [7]   Past Medical History:  Diagnosis Date    Anemia     iron  supplement during third trimester    Angiomyolipoma of right kidney     Breast disorder 2019    benign lump left breast    Breast lump 10/27/2017    Breast lump 10/24/2017    Hyperlipidemia     Seasonal allergic rhinitis    [8]   Past Surgical History:  Procedure Laterality Date    D & E, SUCTION N/A 09/10/2016  Procedure: D & E, SUCTION;  Surgeon: Ladora Therisa Gelineau, MD;  Location: [facility];  Service: Gynecology;  Laterality: N/A;    DILATION AND CURETTAGE OF UTERUS      INDUCED ABORTION      x2    LEEP LLETZ  05/21/2013    Procedure: LEEP LLETZ;  Surgeon: Herbie Cherry, MD;  Location: [facility];  Service: Gynecology;  Laterality: N/A;    VAGINAL DELIVERY  2000   [9]   Social History  Tobacco Use    Smoking status: Former     Types: Cigarettes    Smokeless tobacco: Former    Tobacco comments:     Occupational Hygienist status: Never Used   Substance Use Topics    Alcohol use: Never    Drug use: No   [10]   Family History  Problem Relation Name Age of Onset    Breast cancer Mother  70    Cancer Mother      No  known problems Father      Breast cancer Paternal Aunt  84        Paternal half-aunt    Cancer Paternal Aunt      Breast cancer Cousin  3        Paternal half-cousin; Pt reports genetic testing was negative, report not reviewed    No known problems Son      Ovarian cancer Neg Hx     [11]   Allergies  Allergen Reactions    Penicillins Other (See Comments) and Rash     Has patient had a PCN reaction causing immediate rash, facial/tongue/throat swelling, SOB or lightheadedness with hypotension: Yes  Has patient had a PCN reaction causing severe rash involving mucus membranes or skin necrosis: No  Has patient had a PCN reaction that required hospitalization: Yes  Has patient had a PCN reaction occurring within the last 10 years: Yes  If all of the above answers are NO, then may proceed with Cephalosporin use.  Was told by parent she was allergic as child        Royetta Ruby, MD  03/10/24 2027

## 2024-03-10 NOTE — ED Triage Notes (Signed)
 Pt c/o Head/sinus pressure, feeling fatigued, disoriented when driving, SOB at rest and exertion x4 weeks - stating that it is getting worse w/ everyday. C/o nausea with eating, denies any n/v at this time. Denies fever. Has been experiencing weight loss w/ a recent diagnosis of angiomyolipoma of r kidney.   States Feels like my body is shutting down.   Denies vision changes. + dizziness.

## 2024-03-10 NOTE — ED Notes (Signed)
 I have assisted in the ordering of diagnostic studies necessary to expedite care in triage and initial testing was ordered based on presenting complaint. I am not the primary physician for this patient.       Star Salen, MD  03/10/24 [phone]

## 2024-03-20 ENCOUNTER — Telehealth (INDEPENDENT_AMBULATORY_CARE_PROVIDER_SITE_OTHER): Payer: Self-pay

## 2024-03-20 ENCOUNTER — Ambulatory Visit (INDEPENDENT_AMBULATORY_CARE_PROVIDER_SITE_OTHER)

## 2024-03-20 ENCOUNTER — Encounter (INDEPENDENT_AMBULATORY_CARE_PROVIDER_SITE_OTHER): Payer: Self-pay

## 2024-03-20 VITALS — BP 100/64 | HR 64 | Temp 98.2°F | Ht 64.0 in | Wt 157.0 lb

## 2024-03-20 DIAGNOSIS — R140 Abdominal distension (gaseous): Secondary | ICD-10-CM | POA: Insufficient documentation

## 2024-03-20 DIAGNOSIS — K5900 Constipation, unspecified: Secondary | ICD-10-CM

## 2024-03-20 DIAGNOSIS — R1319 Other dysphagia: Secondary | ICD-10-CM | POA: Insufficient documentation

## 2024-03-20 DIAGNOSIS — R634 Abnormal weight loss: Secondary | ICD-10-CM

## 2024-03-20 DIAGNOSIS — R1013 Epigastric pain: Secondary | ICD-10-CM | POA: Insufficient documentation

## 2024-03-20 MED ORDER — PANTOPRAZOLE SODIUM 40 MG PO TBEC: 40.0000 mg | DELAYED_RELEASE_TABLET | Freq: Every day | ORAL | 2 refills | Status: AC

## 2024-03-20 MED ORDER — PEG 3350-KCL-NABCB-NACL-NASULF 236 G PO SOLR: 4.0000 L | Freq: Once | ORAL | 0 refills | Status: AC

## 2024-03-20 NOTE — Patient Instructions (Addendum)
 It was a pleasure taking care of you today!   Please call clinic or message me via MyChart with any questions or concerns.     Obtain h pylori breath test  prior to starting Pantoprazole.  Take pantoprazole 40 mg daily 30-60 minutes before breakfast.    Avoid fatty, fried, or spicy foods, tomatoes, carbonated beverages, chocolate, chili powder and pepper, onions, cheese, garlic, mint, citrus fruits, processed snacks, and caffeine ; Avoid alcohol, tobacco, NSAIDs. Eat multiple small meals instead of large meals. Avoid eating 3-4 hours prior to sleep. Try a wedge pillow to elevate head of bed.    Upper Endoscopy and Colonoscopy (see instructions below)  using GOLYTELY with Dr. Carvel at [facility].  For your constipation you can start with a soluble fiber supplement such as Citrucel, Benefiber, Metamucil, or Fibercon. Please start slowly and ensure you are drinking enough fluids. Slowly add a small amount until you are consistently taking 2 scoops of fiber a day.   If you continue to have constipation you can add some Miralax. Again start slowly and adjust the Miralax until you are having a daily soft easy to pass bowel movement, up to 2 scoops daily. If you have diarrhea, reduce the amount of Miralax you are taking.      If between this office visit and scheduled endoscopic procedures you have any of the following: new medical conditions or symptoms, changes in existing medical conditions, emergency room visits, or hospital admissions, please message GI provider through my chart or call GI clinic at [phone] immediately.   Colonoscopy Preparation Instructions - Colyte/Gavilyte/Golytely    Please alert us  if you are taking:    Some of these medications may need to be held in advance of your procedure.    Anticoagulant blood thinners: Eliquis, Pradaxa, Xarelto, warfarin, or others  Antiplatelet blood thinners: Plavix, Brilinta, Effient, or others  Diabetes medications: Insulin, Jardiance, Januvia, Farxiga, Invokana, or  others   Some of these medications are also used to treat heart and kidney conditions.   GLP1 agonists (Weight loss/Diabetes medications): Ozempic, Wegovy, Trulicity, Mounjaro, or others  Not following these instructions may result in cancellation    Medications:  If instructed, contact your prescribing physician at least 2 weeks before your procedure regarding the continuation or cessation of blood thinners (excluding aspirin).  Don't delay this discussion, as some medications may need to be stopped days in advance.  Discuss dosage adjustments with your prescribing physician to prevent low blood sugar.  You can take your other regular medications up until 4 hours before your scheduled procedure time.  Aspirin, NSAIDs, and fish oil can be continued.    General Endoscopy Information  A responsible adult must accompany you home after the procedure, or risk procedure cancellation.  Do not drive after the procedure; arrange transportation with a responsible adult to accompany you if using taxis or ride-sharing services.  Bring insurance referral (if required), insurance card(s), copay, and valid photo ID.  Arrive 1 hour before your timed procedure.   Our office will verify coverage, but you will be responsible for deductibles and copays; insurance classification may affect expenses.  Contact your insurance provider directly for coverage queries.  Reach out to your GI physician's office during business hours for any questions or concerns.    Important Bowel Preparation Instructions:  Strictly adhere to the prep instructions outlined in this document and not on packaging. It is essential to follow instructions completely for optimal bowel cleansing - this is essential for effective  screening and polyp detection.                                                                                                             One (1) day before the procedure: Split Dose - 1st Half   Drink only clear liquids throughout the entire  day - No solid food should be consumed until after the  completion of your procedure.     CLEAR LIQUID DIET   THESE ITEMS ARE ALLOWED:  Water   Clear broth: beef, chicken, vegetable  No chunks of meat within  Juices  Apple juice or cider  White grape juice, white cranberry juice  Tang  Pulp-free Lemonade  Soda (clear color) or Ginger Ale  Tea  Coffee (without milk or cream)  Clear Jell-O (without fruit inside)  Italian ices  Avoid red, blue, or purple dyes  Popsicles (without fruit or cream)  Italian ices  Avoid red, blue, or purple dyes THESE ITEMS ARE NOT ALLOWED:    Milk  Cream  Milkshakes  Tomato juice  Orange juice  Cream soups  Any soup other than the listed broths  Oatmeal  Cream of Wheat  Grapefruit juice  Alcohol      Prepare the Golytely solution:  o Mix the powder with water  in the provided plastic container to the 'fill" line and chill in the refrigerator.   o You may add "Crystal Light" powdered lemonade (as an alternative to the flavor packets) to the solution to improve its taste.   At 5 p.m.: Begin 1st dose of the prep solution at a rate of 8 ounces every 15-30 minutes (over 1-2 hours) until half of the prep solution is finished.  o If you feel start to feel full or nauseous, slow down your pace and finish the first half of the solution before midnight.  o Continue to drink clear liquids until you go to bed.                                                       Procedure Day: Split Dose - 2nd half     6 hours prior to your procedure time: Drink the 2nd half of the prep solution.   For example: If your procedure time is 12:00 PM, begin taking your 2nd dose at 6:00 AM.   Drink the solution at a rate of 8 ounces every 15-30 minutes (over 1-2 hours) until you finish the entire   solution.   Please note: You must complete 2nd dose by 4 hours prior to your scheduled start time.   Refrain from consuming any clear liquids, chewing gum, sucking on hard candy/mints and substances such as cigarettes, tobacco,  vape, or marijuana 4 hours prior to procedure.     Preparation Tips  Prepare for induced diarrhea and adjust your schedule accordingly to be near the bathroom.  If  nauseated, take a 20-30-minute break before resuming the preparation slowly; some find walking helpful.  Ensure you finish the entire bowel preparation for effective colon cleaning.  Drink clear liquids throughout the day to prevent dehydration due to the potential diarrhea from the bowel preparation.  After completing the preparation, stool should be clear or yellow.   Call [phone] or [phone] if still seeing solid or brown stool.  Follow Provided Instructions: Strictly adhere to the prep instructions outlined in this document.    NOTE: You must stop ALL intake by mouth at least 4 hours prior to your procedure time - this includes even small sips of water  or other liquids (or your procedure may be cancelled or delayed)

## 2024-03-20 NOTE — Telephone Encounter (Signed)
 PROCEDURE SCHEDULING REQUEST    The following patient needs to be schedule for the procedure listed below:      PATIENT: Judy Aguirre (MRN [id number])      PROCEDURE: EGD + Colonoscopy    DIAGNOSIS: Unintentional weight loss, dysphagia    WITH DOCTOR: Nath    TO BE SCHEDULED AT: [facility] (or any available endoscopy center, including hospital okay)      BOWEL PREP: Golytely sent - non[facility]       IS CARDIOLOGY CLEARANCE TO HOLD MEDS OR MEDICAL RISK STRATIFICATION REQUIRED?: NO      COMMENTS / NOTES / SPECIAL NEEDS FOR PROCEDURE:       Thank you,  Almetta FORBES Soja, FNP

## 2024-03-20 NOTE — Progress Notes (Signed)
 GASTROENTEROLOGY CONSULT NOTE  [facility]  [address]  Phone: [phone], Fax: [phone]      DATE OF VISIT: 03/20/2024  PRIMARY CARE PROVIDER: Azalia Quivers, DO  REFERRING PROVIDER: Azalia Quivers    Verbal consent obtained to record this visit using [facility].     CHIEF COMPLAINT:   Bloated, Abdominal Pain, Weight Loss, and Constipation      HISTORY OF PRESENT ILLNESS:  History of Present Illness  Judy Aguirre is a 43 year old female who presents with unintentional weight loss and abdominal pain.    She has had unintentional weight loss over the past year, from 180 pounds to 157 pounds. She eats but cannot regain weight and feels that pain with eating limits intake. She reports chronic nausea, bloating, abdominal discomfort. She has not used reflux medications. She recently started a probiotic.    She has had persistent abdominal pain for about 18 months. It is mainly right-sided, sometimes radiates to the back, and worsens with tight clothing and bending. Eating often triggers or worsens the pain and leads her to avoid meals. She also has bloating with a marked sense of abdominal distension.    A recent ultrasound showed a benign kidney tumor and ovarian cyst, but no explanation for the pain.    She reports difficulty swallowing, that started over the past year. It occurs especially pills but also with water  and solid food, with a sensation of food getting stuck in the bottom of her esophagus.    For 6 months she has had constipation with bowel movements only every few days and a frequent urge to defecate without being able to. She has noticed a hemorrhoid but no rectal bleeding or black, tarry stools.    She does not use alcohol, tobacco, or drugs. There is no family history of gastrointestinal cancers. She has not had abdominal surgery.    PREVIOUS ENDOSCOPIC/GI STUDIES:  EGD: N/A  Colonoscopy: N/A      REVIEW OF SYSTEMS:  Negative except as stated in the  HPI.    PAST MEDICAL HISTORY  Medical History[1]    PAST SURGICAL HISTORY:  Past Surgical History[2]    CURRENT MEDICATIONS:    Medications Taking[3]    Prescriptions Prior to Admission[4]    ALLERGIES:  Allergies[5]    FAMILY HISTORY:  Family History[6]      SOCIAL HISTORY:  Social History[7]       PHYSICAL EXAM:  BP 100/64 (BP Site: Left arm, Patient Position: Sitting, Cuff Size: Medium)   Pulse 64   Temp 98.2 F (36.8 C) (Oral)   Ht 1.626 m (5' 4)   Wt 71.2 kg (157 lb)   SpO2 100%   BMI 26.95 kg/m   Body mass index is 26.95 kg/m.     General: Well developed and well nourished, no acute distress.  Eyes: Sclera anicteric, conjunctiva normal  HENT:  Nose and ears appear grossly normal.  Chest/Lungs: No respiratory distress or abnormal audible sound.   Heart: Normal rate, regular rhythm.   Abdomen: Soft, non-distended with bowel sounds. No tenderness to palpation. No mass/organomegaly.  Musculoskeletal: No gross deformity.   Extremities:  No edema.  Skin: Warm and dry, no jaundice.  Neurologic: Alert and oriented.  No overt deficits.  Psychiatric: Cooperative, appropriate mood, normal affect.      LABS REVIEWED IN EPIC:  Recent Labs     03/10/24  1655 02/27/24  1324 09/28/23  1355   WBC 9.98* 7.5 8.1  Hemoglobin 13.4 14.3 14.8   Hematocrit 40.8 42.9 46.0   Platelet Count 358*  --   --    Platelets  --  310 268     No results for input(s): PT, INR in the last 56199 hours.  Recent Labs     03/10/24  1655 02/27/24  1324 09/28/23  1355   Sodium 136 140 137   Potassium 3.9 4.1 4.0   Chloride 103 102 100   CO2 24 23 22    BUN 11 9 12    Creatinine 0.7 0.79 0.71   Calcium 8.9 9.0 9.1   Albumin 4.0 4.3 4.6   Protein, Total 7.6 7.2 7.0   Glucose 105* 101* 88      Recent Labs     03/10/24  1655 02/27/24  1324 09/28/23  1355   Bilirubin, Total 0.3 0.5 0.4   Alkaline Phosphatase 62 52 53   ALT 19 14 17    AST (SGOT) 19 14 13           IMAGING/PROCEDURES REVIEWED IN EPIC:  CT Head WO Contrast  Result Date: 03/10/2024    1.No CT evidence of acute intracranial abnormality. 2.Pansinusitis. Sherlean IVAR Flank, MD 03/10/2024 6:41 PM    US  Abdomen Limited Ruq  Result Date: 02/22/2024   1. 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 It is not significantly changed when compared to the CT scan of June 2025. Differences in measurement are likely due to differences in technique. Six-month follow-up recommended to ensure stability. No hydronephrosis. 2. No evidence of cholelithiasis. Normal common duct. 3. No hydronephrosis or ultrasound evidence of stone. 4. Mild hepatomegaly.SABRA Barnie GEANNIE Almeda, MD 02/22/2024 12:43 PM    ASSESSMENT & PLAN:    1. Unintentional weight loss (Primary)  - Reported unintentional weight loss from 180lb to 157lb in one year. CT Abdomen pelvis 10/04/23 unremarkable.  - Recommend EGD and CLN for further evaluation.     2. Dyspepsia  - Chronic abdominal bloating, abdominal discomfort, nausea x18 months.   - Possible etiologies include GERD, esophagitis, h pylori gastritis, PUD, gastroparesis, functional dyspepsia, less likely biliary or pancreatic disease.   - Recommend H Pylori breath test  - Recommend diet and lifestyle modifications including avoidance of eating late, avoidance of GERD trigger foods, elevation of head of bed, etc.   - Recommend PPI trial x8 weeks.    3. Esophageal dysphagia  - Esophageal dysphagia with solids and liquids over the past year with feeling of food getting stuck in her esophagus. No prior EGD.   - The possible etiologies of dysphagia were discussed with patient, including stricture, mass, esophagitis, EoE, motility disorder.  - Recommend EGD with possible dilation.    4. Constipation, unspecified constipation type  - Constipation x6 months with BM every 2-3 days with feeling of incomplete evacuation, likely contributing to abdominal bloat.  - Recommend daily fiber supplementation and Miralax PRN.  - Recommend increased water  intake and daily  exercise.    5. Abdominal bloating  - Plan as above.    Orders Placed This Encounter    Helicobacter pylori Urea Breath Test    polyethylene glycol w/ electrolytes (GOLYTELY) oral solution    pantoprazole (PROTONIX) 40 MG tablet     The risks and benefits of my recommendations, as well as other treatment options were discussed with the patient today. Time was given to ask questions and all questions were answered. The patient expressed understanding and agrees with the plan. Patient  strongly encouraged to set up follow up and reach out regarding any questions.       Almetta FORBES Soja, FNP   Assessment & Plan           [1]   Past Medical History:  Diagnosis Date    Anemia     iron  supplement during third trimester    Angiomyolipoma of right kidney     Breast disorder 2019    benign lump left breast    Breast lump 10/27/2017    Breast lump 10/24/2017    Hyperlipidemia     Seasonal allergic rhinitis    [2]   Past Surgical History:  Procedure Laterality Date    D & E, SUCTION N/A 09/10/2016    Procedure: D & E, SUCTION;  Surgeon: Ladora Therisa Gelineau, MD;  Location: [facility];  Service: Gynecology;  Laterality: N/A;    DILATION AND CURETTAGE OF UTERUS      INDUCED ABORTION      x2    LEEP LLETZ  05/21/2013    Procedure: LEEP LLETZ;  Surgeon: Herbie Cherry, MD;  Location: [facility];  Service: Gynecology;  Laterality: N/A;    VAGINAL DELIVERY  2000   [3]   Outpatient Medications Marked as Taking for the 03/20/24 encounter (Office Visit) with Abed, Mona E, FNP   Medication Sig Dispense Refill    albuterol  sulfate HFA (PROVENTIL ) 108 (90 Base) MCG/ACT inhaler Inhale 2 puffs into the lungs every 4 (four) hours as needed for Wheezing 1 each 0    butalbital -acetaminophen -caffeine  (FIORICET ) 50-325-40 MG per tablet Take 1 tablet by mouth every 4 (four) hours as needed for Headaches 15 tablet 0    fluconazole  (DIFLUCAN ) 150 MG tablet TAKE 1 TABLET BY MOUTH EVERY DAY FOR 3 DAYS      fluticasone  (FLOVENT  HFA) 110 MCG/ACT inhaler  Inhale 1 puff into the lungs 2 (two) times daily 1 each 0    ibuprofen  (ADVIL ) 600 MG tablet Take 1 tablet (600 mg) by mouth every 6 (six) hours as needed for Pain 12 tablet 0    ibuprofen  (ADVIL ) 800 MG tablet       levonorgestrel (MIRENA) 20 MCG/DAY IUD 1 Intra Uterine Device (52 mg) by Intrauterine route once      lidocaine  (LIDODERM ) 5 % Place 1 patch onto the skin in the morning. Remove & Discard patch within 12 hours or as directed by MD. 6 patch 0    montelukast  (SINGULAIR ) 10 MG tablet TAKE 1 TABLET BY MOUTH EVERY DAY NIGHTLY 90 tablet 1     Current Facility-Administered Medications for the 03/20/24 encounter (Office Visit) with Abed, Mona E, FNP   Medication Dose Route Frequency Provider Last Rate Last Admin    acetaminophen  (TYLENOL ) tablet 650 mg  650 mg Oral Once Mahmud, Faiqa, MD       [4] (Not in a hospital admission)  [5]   Allergies  Allergen Reactions    Penicillins Other (See Comments) and Rash     Has patient had a PCN reaction causing immediate rash, facial/tongue/throat swelling, SOB or lightheadedness with hypotension: Yes  Has patient had a PCN reaction causing severe rash involving mucus membranes or skin necrosis: No  Has patient had a PCN reaction that required hospitalization: Yes  Has patient had a PCN reaction occurring within the last 10 years: Yes  If all of the above answers are NO, then may proceed with Cephalosporin use.  Was told by parent she was allergic as  child   [6]   Family History  Problem Relation Name Age of Onset    Breast cancer Mother  6    Cancer Mother      No known problems Father      Breast cancer Paternal Aunt  26        Paternal half-aunt    Cancer Paternal Aunt      Breast cancer Cousin  63        Paternal half-cousin; Pt reports genetic testing was negative, report not reviewed    No known problems Son      Ovarian cancer Neg Hx     [7]   Social History  Tobacco Use    Smoking status: Former     Types: Cigarettes    Smokeless tobacco: Former    Tobacco  comments:     social    Advertising Account Planner    Vaping status: Never Used   Substance Use Topics    Alcohol use: Never    Drug use: No

## 2024-03-26 ENCOUNTER — Telehealth: Payer: Self-pay

## 2024-03-26 NOTE — Telephone Encounter (Signed)
 Copied from CRM [id number]. Topic: Clinical Support - Medical Question  >> Mar 26, 2024  1:03 PM Sulema D wrote:  Judy Aguirre, Judy Aguirre called about Clinical Support - Medical Question.  Additional details:    PT calling in for clarification on what medication she should be taking before lab work and how long to take it before blood work    Pt also calling in to be scheduled for CLN/ EGD     CB: [phone]

## 2024-03-27 NOTE — Progress Notes (Deleted)
 [facility]     {  Disappearing Text  Click a link below to be taken to that activity or part of the chart   Chart Review  Order Review  Review Flowsheets  Labs  Health Maintenance  Immunizations  Allergies  Medications  Problem List  History  Synopsis   :55325}  Telehealth:  The {PatientParentGuardian:[id number]} has given verbal consent for delivery of health care via telehealth.   The patient is located at {Patient Location:210763::Home} in {State:[address]10764::[place] }  The encounter provider is located at their {Provider Location:210765::Medical Office} in {State:210764::[place] }  Epic Video Client was utilized for Real Time/Synchronous Telehealth.   The time spent in medical discussion during this visit was *** minutes.  {Audio Only Visit (Optional):210762}      Subjective   No chief complaint on file.    History of Present Illness    Review of Systems    Objective   There were no vitals taken for this visit.  Physical Exam  Physical Exam       Results        Assessment/Plan     Assessment & Plan      Verbal consent obtained to record this visit.

## 2024-03-27 NOTE — Telephone Encounter (Addendum)
 Called and spoke with patient     Informed patient that she is to obtain H pylori breath test prior to starting Pantoprazole  and that prep was sent to the pharmacy for CLN/EGD procedure to be scheduled     Reviewed prep instructions in detail with patient  Patient verbalized understanding and all further questions were answered  Patient would like scheduling team to reach out to her to schedule procedure

## 2024-03-27 NOTE — Telephone Encounter (Signed)
 Attempted to return call to patient as requested  Left voice message for patient to return call to our office

## 2024-03-27 NOTE — Telephone Encounter (Signed)
 Returned call to patient    Explained to patient that per [facility], patient is to obtain H pylori breath test prior to starting Pantoprazole  and that prep was sent to the pharmacy for CLN/EGD procedure to be scheduled    Patient expressed that she is confused and would like us  to call her back after 1pm to further discuss    Informed patient I would call her back then  Patient had no further questions or concerns

## 2024-03-28 ENCOUNTER — Encounter (INDEPENDENT_AMBULATORY_CARE_PROVIDER_SITE_OTHER): Payer: Self-pay

## 2024-03-28 NOTE — Progress Notes (Signed)
 Patient is scheduled:  Day: Friday   Date: 05/18/24  Time:130pm  Procedure: EGD/CLN  Location: [facility]  Provider: Dr Von  Prep: pt has Golytely  Language english    Notes:  office visit 12/2 Almetta Soja fnp

## 2024-03-28 NOTE — Addendum Note (Signed)
 Addended by: CHRISTIAN, VICKI on: 03/28/2024 11:17 AM     Modules accepted: Orders

## 2024-03-29 ENCOUNTER — Telehealth (INDEPENDENT_AMBULATORY_CARE_PROVIDER_SITE_OTHER): Admitting: Family Medicine

## 2024-04-02 ENCOUNTER — Encounter (INDEPENDENT_AMBULATORY_CARE_PROVIDER_SITE_OTHER): Payer: Self-pay | Admitting: Family Medicine

## 2024-04-02 ENCOUNTER — Telehealth (INDEPENDENT_AMBULATORY_CARE_PROVIDER_SITE_OTHER): Admitting: Family Medicine

## 2024-04-02 DIAGNOSIS — L290 Pruritus ani: Secondary | ICD-10-CM

## 2024-04-02 DIAGNOSIS — F419 Anxiety disorder, unspecified: Secondary | ICD-10-CM

## 2024-04-02 DIAGNOSIS — K649 Unspecified hemorrhoids: Secondary | ICD-10-CM

## 2024-04-02 DIAGNOSIS — Z1231 Encounter for screening mammogram for malignant neoplasm of breast: Secondary | ICD-10-CM

## 2024-04-02 MED ORDER — BUSPIRONE HCL 5 MG PO TABS: 5.0000 mg | ORAL_TABLET | Freq: Two times a day (BID) | ORAL | 0 refills | Status: DC

## 2024-04-02 NOTE — Progress Notes (Signed)
 [facility]       Telehealth:  The Patient has given verbal consent for delivery of health care via telehealth.   The patient is located at Home in [place]   The encounter provider is located at their Medical Office in [place]   Epic Video Client was utilized for Real Time/Synchronous Telehealth.   The time spent in medical discussion during this visit was 5 minutes.        Subjective     Chief Complaint   Patient presents with    Anxiety     History of Present Illness  Judy Aguirre is a 43 year old female who presents with anxiety and numbness in her right arm.    She has persistent anxiety with racing heart and uncontrollable anxious thoughts that interfere with daily life. She felt calm with a medication given in the ER and is seeking a similar option but does not want Zoloft. She notes her father also had anxiety and may have taken medication.     She has new numbness in her right arm that occurs nightly, wakes her from sleep, and began after a fall onto her chest in an elevator over the summer. Each episode takes about 45 minutes to resolve.    She noticed an itchy hemorrhoid in the shower a few weeks ago and has had constipation. She denies blood in the stool.    .  Review of Systems    Objective   There were no vitals taken for this visit.  Physical Exam  Physical Exam       Results  Labs  TSH: Within normal limits  Renal function panel: Within normal limits  Liver function panel: Within normal limits  CBC: Within normal limits      Assessment/Plan     Assessment & Plan  Anxiety disorder  - Prescribed buspirone  5 mg twice daily, may increase to 10 mg three times daily as needed.  - Scheduled follow-up in one month to assess response.    Hemorrhoids with pruritus ani  Pruritus ani with hemorrhoids, no blood in stool. Symptoms worsened by constipation.  - Recommended over-the-counter Anusol  for topical use.  - Advised sitz baths with warm water  and Epsom salts.  - Suggested  flushable wipes and witch hazel wipes.  - Encouraged increased water  intake and fiber supplementation.      Verbal consent obtained to record this visit.

## 2024-04-18 LAB — HELICOBACTER PYLORI UREA BREATH TEST: H. pylori Breath Test: NEGATIVE

## 2024-04-18 LAB — FISH ONLY

## 2024-04-20 ENCOUNTER — Ambulatory Visit (INDEPENDENT_AMBULATORY_CARE_PROVIDER_SITE_OTHER): Payer: Self-pay

## 2024-04-24 ENCOUNTER — Other Ambulatory Visit (INDEPENDENT_AMBULATORY_CARE_PROVIDER_SITE_OTHER): Payer: Self-pay | Admitting: Family Medicine

## 2024-04-24 DIAGNOSIS — F419 Anxiety disorder, unspecified: Secondary | ICD-10-CM

## 2024-05-02 ENCOUNTER — Encounter (INDEPENDENT_AMBULATORY_CARE_PROVIDER_SITE_OTHER): Admitting: Family Medicine

## 2024-05-11 ENCOUNTER — Encounter (INDEPENDENT_AMBULATORY_CARE_PROVIDER_SITE_OTHER): Payer: Self-pay

## 2024-05-11 ENCOUNTER — Ambulatory Visit (INDEPENDENT_AMBULATORY_CARE_PROVIDER_SITE_OTHER)

## 2024-05-11 NOTE — PSS Phone Screening (Signed)
 Documentation already in chart:  LOV-encounter 02/13/2024    Pre-Anesthesia Evaluation    Pre-op phone visit requested by: Von Cough, MD  Reason for pre-op phone visit: Patient anticipating COLONOSCOPY, SCREENING, ESOPHAGOGASTRODUODENOSCOPY (EGD), SCREENING procedure.    Language Assistant  Interpreter: N/A - English is preferred language    No orders of the defined types were placed in this encounter.      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   08/27/2016     Breast lump N63.0   10/24/2017     Abnormal findings on diagnostic imaging of breast R92.8   11/14/2017     Family history of malignant neoplasm of breast Z80.3   11/14/2017     Vaginal discharge during pregnancy in third trimester O26.893, N89.8   08/22/2019     Encounter for screening for malignant neoplasm of breast, unspecified screening modality Z12.39   10/09/2020     Unintentional weight loss R63.4   06/23/2022     Enlarged thyroid  E04.9   06/23/2022     Mass of joint of right shoulder M25.811   09/01/2022     Dyspepsia R10.13   03/20/2024     Esophageal dysphagia R13.19   03/20/2024     Constipation, unspecified constipation type K59.00   03/20/2024     Abdominal bloating R14.0   03/20/2024         Medical History   Diagnosis Date    Anemia     iron  supplement during third trimester    Angiomyolipoma of right kidney     Breast disorder 2019    benign lump left breast    Breast lump 10/27/2017    Breast lump 10/24/2017    Hyperlipidemia     Seasonal allergic rhinitis      Past Surgical History[1]     Medication  List            Accurate as of May 11, 2024 10:33 AM. Always use your most recent med list.                albuterol  sulfate HFA 108 (90 Base) MCG/ACT inhaler  Inhale 2 puffs into the lungs every 4 (four) hours as needed for Wheezing  Commonly known as: PROVENTIL   Medication Adjustments for Surgery: Take morning of surgery     busPIRone  5 MG tablet  TAKE 1 TABLET (5 MG) BY MOUTH TWICE A DAY  Commonly known as: BUSPAR   Medication Adjustments for Surgery: Take as prescribed     butalbital -acetaminophen -caffeine  50-325-40 MG per tablet  Take 1 tablet by mouth every 4 (four) hours as needed for Headaches  Commonly known as: FIORICET   Medication Adjustments for Surgery: Take as needed     cetirizine 10 MG tablet  Take 1 tablet (10 mg) by mouth as needed  Commonly known as: ZyrTEC  Medication Adjustments for Surgery: Take as needed  fluticasone  110 MCG/ACT inhaler  Inhale 1 puff into the lungs 2 (two) times daily  Commonly known as: FLOVENT  HFA  Medication Adjustments for Surgery: Take as prescribed     ibuprofen  600 MG tablet  Take 1 tablet (600 mg) by mouth every 6 (six) hours as needed for Pain  Commonly known as: ADVIL   Medication Adjustments for Surgery: Last dose 24 hours before surgery     levonorgestrel 20 MCG/DAY IUD  1 Intra Uterine Device (52 mg) by Intrauterine route once  Commonly known as: MIRENA     lidocaine  5 %  Place 1 patch onto the skin in the morning. Remove & Discard patch within 12 hours or as directed by MD.  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 (Menstrual status: IUD).    Tubal Ligation?  No valid surgical or medical questions entered.               Exam Scores:   SDB score           STBUR score       PONV score  Nausea Risk: SEVERE RISK    MST score  MST Score: 0    PEN-FAST score       Frailty score       CHADsVasc            Visit Vitals  Ht 1.702 m (5' 7)   Wt 70.3 kg (155 lb)   LMP  (LMP Unknown)   BMI 24.28 kg/m        Recent Labs   CBC (last 180 days) 02/27/24  1324 03/10/24  1655   WBC 7.5 9.98*   RBC 4.77 4.57   Hemoglobin 14.3 13.4   Hematocrit 42.9 40.8   MCV 90 89.3   MCH 30.0 29.3   MCHC 33.3 32.8   RDW 12.0 13   Platelet Count  --  358*   Platelets 310  --    MPV  --  8.7*   nRBC %  --  0.0   Absolute nRBC  --  0.00     Recent Labs   BMP (last 180 days) 02/27/24  1324 03/10/24  1655   Glucose 101* 105*   BUN 9 11   Creatinine 0.79 0.7   Sodium 140 136   Potassium 4.1 3.9   Chloride 102 103   CO2 23 24   Calcium 9.0 8.9   Anion Gap  --  9.0   GFR  --  >60.0   eGFR 96  --  Recent Labs   Other (last 180 days) 02/27/24  1324 03/10/24  1655 03/10/24  1815   TSH  --  0.82  --    Bilirubin, Total 0.5 0.3  --    ALT 14 19  --    AST (SGOT) 14 19  --    Protein, Total 7.2 7.6  --    SARS-CoV-2 (COVID-19) RNA  --   --  Not Detected                        [1]   Past Surgical History:  Procedure Laterality Date    D & E, SUCTION N/A 09/10/2016    Procedure: D & E, SUCTION;  Surgeon: Ladora Therisa Gelineau, MD;  Location: [facility];  Service: Gynecology;  Laterality: N/A;    DILATION AND CURETTAGE OF UTERUS      INDUCED ABORTION      x2    LEEP LLETZ  05/21/2013    Procedure: LEEP LLETZ;  Surgeon: Herbie Cherry, MD;  Location: [facility];  Service: Gynecology;  Laterality: N/A;    VAGINAL DELIVERY  2000   [2]    Allergies  Allergen Reactions    Penicillins Rash and Other (See Comments)   [3]   Family History  Problem Relation Name Age of Onset    Breast cancer Mother  52    Cancer Mother      No known problems Father      Breast cancer Paternal Aunt  70        Paternal half-aunt    Cancer Paternal Aunt      Breast cancer Cousin  70        Paternal half-cousin; Pt reports genetic testing was negative, report not reviewed    No known problems Son      Ovarian cancer Neg Hx

## 2024-05-17 ENCOUNTER — Telehealth (INDEPENDENT_AMBULATORY_CARE_PROVIDER_SITE_OTHER): Payer: Self-pay

## 2024-05-17 NOTE — Telephone Encounter (Signed)
 Hospital procedure time change

## 2024-05-18 ENCOUNTER — Encounter: Admission: RE | Payer: Self-pay | Source: Ambulatory Visit

## 2024-05-18 ENCOUNTER — Encounter (INDEPENDENT_AMBULATORY_CARE_PROVIDER_SITE_OTHER): Payer: Self-pay

## 2024-05-18 ENCOUNTER — Ambulatory Visit: Admission: RE | Admit: 2024-05-18 | Source: Ambulatory Visit | Admitting: Gastroenterology

## 2024-05-18 DIAGNOSIS — R634 Abnormal weight loss: Secondary | ICD-10-CM | POA: Insufficient documentation

## 2024-05-18 DIAGNOSIS — K5900 Constipation, unspecified: Secondary | ICD-10-CM | POA: Insufficient documentation

## 2024-05-18 DIAGNOSIS — R1013 Epigastric pain: Secondary | ICD-10-CM | POA: Insufficient documentation

## 2024-05-18 DIAGNOSIS — R1319 Other dysphagia: Secondary | ICD-10-CM | POA: Insufficient documentation

## 2024-05-18 DIAGNOSIS — R140 Abdominal distension (gaseous): Secondary | ICD-10-CM | POA: Insufficient documentation

## 2024-05-18 NOTE — Addendum Note (Signed)
 Addended by: DELORES ROSELLA R on: 05/18/2024 11:52 AM     Modules accepted: Orders

## 2024-05-18 NOTE — Progress Notes (Signed)
 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

## 2024-06-12 ENCOUNTER — Encounter: Admission: RE | Payer: Self-pay

## 2024-06-12 ENCOUNTER — Ambulatory Visit: Admission: RE | Admit: 2024-06-12

## 2024-06-12 DIAGNOSIS — R1319 Other dysphagia: Secondary | ICD-10-CM | POA: Insufficient documentation

## 2024-06-12 DIAGNOSIS — K5900 Constipation, unspecified: Secondary | ICD-10-CM | POA: Insufficient documentation

## 2024-06-12 DIAGNOSIS — R1013 Epigastric pain: Secondary | ICD-10-CM | POA: Insufficient documentation

## 2024-06-12 DIAGNOSIS — R140 Abdominal distension (gaseous): Secondary | ICD-10-CM | POA: Insufficient documentation

## 2024-06-12 DIAGNOSIS — R634 Abnormal weight loss: Secondary | ICD-10-CM | POA: Insufficient documentation
